# Patient Record
Sex: Female | Born: 1940
Health system: Southern US, Community
[De-identification: ages and names within clinical notes are randomized; demographics above are authoritative.]

## PROBLEM LIST (undated history)

## (undated) DIAGNOSIS — I10 Essential (primary) hypertension: Secondary | ICD-10-CM

## (undated) DIAGNOSIS — H409 Unspecified glaucoma: Secondary | ICD-10-CM

## (undated) DIAGNOSIS — N183 Chronic kidney disease, stage 3 unspecified: Secondary | ICD-10-CM

## (undated) DIAGNOSIS — C649 Malignant neoplasm of unspecified kidney, except renal pelvis: Secondary | ICD-10-CM

## (undated) DIAGNOSIS — D126 Benign neoplasm of colon, unspecified: Secondary | ICD-10-CM

## (undated) DIAGNOSIS — E039 Hypothyroidism, unspecified: Secondary | ICD-10-CM

## (undated) DIAGNOSIS — E662 Morbid (severe) obesity with alveolar hypoventilation: Secondary | ICD-10-CM

## (undated) DIAGNOSIS — E669 Obesity, unspecified: Secondary | ICD-10-CM

## (undated) DIAGNOSIS — F32A Depression, unspecified: Secondary | ICD-10-CM

## (undated) DIAGNOSIS — F329 Major depressive disorder, single episode, unspecified: Secondary | ICD-10-CM

## (undated) HISTORY — DX: Major depressive disorder, single episode, unspecified: F32.9

## (undated) HISTORY — DX: Hypothyroidism, unspecified: E03.9

## (undated) HISTORY — PX: TOTAL ABDOMINAL HYSTERECTOMY: SHX209

## (undated) HISTORY — DX: Morbid (severe) obesity with alveolar hypoventilation: E66.2

## (undated) HISTORY — DX: Benign neoplasm of colon, unspecified: D12.6

## (undated) HISTORY — PX: JOINT REPLACEMENT: SHX530

## (undated) HISTORY — DX: Depression, unspecified: F32.A

## (undated) HISTORY — DX: Chronic kidney disease, stage 3 unspecified: N18.30

## (undated) HISTORY — DX: Obesity, unspecified: E66.9

## (undated) HISTORY — PX: NEPHRECTOMY: SHX65

## (undated) HISTORY — DX: Unspecified glaucoma: H40.9

## (undated) HISTORY — DX: Chronic kidney disease, stage 3 (moderate): N18.3

## (undated) HISTORY — PX: KIDNEY SURGERY: SHX687

## (undated) HISTORY — DX: Malignant neoplasm of unspecified kidney, except renal pelvis: C64.9

## (undated) HISTORY — DX: Essential (primary) hypertension: I10

## (undated) HISTORY — PX: REPLACEMENT TOTAL KNEE: SUR1224

---

## 2004-01-15 ENCOUNTER — Ambulatory Visit (HOSPITAL_COMMUNITY): Admission: RE | Admit: 2004-01-15 | Discharge: 2004-01-15 | Payer: Self-pay | Admitting: Neurosurgery

## 2004-01-26 ENCOUNTER — Encounter: Admission: RE | Admit: 2004-01-26 | Discharge: 2004-01-26 | Payer: Self-pay | Admitting: Neurosurgery

## 2004-02-09 ENCOUNTER — Encounter: Admission: RE | Admit: 2004-02-09 | Discharge: 2004-02-09 | Payer: Self-pay | Admitting: Neurosurgery

## 2004-04-12 ENCOUNTER — Encounter: Admission: RE | Admit: 2004-04-12 | Discharge: 2004-04-12 | Payer: Self-pay | Admitting: Neurosurgery

## 2004-06-07 ENCOUNTER — Ambulatory Visit (HOSPITAL_COMMUNITY): Admission: RE | Admit: 2004-06-07 | Discharge: 2004-06-07 | Payer: Self-pay | Admitting: Family Medicine

## 2004-06-08 ENCOUNTER — Emergency Department (HOSPITAL_COMMUNITY): Admission: EM | Admit: 2004-06-08 | Discharge: 2004-06-09 | Payer: Self-pay | Admitting: Emergency Medicine

## 2004-06-10 ENCOUNTER — Emergency Department (HOSPITAL_COMMUNITY): Admission: EM | Admit: 2004-06-10 | Discharge: 2004-06-10 | Payer: Self-pay | Admitting: Emergency Medicine

## 2006-02-06 ENCOUNTER — Encounter: Admission: RE | Admit: 2006-02-06 | Discharge: 2006-02-06 | Payer: Self-pay | Admitting: Orthopedic Surgery

## 2006-07-06 ENCOUNTER — Emergency Department (HOSPITAL_COMMUNITY): Admission: EM | Admit: 2006-07-06 | Discharge: 2006-07-06 | Payer: Self-pay | Admitting: Emergency Medicine

## 2006-07-15 ENCOUNTER — Encounter: Admission: RE | Admit: 2006-07-15 | Discharge: 2006-07-15 | Payer: Self-pay | Admitting: Neurosurgery

## 2006-07-18 ENCOUNTER — Encounter: Admission: RE | Admit: 2006-07-18 | Discharge: 2006-07-18 | Payer: Self-pay | Admitting: Neurosurgery

## 2006-07-24 ENCOUNTER — Ambulatory Visit: Payer: Self-pay | Admitting: Hematology and Oncology

## 2006-07-28 LAB — CBC WITH DIFFERENTIAL/PLATELET
BASO%: 1.1 % (ref 0.0–2.0)
Basophils Absolute: 0.1 10*3/uL (ref 0.0–0.1)
EOS%: 2.5 % (ref 0.0–7.0)
Eosinophils Absolute: 0.2 10*3/uL (ref 0.0–0.5)
HCT: 43.1 % (ref 34.8–46.6)
HGB: 14.4 g/dL (ref 11.6–15.9)
LYMPH%: 38.8 % (ref 14.0–48.0)
MCH: 30.9 pg (ref 26.0–34.0)
MCHC: 33.4 g/dL (ref 32.0–36.0)
MCV: 92.3 fL (ref 81.0–101.0)
MONO#: 0.5 10*3/uL (ref 0.1–0.9)
MONO%: 7.2 % (ref 0.0–13.0)
NEUT#: 3.3 10*3/uL (ref 1.5–6.5)
NEUT%: 50.4 % (ref 39.6–76.8)
Platelets: 319 10*3/uL (ref 145–400)
RBC: 4.67 10*6/uL (ref 3.70–5.32)
RDW: 13.8 % (ref 11.3–14.5)
WBC: 6.5 10*3/uL (ref 3.9–10.0)
lymph#: 2.5 10*3/uL (ref 0.9–3.3)

## 2006-07-28 LAB — COMPREHENSIVE METABOLIC PANEL WITH GFR
ALT: 18 U/L (ref 0–40)
AST: 16 U/L (ref 0–37)
Albumin: 4.3 g/dL (ref 3.5–5.2)
Alkaline Phosphatase: 72 U/L (ref 39–117)
BUN: 19 mg/dL (ref 6–23)
CO2: 24 meq/L (ref 19–32)
Calcium: 9.6 mg/dL (ref 8.4–10.5)
Chloride: 107 meq/L (ref 96–112)
Creatinine, Ser: 0.86 mg/dL (ref 0.40–1.20)
Glucose, Bld: 142 mg/dL — ABNORMAL HIGH (ref 70–99)
Potassium: 4 meq/L (ref 3.5–5.3)
Sodium: 142 meq/L (ref 135–145)
Total Bilirubin: 0.5 mg/dL (ref 0.3–1.2)
Total Protein: 6.7 g/dL (ref 6.0–8.3)

## 2006-07-28 LAB — URINALYSIS, MICROSCOPIC - CHCC
Bilirubin (Urine): NEGATIVE
Blood: NEGATIVE
Glucose: NEGATIVE g/dL
RBC count: NEGATIVE (ref 0–2)

## 2007-04-18 ENCOUNTER — Ambulatory Visit: Payer: Self-pay | Admitting: Hematology & Oncology

## 2007-09-27 ENCOUNTER — Ambulatory Visit: Payer: Self-pay | Admitting: Hematology & Oncology

## 2007-10-03 ENCOUNTER — Encounter: Admission: RE | Admit: 2007-10-03 | Discharge: 2007-10-03 | Payer: Self-pay | Admitting: Hematology & Oncology

## 2007-11-01 ENCOUNTER — Emergency Department (HOSPITAL_COMMUNITY): Admission: EM | Admit: 2007-11-01 | Discharge: 2007-11-01 | Payer: Self-pay | Admitting: Emergency Medicine

## 2008-06-01 ENCOUNTER — Encounter: Admission: RE | Admit: 2008-06-01 | Discharge: 2008-06-01 | Payer: Self-pay | Admitting: Neurosurgery

## 2008-07-03 ENCOUNTER — Encounter: Admission: RE | Admit: 2008-07-03 | Discharge: 2008-07-03 | Payer: Self-pay | Admitting: Neurosurgery

## 2009-01-06 ENCOUNTER — Encounter: Admission: RE | Admit: 2009-01-06 | Discharge: 2009-01-06 | Payer: Self-pay | Admitting: Neurosurgery

## 2009-03-30 ENCOUNTER — Encounter: Admission: RE | Admit: 2009-03-30 | Discharge: 2009-03-30 | Payer: Self-pay | Admitting: Neurosurgery

## 2009-05-13 ENCOUNTER — Encounter: Admission: RE | Admit: 2009-05-13 | Discharge: 2009-05-13 | Payer: Self-pay | Admitting: Neurosurgery

## 2009-12-29 ENCOUNTER — Ambulatory Visit: Payer: Self-pay | Admitting: Hematology & Oncology

## 2009-12-30 LAB — COMPREHENSIVE METABOLIC PANEL
ALT: 16 U/L (ref 0–35)
AST: 15 U/L (ref 0–37)
Albumin: 4.1 g/dL (ref 3.5–5.2)
CO2: 23 mEq/L (ref 19–32)
Calcium: 9.4 mg/dL (ref 8.4–10.5)
Chloride: 106 mEq/L (ref 96–112)
Potassium: 4.6 mEq/L (ref 3.5–5.3)
Sodium: 142 mEq/L (ref 135–145)
Total Protein: 6.8 g/dL (ref 6.0–8.3)

## 2009-12-30 LAB — CBC WITH DIFFERENTIAL (CANCER CENTER ONLY)
BASO%: 0.5 % (ref 0.0–2.0)
EOS%: 4.2 % (ref 0.0–7.0)
HCT: 44.1 % (ref 34.8–46.6)
LYMPH#: 2.4 10*3/uL (ref 0.9–3.3)
MCHC: 32.9 g/dL (ref 32.0–36.0)
MONO#: 0.4 10*3/uL (ref 0.1–0.9)
NEUT#: 3.3 10*3/uL (ref 1.5–6.5)
NEUT%: 52.4 % (ref 39.6–80.0)
RDW: 12.4 % (ref 10.5–14.6)
WBC: 6.4 10*3/uL (ref 3.9–10.0)

## 2009-12-30 LAB — LACTATE DEHYDROGENASE: LDH: 131 U/L (ref 94–250)

## 2010-01-01 ENCOUNTER — Inpatient Hospital Stay (HOSPITAL_COMMUNITY): Admission: EM | Admit: 2010-01-01 | Discharge: 2010-01-07 | Payer: Self-pay | Admitting: Emergency Medicine

## 2010-12-05 ENCOUNTER — Encounter: Payer: Self-pay | Admitting: Neurosurgery

## 2010-12-05 ENCOUNTER — Encounter: Payer: Self-pay | Admitting: Orthopedic Surgery

## 2010-12-06 ENCOUNTER — Encounter: Payer: Self-pay | Admitting: Neurosurgery

## 2011-01-18 ENCOUNTER — Other Ambulatory Visit: Payer: Self-pay | Admitting: Neurosurgery

## 2011-01-18 DIAGNOSIS — M542 Cervicalgia: Secondary | ICD-10-CM

## 2011-01-18 DIAGNOSIS — M545 Low back pain: Secondary | ICD-10-CM

## 2011-01-28 ENCOUNTER — Ambulatory Visit
Admission: RE | Admit: 2011-01-28 | Discharge: 2011-01-28 | Disposition: A | Discharge status: Home / Self Care | Payer: Medicare Other | Source: Ambulatory Visit | Attending: Neurosurgery | Admitting: Neurosurgery

## 2011-01-28 DIAGNOSIS — M545 Low back pain: Secondary | ICD-10-CM

## 2011-01-28 DIAGNOSIS — M542 Cervicalgia: Secondary | ICD-10-CM

## 2011-02-03 LAB — CBC
HCT: 32.7 % — ABNORMAL LOW (ref 36.0–46.0)
HCT: 39.9 % (ref 36.0–46.0)
Hemoglobin: 10 g/dL — ABNORMAL LOW (ref 12.0–15.0)
Hemoglobin: 10.9 g/dL — ABNORMAL LOW (ref 12.0–15.0)
Hemoglobin: 11.5 g/dL — ABNORMAL LOW (ref 12.0–15.0)
MCHC: 33.2 g/dL (ref 30.0–36.0)
MCHC: 33.3 g/dL (ref 30.0–36.0)
MCV: 93.6 fL (ref 78.0–100.0)
Platelets: 222 10*3/uL (ref 150–400)
Platelets: 242 10*3/uL (ref 150–400)
RBC: 3.49 MIL/uL — ABNORMAL LOW (ref 3.87–5.11)
RBC: 3.66 MIL/uL — ABNORMAL LOW (ref 3.87–5.11)
RDW: 13.4 % (ref 11.5–15.5)
RDW: 14 % (ref 11.5–15.5)
RDW: 14.1 % (ref 11.5–15.5)
WBC: 12.1 10*3/uL — ABNORMAL HIGH (ref 4.0–10.5)
WBC: 13.8 10*3/uL — ABNORMAL HIGH (ref 4.0–10.5)

## 2011-02-03 LAB — BASIC METABOLIC PANEL
BUN: 12 mg/dL (ref 6–23)
BUN: 14 mg/dL (ref 6–23)
CO2: 26 mEq/L (ref 19–32)
CO2: 26 mEq/L (ref 19–32)
Calcium: 8.3 mg/dL — ABNORMAL LOW (ref 8.4–10.5)
Calcium: 8.4 mg/dL (ref 8.4–10.5)
Creatinine, Ser: 0.77 mg/dL (ref 0.4–1.2)
GFR calc non Af Amer: >60 mL/min (ref >60)
GFR calc non Af Amer: >60 mL/min (ref >60)
Glucose, Bld: 108 mg/dL — ABNORMAL HIGH (ref 70–99)
Glucose, Bld: 129 mg/dL — ABNORMAL HIGH (ref 70–99)
Glucose, Bld: 145 mg/dL — ABNORMAL HIGH (ref 70–99)
Potassium: 4.3 mEq/L (ref 3.5–5.1)
Potassium: 4.5 mEq/L (ref 3.5–5.1)
Potassium: 4.6 mEq/L (ref 3.5–5.1)
Sodium: 132 mEq/L — ABNORMAL LOW (ref 135–145)
Sodium: 137 mEq/L (ref 135–145)

## 2011-02-03 LAB — POCT I-STAT, CHEM 8
Calcium, Ion: 1.15 mmol/L (ref 1.12–1.32)
Chloride: 109 mEq/L (ref 96–112)
Glucose, Bld: 148 mg/dL — ABNORMAL HIGH (ref 70–99)
HCT: 48 % — ABNORMAL HIGH (ref 36.0–46.0)
TCO2: 26 mmol/L (ref 0–100)

## 2011-02-03 LAB — DIFFERENTIAL
Basophils Absolute: 0 10*3/uL (ref 0.0–0.1)
Eosinophils Absolute: 0 10*3/uL (ref 0.0–0.7)
Eosinophils Relative: 0 % (ref 0–5)
Lymphocytes Relative: 5 % — ABNORMAL LOW (ref 12–46)
Lymphs Abs: 0.7 10*3/uL (ref 0.7–4.0)
Neutrophils Relative %: 92 % — ABNORMAL HIGH (ref 43–77)

## 2011-04-04 ENCOUNTER — Ambulatory Visit: Payer: Medicare Other | Admitting: Physical Therapy

## 2011-04-05 ENCOUNTER — Ambulatory Visit: Payer: Medicare Other | Attending: Orthopedic Surgery | Admitting: Physical Therapy

## 2011-04-05 DIAGNOSIS — M546 Pain in thoracic spine: Secondary | ICD-10-CM | POA: Insufficient documentation

## 2011-04-05 DIAGNOSIS — IMO0001 Reserved for inherently not codable concepts without codable children: Secondary | ICD-10-CM | POA: Insufficient documentation

## 2011-04-05 DIAGNOSIS — M25669 Stiffness of unspecified knee, not elsewhere classified: Secondary | ICD-10-CM | POA: Insufficient documentation

## 2011-04-05 DIAGNOSIS — Z96659 Presence of unspecified artificial knee joint: Secondary | ICD-10-CM | POA: Insufficient documentation

## 2011-04-05 DIAGNOSIS — M25569 Pain in unspecified knee: Secondary | ICD-10-CM | POA: Insufficient documentation

## 2011-04-06 ENCOUNTER — Ambulatory Visit: Payer: Medicare Other | Admitting: Rehabilitation

## 2011-04-08 ENCOUNTER — Ambulatory Visit: Payer: Medicare Other | Admitting: Physical Therapy

## 2011-04-12 ENCOUNTER — Ambulatory Visit: Payer: Medicare Other | Admitting: Rehabilitation

## 2011-04-12 ENCOUNTER — Ambulatory Visit: Payer: Medicare Other | Admitting: Physical Therapy

## 2011-04-14 ENCOUNTER — Ambulatory Visit: Payer: Medicare Other | Admitting: Physical Therapy

## 2011-04-15 ENCOUNTER — Encounter: Payer: Medicare Other | Admitting: Physical Therapy

## 2011-04-15 ENCOUNTER — Ambulatory Visit: Payer: Medicare Other | Attending: Neurosurgery | Admitting: Physical Therapy

## 2011-04-15 DIAGNOSIS — M25569 Pain in unspecified knee: Secondary | ICD-10-CM | POA: Insufficient documentation

## 2011-04-15 DIAGNOSIS — M25669 Stiffness of unspecified knee, not elsewhere classified: Secondary | ICD-10-CM | POA: Insufficient documentation

## 2011-04-15 DIAGNOSIS — IMO0001 Reserved for inherently not codable concepts without codable children: Secondary | ICD-10-CM | POA: Insufficient documentation

## 2011-04-15 DIAGNOSIS — M546 Pain in thoracic spine: Secondary | ICD-10-CM | POA: Insufficient documentation

## 2011-04-15 DIAGNOSIS — Z96659 Presence of unspecified artificial knee joint: Secondary | ICD-10-CM | POA: Insufficient documentation

## 2011-04-18 ENCOUNTER — Ambulatory Visit: Payer: Medicare Other | Admitting: Physical Therapy

## 2011-04-18 DIAGNOSIS — M2569 Stiffness of other specified joint, not elsewhere classified: Secondary | ICD-10-CM | POA: Insufficient documentation

## 2011-04-18 DIAGNOSIS — IMO0001 Reserved for inherently not codable concepts without codable children: Secondary | ICD-10-CM | POA: Insufficient documentation

## 2011-04-18 DIAGNOSIS — M542 Cervicalgia: Secondary | ICD-10-CM | POA: Insufficient documentation

## 2011-04-18 DIAGNOSIS — M545 Low back pain, unspecified: Secondary | ICD-10-CM | POA: Insufficient documentation

## 2011-04-20 ENCOUNTER — Ambulatory Visit: Payer: Medicare Other | Admitting: Physical Therapy

## 2011-04-22 ENCOUNTER — Ambulatory Visit: Payer: Medicare Other | Admitting: Physical Therapy

## 2011-04-22 DIAGNOSIS — Z96659 Presence of unspecified artificial knee joint: Secondary | ICD-10-CM | POA: Insufficient documentation

## 2011-04-22 DIAGNOSIS — M545 Low back pain, unspecified: Secondary | ICD-10-CM | POA: Insufficient documentation

## 2011-04-22 DIAGNOSIS — M25669 Stiffness of unspecified knee, not elsewhere classified: Secondary | ICD-10-CM | POA: Insufficient documentation

## 2011-04-22 DIAGNOSIS — IMO0001 Reserved for inherently not codable concepts without codable children: Secondary | ICD-10-CM | POA: Insufficient documentation

## 2011-04-22 DIAGNOSIS — M542 Cervicalgia: Secondary | ICD-10-CM | POA: Insufficient documentation

## 2011-04-22 DIAGNOSIS — M25569 Pain in unspecified knee: Secondary | ICD-10-CM | POA: Insufficient documentation

## 2011-04-22 DIAGNOSIS — M2569 Stiffness of other specified joint, not elsewhere classified: Secondary | ICD-10-CM | POA: Insufficient documentation

## 2011-04-22 DIAGNOSIS — M546 Pain in thoracic spine: Secondary | ICD-10-CM | POA: Insufficient documentation

## 2011-04-25 ENCOUNTER — Ambulatory Visit: Payer: Medicare Other | Admitting: Physical Therapy

## 2011-04-25 DIAGNOSIS — M2569 Stiffness of other specified joint, not elsewhere classified: Secondary | ICD-10-CM | POA: Insufficient documentation

## 2011-04-25 DIAGNOSIS — M545 Low back pain, unspecified: Secondary | ICD-10-CM | POA: Insufficient documentation

## 2011-04-25 DIAGNOSIS — M542 Cervicalgia: Secondary | ICD-10-CM | POA: Insufficient documentation

## 2011-04-25 DIAGNOSIS — IMO0001 Reserved for inherently not codable concepts without codable children: Secondary | ICD-10-CM | POA: Insufficient documentation

## 2011-04-27 ENCOUNTER — Ambulatory Visit: Payer: Medicare Other | Admitting: Physical Therapy

## 2011-04-27 ENCOUNTER — Encounter: Payer: Self-pay | Admitting: Physical Therapy

## 2011-04-29 ENCOUNTER — Ambulatory Visit: Payer: Medicare Other | Admitting: Physical Therapy

## 2011-04-29 ENCOUNTER — Encounter: Payer: Self-pay | Admitting: Physical Therapy

## 2011-05-02 ENCOUNTER — Encounter: Payer: Medicare Other | Admitting: Physical Therapy

## 2011-05-02 ENCOUNTER — Ambulatory Visit: Payer: Medicare Other | Admitting: Physical Therapy

## 2011-05-03 ENCOUNTER — Ambulatory Visit: Payer: Medicare Other | Admitting: Physical Therapy

## 2011-05-04 ENCOUNTER — Encounter: Payer: Medicare Other | Admitting: Physical Therapy

## 2011-05-06 ENCOUNTER — Ambulatory Visit: Payer: Medicare Other | Admitting: Physical Therapy

## 2011-05-06 DIAGNOSIS — M545 Low back pain, unspecified: Secondary | ICD-10-CM | POA: Insufficient documentation

## 2011-05-06 DIAGNOSIS — M2569 Stiffness of other specified joint, not elsewhere classified: Secondary | ICD-10-CM | POA: Insufficient documentation

## 2011-05-06 DIAGNOSIS — IMO0001 Reserved for inherently not codable concepts without codable children: Secondary | ICD-10-CM | POA: Insufficient documentation

## 2011-05-06 DIAGNOSIS — M542 Cervicalgia: Secondary | ICD-10-CM | POA: Insufficient documentation

## 2011-11-28 DIAGNOSIS — T84498A Other mechanical complication of other internal orthopedic devices, implants and grafts, initial encounter: Secondary | ICD-10-CM | POA: Diagnosis not present

## 2011-12-12 DIAGNOSIS — I10 Essential (primary) hypertension: Secondary | ICD-10-CM | POA: Diagnosis not present

## 2012-01-06 DIAGNOSIS — H35359 Cystoid macular degeneration, unspecified eye: Secondary | ICD-10-CM | POA: Diagnosis not present

## 2012-01-09 DIAGNOSIS — K648 Other hemorrhoids: Secondary | ICD-10-CM | POA: Diagnosis not present

## 2012-01-09 DIAGNOSIS — D126 Benign neoplasm of colon, unspecified: Secondary | ICD-10-CM | POA: Diagnosis not present

## 2012-01-09 DIAGNOSIS — Z86010 Personal history of colonic polyps: Secondary | ICD-10-CM | POA: Diagnosis not present

## 2012-01-09 DIAGNOSIS — Z1211 Encounter for screening for malignant neoplasm of colon: Secondary | ICD-10-CM | POA: Diagnosis not present

## 2012-01-30 DIAGNOSIS — D500 Iron deficiency anemia secondary to blood loss (chronic): Secondary | ICD-10-CM | POA: Diagnosis not present

## 2012-01-30 DIAGNOSIS — E039 Hypothyroidism, unspecified: Secondary | ICD-10-CM | POA: Diagnosis not present

## 2012-01-30 DIAGNOSIS — B372 Candidiasis of skin and nail: Secondary | ICD-10-CM | POA: Diagnosis not present

## 2012-01-30 DIAGNOSIS — L538 Other specified erythematous conditions: Secondary | ICD-10-CM | POA: Diagnosis not present

## 2012-01-30 DIAGNOSIS — L659 Nonscarring hair loss, unspecified: Secondary | ICD-10-CM | POA: Diagnosis not present

## 2012-02-14 DIAGNOSIS — I10 Essential (primary) hypertension: Secondary | ICD-10-CM | POA: Diagnosis not present

## 2012-03-12 DIAGNOSIS — B372 Candidiasis of skin and nail: Secondary | ICD-10-CM | POA: Diagnosis not present

## 2012-03-12 DIAGNOSIS — E039 Hypothyroidism, unspecified: Secondary | ICD-10-CM | POA: Diagnosis not present

## 2012-03-12 DIAGNOSIS — L259 Unspecified contact dermatitis, unspecified cause: Secondary | ICD-10-CM | POA: Diagnosis not present

## 2012-09-20 DIAGNOSIS — I10 Essential (primary) hypertension: Secondary | ICD-10-CM | POA: Diagnosis not present

## 2012-09-20 DIAGNOSIS — Z23 Encounter for immunization: Secondary | ICD-10-CM | POA: Diagnosis not present

## 2012-09-20 DIAGNOSIS — R109 Unspecified abdominal pain: Secondary | ICD-10-CM | POA: Diagnosis not present

## 2012-09-20 DIAGNOSIS — N183 Chronic kidney disease, stage 3 unspecified: Secondary | ICD-10-CM | POA: Diagnosis not present

## 2012-10-02 DIAGNOSIS — H409 Unspecified glaucoma: Secondary | ICD-10-CM | POA: Diagnosis not present

## 2012-10-02 DIAGNOSIS — H2510 Age-related nuclear cataract, unspecified eye: Secondary | ICD-10-CM | POA: Diagnosis not present

## 2012-10-02 DIAGNOSIS — H2512 Age-related nuclear cataract, left eye: Secondary | ICD-10-CM | POA: Insufficient documentation

## 2012-10-02 DIAGNOSIS — H401490 Capsular glaucoma with pseudoexfoliation of lens, unspecified eye, stage unspecified: Secondary | ICD-10-CM | POA: Insufficient documentation

## 2012-12-04 DIAGNOSIS — S63509A Unspecified sprain of unspecified wrist, initial encounter: Secondary | ICD-10-CM | POA: Diagnosis not present

## 2012-12-04 DIAGNOSIS — M25469 Effusion, unspecified knee: Secondary | ICD-10-CM | POA: Diagnosis not present

## 2012-12-04 DIAGNOSIS — S8000XA Contusion of unspecified knee, initial encounter: Secondary | ICD-10-CM | POA: Diagnosis not present

## 2012-12-04 DIAGNOSIS — M19049 Primary osteoarthritis, unspecified hand: Secondary | ICD-10-CM | POA: Diagnosis not present

## 2013-02-20 DIAGNOSIS — I10 Essential (primary) hypertension: Secondary | ICD-10-CM | POA: Diagnosis not present

## 2013-02-20 DIAGNOSIS — R05 Cough: Secondary | ICD-10-CM | POA: Diagnosis not present

## 2013-02-20 DIAGNOSIS — R059 Cough, unspecified: Secondary | ICD-10-CM | POA: Diagnosis not present

## 2013-05-02 DIAGNOSIS — R5381 Other malaise: Secondary | ICD-10-CM | POA: Diagnosis not present

## 2013-05-02 DIAGNOSIS — R059 Cough, unspecified: Secondary | ICD-10-CM | POA: Diagnosis not present

## 2013-05-02 DIAGNOSIS — R05 Cough: Secondary | ICD-10-CM | POA: Diagnosis not present

## 2013-05-28 DIAGNOSIS — R0982 Postnasal drip: Secondary | ICD-10-CM | POA: Diagnosis not present

## 2013-05-28 DIAGNOSIS — R05 Cough: Secondary | ICD-10-CM | POA: Diagnosis not present

## 2013-05-30 DIAGNOSIS — H029 Unspecified disorder of eyelid: Secondary | ICD-10-CM | POA: Diagnosis not present

## 2013-05-30 DIAGNOSIS — H02839 Dermatochalasis of unspecified eye, unspecified eyelid: Secondary | ICD-10-CM | POA: Diagnosis not present

## 2013-05-30 DIAGNOSIS — H5340 Unspecified visual field defects: Secondary | ICD-10-CM | POA: Diagnosis not present

## 2013-05-30 DIAGNOSIS — L918 Other hypertrophic disorders of the skin: Secondary | ICD-10-CM | POA: Diagnosis not present

## 2013-08-28 DIAGNOSIS — M25569 Pain in unspecified knee: Secondary | ICD-10-CM | POA: Diagnosis not present

## 2013-08-28 DIAGNOSIS — M545 Low back pain: Secondary | ICD-10-CM | POA: Diagnosis not present

## 2013-08-29 ENCOUNTER — Other Ambulatory Visit: Payer: Self-pay | Admitting: Neurosurgery

## 2013-08-29 DIAGNOSIS — M4800 Spinal stenosis, site unspecified: Secondary | ICD-10-CM | POA: Diagnosis not present

## 2013-08-29 DIAGNOSIS — G44309 Post-traumatic headache, unspecified, not intractable: Secondary | ICD-10-CM

## 2013-08-29 DIAGNOSIS — M4310 Spondylolisthesis, site unspecified: Secondary | ICD-10-CM

## 2013-08-29 DIAGNOSIS — M4120 Other idiopathic scoliosis, site unspecified: Secondary | ICD-10-CM | POA: Diagnosis not present

## 2013-09-06 DIAGNOSIS — M545 Low back pain: Secondary | ICD-10-CM | POA: Diagnosis not present

## 2013-09-08 ENCOUNTER — Ambulatory Visit
Admission: RE | Admit: 2013-09-08 | Discharge: 2013-09-08 | Disposition: A | Discharge status: Home / Self Care | Payer: Medicare Other | Source: Ambulatory Visit | Attending: Neurosurgery | Admitting: Neurosurgery

## 2013-09-08 DIAGNOSIS — G44309 Post-traumatic headache, unspecified, not intractable: Secondary | ICD-10-CM

## 2013-09-08 DIAGNOSIS — IMO0002 Reserved for concepts with insufficient information to code with codable children: Secondary | ICD-10-CM | POA: Diagnosis not present

## 2013-09-08 DIAGNOSIS — M4310 Spondylolisthesis, site unspecified: Secondary | ICD-10-CM

## 2013-09-08 DIAGNOSIS — M545 Low back pain: Secondary | ICD-10-CM | POA: Diagnosis not present

## 2013-09-08 DIAGNOSIS — S0990XA Unspecified injury of head, initial encounter: Secondary | ICD-10-CM | POA: Diagnosis not present

## 2013-09-09 ENCOUNTER — Ambulatory Visit
Admission: RE | Admit: 2013-09-09 | Discharge: 2013-09-09 | Disposition: A | Discharge status: Home / Self Care | Payer: Medicare Other | Source: Ambulatory Visit | Attending: Neurosurgery | Admitting: Neurosurgery

## 2013-09-09 ENCOUNTER — Other Ambulatory Visit: Payer: Self-pay | Admitting: Neurosurgery

## 2013-09-09 DIAGNOSIS — Z23 Encounter for immunization: Secondary | ICD-10-CM | POA: Diagnosis not present

## 2013-09-09 DIAGNOSIS — M4120 Other idiopathic scoliosis, site unspecified: Secondary | ICD-10-CM | POA: Diagnosis not present

## 2013-09-09 DIAGNOSIS — M47817 Spondylosis without myelopathy or radiculopathy, lumbosacral region: Secondary | ICD-10-CM | POA: Diagnosis not present

## 2013-09-09 DIAGNOSIS — G44309 Post-traumatic headache, unspecified, not intractable: Secondary | ICD-10-CM

## 2013-09-09 DIAGNOSIS — I10 Essential (primary) hypertension: Secondary | ICD-10-CM | POA: Diagnosis not present

## 2013-09-09 DIAGNOSIS — N183 Chronic kidney disease, stage 3 unspecified: Secondary | ICD-10-CM | POA: Diagnosis not present

## 2013-09-09 DIAGNOSIS — M4310 Spondylolisthesis, site unspecified: Secondary | ICD-10-CM

## 2013-09-09 DIAGNOSIS — Z1331 Encounter for screening for depression: Secondary | ICD-10-CM | POA: Diagnosis not present

## 2013-09-10 DIAGNOSIS — M4120 Other idiopathic scoliosis, site unspecified: Secondary | ICD-10-CM | POA: Diagnosis not present

## 2013-09-10 DIAGNOSIS — M4310 Spondylolisthesis, site unspecified: Secondary | ICD-10-CM | POA: Diagnosis not present

## 2013-09-11 ENCOUNTER — Other Ambulatory Visit: Payer: Self-pay | Admitting: Neurosurgery

## 2013-09-11 DIAGNOSIS — M419 Scoliosis, unspecified: Secondary | ICD-10-CM

## 2013-09-12 ENCOUNTER — Other Ambulatory Visit: Payer: Medicare Other

## 2013-09-13 ENCOUNTER — Other Ambulatory Visit: Payer: Self-pay | Admitting: Neurosurgery

## 2013-09-13 ENCOUNTER — Ambulatory Visit
Admission: RE | Admit: 2013-09-13 | Discharge: 2013-09-13 | Disposition: A | Discharge status: Home / Self Care | Payer: Medicare Other | Source: Ambulatory Visit | Attending: Neurosurgery | Admitting: Neurosurgery

## 2013-09-13 VITALS — BP 162/69 | HR 57

## 2013-09-13 DIAGNOSIS — M545 Low back pain: Secondary | ICD-10-CM | POA: Diagnosis not present

## 2013-09-13 DIAGNOSIS — M419 Scoliosis, unspecified: Secondary | ICD-10-CM

## 2013-09-13 DIAGNOSIS — M47816 Spondylosis without myelopathy or radiculopathy, lumbar region: Secondary | ICD-10-CM

## 2013-09-13 MED ORDER — IOHEXOL 180 MG/ML  SOLN
1.0000 mL | Freq: Once | INTRAMUSCULAR | Status: AC | PRN
  Administered 2013-09-13: 1 mL via INTRA_ARTICULAR

## 2013-09-13 MED ORDER — METHYLPREDNISOLONE ACETATE 40 MG/ML INJ SUSP (RADIOLOG
120.0000 mg | Freq: Once | INTRAMUSCULAR | Status: AC
  Administered 2013-09-13: 120 mg via INTRA_ARTICULAR

## 2013-10-15 DIAGNOSIS — H401490 Capsular glaucoma with pseudoexfoliation of lens, unspecified eye, stage unspecified: Secondary | ICD-10-CM | POA: Diagnosis not present

## 2013-10-15 DIAGNOSIS — H4040X0 Glaucoma secondary to eye inflammation, unspecified eye, stage unspecified: Secondary | ICD-10-CM | POA: Diagnosis not present

## 2013-10-15 DIAGNOSIS — H2510 Age-related nuclear cataract, unspecified eye: Secondary | ICD-10-CM | POA: Diagnosis not present

## 2013-10-15 DIAGNOSIS — H409 Unspecified glaucoma: Secondary | ICD-10-CM | POA: Diagnosis not present

## 2013-10-15 DIAGNOSIS — Z961 Presence of intraocular lens: Secondary | ICD-10-CM | POA: Diagnosis not present

## 2013-10-31 ENCOUNTER — Other Ambulatory Visit: Payer: Self-pay | Admitting: Neurosurgery

## 2013-10-31 DIAGNOSIS — M4800 Spinal stenosis, site unspecified: Secondary | ICD-10-CM | POA: Diagnosis not present

## 2013-10-31 DIAGNOSIS — E669 Obesity, unspecified: Secondary | ICD-10-CM | POA: Diagnosis not present

## 2013-10-31 DIAGNOSIS — M4310 Spondylolisthesis, site unspecified: Secondary | ICD-10-CM | POA: Diagnosis not present

## 2013-10-31 DIAGNOSIS — I10 Essential (primary) hypertension: Secondary | ICD-10-CM | POA: Diagnosis not present

## 2013-11-13 ENCOUNTER — Other Ambulatory Visit: Payer: Medicare Other

## 2013-11-21 ENCOUNTER — Other Ambulatory Visit: Payer: Self-pay | Admitting: Internal Medicine

## 2013-11-21 DIAGNOSIS — J329 Chronic sinusitis, unspecified: Secondary | ICD-10-CM

## 2013-11-21 DIAGNOSIS — R5383 Other fatigue: Secondary | ICD-10-CM | POA: Diagnosis not present

## 2013-11-21 DIAGNOSIS — L918 Other hypertrophic disorders of the skin: Secondary | ICD-10-CM | POA: Diagnosis not present

## 2013-11-21 DIAGNOSIS — R109 Unspecified abdominal pain: Secondary | ICD-10-CM | POA: Diagnosis not present

## 2013-11-21 DIAGNOSIS — H02839 Dermatochalasis of unspecified eye, unspecified eyelid: Secondary | ICD-10-CM | POA: Diagnosis not present

## 2013-11-21 DIAGNOSIS — H029 Unspecified disorder of eyelid: Secondary | ICD-10-CM | POA: Diagnosis not present

## 2013-11-21 DIAGNOSIS — L908 Other atrophic disorders of skin: Secondary | ICD-10-CM | POA: Diagnosis not present

## 2013-11-21 DIAGNOSIS — H5340 Unspecified visual field defects: Secondary | ICD-10-CM | POA: Diagnosis not present

## 2013-11-21 DIAGNOSIS — R5381 Other malaise: Secondary | ICD-10-CM | POA: Diagnosis not present

## 2013-11-22 ENCOUNTER — Other Ambulatory Visit: Payer: Medicare Other

## 2013-11-25 ENCOUNTER — Ambulatory Visit
Admission: RE | Admit: 2013-11-25 | Discharge: 2013-11-25 | Disposition: A | Discharge status: Home / Self Care | Payer: Medicare Other | Source: Ambulatory Visit | Attending: Internal Medicine | Admitting: Internal Medicine

## 2013-11-25 DIAGNOSIS — J3489 Other specified disorders of nose and nasal sinuses: Secondary | ICD-10-CM | POA: Diagnosis not present

## 2013-11-25 DIAGNOSIS — J329 Chronic sinusitis, unspecified: Secondary | ICD-10-CM

## 2013-12-03 DIAGNOSIS — R5381 Other malaise: Secondary | ICD-10-CM | POA: Diagnosis not present

## 2013-12-03 DIAGNOSIS — J3489 Other specified disorders of nose and nasal sinuses: Secondary | ICD-10-CM | POA: Diagnosis not present

## 2013-12-03 DIAGNOSIS — R109 Unspecified abdominal pain: Secondary | ICD-10-CM | POA: Diagnosis not present

## 2013-12-03 DIAGNOSIS — R5383 Other fatigue: Secondary | ICD-10-CM | POA: Diagnosis not present

## 2013-12-03 DIAGNOSIS — R635 Abnormal weight gain: Secondary | ICD-10-CM | POA: Diagnosis not present

## 2013-12-03 DIAGNOSIS — E039 Hypothyroidism, unspecified: Secondary | ICD-10-CM | POA: Diagnosis not present

## 2013-12-09 DIAGNOSIS — H53459 Other localized visual field defect, unspecified eye: Secondary | ICD-10-CM | POA: Insufficient documentation

## 2013-12-10 DIAGNOSIS — H029 Unspecified disorder of eyelid: Secondary | ICD-10-CM | POA: Diagnosis not present

## 2013-12-10 DIAGNOSIS — Z79899 Other long term (current) drug therapy: Secondary | ICD-10-CM | POA: Diagnosis not present

## 2013-12-10 DIAGNOSIS — N189 Chronic kidney disease, unspecified: Secondary | ICD-10-CM | POA: Diagnosis not present

## 2013-12-10 DIAGNOSIS — Z90710 Acquired absence of both cervix and uterus: Secondary | ICD-10-CM | POA: Diagnosis not present

## 2013-12-10 DIAGNOSIS — Z85528 Personal history of other malignant neoplasm of kidney: Secondary | ICD-10-CM | POA: Diagnosis not present

## 2013-12-10 DIAGNOSIS — J309 Allergic rhinitis, unspecified: Secondary | ICD-10-CM | POA: Diagnosis not present

## 2013-12-10 DIAGNOSIS — E039 Hypothyroidism, unspecified: Secondary | ICD-10-CM | POA: Diagnosis not present

## 2013-12-10 DIAGNOSIS — L908 Other atrophic disorders of skin: Secondary | ICD-10-CM | POA: Diagnosis not present

## 2013-12-10 DIAGNOSIS — IMO0002 Reserved for concepts with insufficient information to code with codable children: Secondary | ICD-10-CM | POA: Diagnosis not present

## 2013-12-10 DIAGNOSIS — L918 Other hypertrophic disorders of the skin: Secondary | ICD-10-CM | POA: Diagnosis not present

## 2013-12-10 DIAGNOSIS — H401490 Capsular glaucoma with pseudoexfoliation of lens, unspecified eye, stage unspecified: Secondary | ICD-10-CM | POA: Diagnosis not present

## 2013-12-10 DIAGNOSIS — Z961 Presence of intraocular lens: Secondary | ICD-10-CM | POA: Diagnosis not present

## 2013-12-10 DIAGNOSIS — H539 Unspecified visual disturbance: Secondary | ICD-10-CM | POA: Diagnosis not present

## 2013-12-10 DIAGNOSIS — E669 Obesity, unspecified: Secondary | ICD-10-CM | POA: Diagnosis not present

## 2013-12-10 DIAGNOSIS — H02839 Dermatochalasis of unspecified eye, unspecified eyelid: Secondary | ICD-10-CM | POA: Diagnosis not present

## 2013-12-10 DIAGNOSIS — Z888 Allergy status to other drugs, medicaments and biological substances status: Secondary | ICD-10-CM | POA: Diagnosis not present

## 2013-12-10 DIAGNOSIS — Z905 Acquired absence of kidney: Secondary | ICD-10-CM | POA: Diagnosis not present

## 2013-12-10 DIAGNOSIS — I129 Hypertensive chronic kidney disease with stage 1 through stage 4 chronic kidney disease, or unspecified chronic kidney disease: Secondary | ICD-10-CM | POA: Diagnosis not present

## 2013-12-10 DIAGNOSIS — J329 Chronic sinusitis, unspecified: Secondary | ICD-10-CM | POA: Diagnosis not present

## 2013-12-10 DIAGNOSIS — Z7901 Long term (current) use of anticoagulants: Secondary | ICD-10-CM | POA: Diagnosis not present

## 2013-12-10 DIAGNOSIS — Z9849 Cataract extraction status, unspecified eye: Secondary | ICD-10-CM | POA: Diagnosis not present

## 2013-12-10 DIAGNOSIS — H5340 Unspecified visual field defects: Secondary | ICD-10-CM | POA: Diagnosis not present

## 2013-12-10 DIAGNOSIS — D485 Neoplasm of uncertain behavior of skin: Secondary | ICD-10-CM | POA: Diagnosis not present

## 2013-12-10 DIAGNOSIS — Z96659 Presence of unspecified artificial knee joint: Secondary | ICD-10-CM | POA: Diagnosis not present

## 2013-12-17 DIAGNOSIS — E039 Hypothyroidism, unspecified: Secondary | ICD-10-CM | POA: Diagnosis not present

## 2013-12-21 DIAGNOSIS — I10 Essential (primary) hypertension: Secondary | ICD-10-CM | POA: Diagnosis not present

## 2013-12-21 DIAGNOSIS — G4733 Obstructive sleep apnea (adult) (pediatric): Secondary | ICD-10-CM | POA: Diagnosis not present

## 2013-12-30 DIAGNOSIS — L821 Other seborrheic keratosis: Secondary | ICD-10-CM | POA: Diagnosis not present

## 2013-12-30 DIAGNOSIS — D1739 Benign lipomatous neoplasm of skin and subcutaneous tissue of other sites: Secondary | ICD-10-CM | POA: Diagnosis not present

## 2014-02-06 DIAGNOSIS — N182 Chronic kidney disease, stage 2 (mild): Secondary | ICD-10-CM | POA: Diagnosis not present

## 2014-02-06 DIAGNOSIS — C641 Malignant neoplasm of right kidney, except renal pelvis: Secondary | ICD-10-CM | POA: Insufficient documentation

## 2014-02-06 DIAGNOSIS — N200 Calculus of kidney: Secondary | ICD-10-CM | POA: Insufficient documentation

## 2014-02-06 DIAGNOSIS — I10 Essential (primary) hypertension: Secondary | ICD-10-CM | POA: Diagnosis not present

## 2014-02-06 DIAGNOSIS — K219 Gastro-esophageal reflux disease without esophagitis: Secondary | ICD-10-CM | POA: Insufficient documentation

## 2014-02-26 DIAGNOSIS — B372 Candidiasis of skin and nail: Secondary | ICD-10-CM | POA: Diagnosis not present

## 2014-02-26 DIAGNOSIS — L659 Nonscarring hair loss, unspecified: Secondary | ICD-10-CM | POA: Diagnosis not present

## 2014-03-03 DIAGNOSIS — G479 Sleep disorder, unspecified: Secondary | ICD-10-CM | POA: Diagnosis not present

## 2014-03-05 DIAGNOSIS — E039 Hypothyroidism, unspecified: Secondary | ICD-10-CM | POA: Diagnosis not present

## 2014-03-28 ENCOUNTER — Ambulatory Visit (INDEPENDENT_AMBULATORY_CARE_PROVIDER_SITE_OTHER): Payer: Medicare Other | Admitting: Pulmonary Disease

## 2014-03-28 ENCOUNTER — Encounter (INDEPENDENT_AMBULATORY_CARE_PROVIDER_SITE_OTHER): Payer: Self-pay

## 2014-03-28 ENCOUNTER — Encounter: Payer: Self-pay | Admitting: Pulmonary Disease

## 2014-03-28 VITALS — BP 118/72 | HR 75 | Temp 98.0°F | Ht 59.0 in | Wt 183.4 lb

## 2014-03-28 DIAGNOSIS — R0902 Hypoxemia: Secondary | ICD-10-CM | POA: Diagnosis not present

## 2014-03-28 DIAGNOSIS — G4734 Idiopathic sleep related nonobstructive alveolar hypoventilation: Secondary | ICD-10-CM

## 2014-03-28 NOTE — Assessment & Plan Note (Signed)
The patient has nocturnal hypoxemia that is not related to sleep disordered breathing. More than likely this is associated with hyperaeration secondary to her centripetal obesity and also her spine deformity. Her chest x-ray does not show an acute process, and we will do pulmonary function studies for completeness. If these are unremarkable, would consider doing an echocardiogram to rule out pulmonary hypertension or other dysfunction. More than likely, she is going to need nocturnal oxygen until she is able to lose considerable weight. I will be in contact with the patient and her primary physician once the pulmonary function studies are complete.

## 2014-03-28 NOTE — Patient Instructions (Signed)
Will get the results of your prior chest xray to make sure ok. Will schedule you for breathing studies, and call you with results. If we do not find a lung problem to explain your low blood oxygen at night, I suspect this is related to your weight.   Will need oxygen at night if we are unable to find a problem that is easily treated.

## 2014-03-28 NOTE — Progress Notes (Signed)
   Subjective:    Patient ID: Tammie [name], female    DOB: 07/14/1941, 73 y.o.   MRN: [id number]  HPI The patient is a 73 year old female who I've been asked to see for nocturnal hypoxemia. The patient has had multiple home sleep test which do not show significant obstructive sleep apnea, but did show significant nocturnal hypoxemia. One of her study showed 275 minutes less than or equal to 88% during the night. The patient has no history of lung disease, and denies any significant breathing issues. She has never smoked, and recent chest x-rays do not show any acute process. The patient does have centripetal obesity, and has a history of hypothyroidism. She has no significant cough or mucus production. She has never had any type of cardiac evaluation.   Review of Systems  Constitutional: Negative for fever and unexpected weight change.  HENT: Positive for congestion, ear pain and sore throat. Negative for dental problem, nosebleeds, postnasal drip, rhinorrhea, sinus pressure, sneezing and trouble swallowing.   Eyes: Negative for redness and itching.  Respiratory: Positive for cough and shortness of breath. Negative for chest tightness and wheezing.   Cardiovascular: Negative for palpitations and leg swelling.  Gastrointestinal: Negative for nausea and vomiting.  Genitourinary: Negative for dysuria.  Musculoskeletal: Negative for joint swelling.  Skin: Negative for rash.  Neurological: Negative for headaches.  Hematological: Does not bruise/bleed easily.  Psychiatric/Behavioral: Positive for dysphoric mood. The patient is nervous/anxious.        Objective:   Physical Exam Constitutional:  Obese female, no acute distress  HENT:  Nares patent without discharge  Oropharynx without exudate, palate and uvula are elongated.  Eyes:  Right pupil > left, eomi, no scleral icterus  Neck:  No JVD, no TMG  Cardiovascular:  Normal rate, regular rhythm, no rubs or gallops.  No murmurs  Intact distal pulses  Pulmonary :  Normal breath sounds, no stridor or respiratory distress   No rales, rhonchi, or wheezing  Abdominal:  Soft, nondistended, bowel sounds present.  No tenderness noted.   Musculoskeletal:  No significant lower extremity edema noted.  Lymph Nodes:  No cervical lymphadenopathy noted  Skin:  No cyanosis noted  Neurologic:  Alert, appropriate, moves all 4 extremities without obvious deficit.         Assessment & Plan:

## 2014-04-02 ENCOUNTER — Inpatient Hospital Stay: Admission: RE | Admit: 2014-04-02 | Payer: Medicare Other | Source: Ambulatory Visit

## 2014-04-22 DIAGNOSIS — M25569 Pain in unspecified knee: Secondary | ICD-10-CM | POA: Diagnosis not present

## 2014-04-30 ENCOUNTER — Ambulatory Visit (INDEPENDENT_AMBULATORY_CARE_PROVIDER_SITE_OTHER): Payer: Medicare Other | Admitting: Pulmonary Disease

## 2014-04-30 DIAGNOSIS — R0902 Hypoxemia: Secondary | ICD-10-CM | POA: Diagnosis not present

## 2014-04-30 DIAGNOSIS — G4734 Idiopathic sleep related nonobstructive alveolar hypoventilation: Secondary | ICD-10-CM

## 2014-04-30 NOTE — Progress Notes (Signed)
PFT done today. 

## 2014-05-07 LAB — PULMONARY FUNCTION TEST
DL/VA % PRED: 150 %
DL/VA: 5.56 ml/min/mmHg/L
DLCO UNC % PRED: 108 %
DLCO UNC: 16.05 ml/min/mmHg
FEF 25-75 POST: 2.5 L/s
FEF 25-75 Pre: 2.16 L/sec
FEF2575-%Change-Post: 15 %
FEF2575-%PRED-PRE: 168 %
FEF2575-%Pred-Post: 195 %
FEV1-%Change-Post: 4 %
FEV1-%Pred-Post: 98 %
FEV1-%Pred-Pre: 94 %
FEV1-Post: 1.45 L
FEV1-Pre: 1.4 L
FEV1FVC-%CHANGE-POST: 2 %
FEV1FVC-%Pred-Pre: 116 %
FEV6-%CHANGE-POST: 1 %
FEV6-%PRED-POST: 86 %
FEV6-%Pred-Pre: 84 %
FEV6-POST: 1.63 L
FEV6-Pre: 1.6 L
FEV6FVC-%CHANGE-POST: 0 %
FEV6FVC-%PRED-POST: 105 %
FEV6FVC-%Pred-Pre: 104 %
FVC-%CHANGE-POST: 1 %
FVC-%PRED-PRE: 80 %
FVC-%Pred-Post: 81 %
FVC-POST: 1.63 L
FVC-Pre: 1.61 L
PRE FEV1/FVC RATIO: 87 %
Post FEV1/FVC ratio: 89 %
Post FEV6/FVC ratio: 100 %
Pre FEV6/FVC Ratio: 100 %
RV % pred: 58 %
RV: 1.14 L
TLC % pred: 74 %
TLC: 2.98 L

## 2014-05-13 ENCOUNTER — Telehealth: Payer: Self-pay | Admitting: *Deleted

## 2014-05-13 DIAGNOSIS — G4734 Idiopathic sleep related nonobstructive alveolar hypoventilation: Secondary | ICD-10-CM

## 2014-05-13 NOTE — Telephone Encounter (Signed)
Message copied by Lilli Few on Tue May 13, 2014  9:41 AM ------      Message from: [place]      Created: Thu May 08, 2014  9:07 AM       Please let pt know that her breathing studies are normal except for her lung capacity being mildly reduced related to her obesity as we discussed.  Should work on weight loss, and maintain oxygen at night until this is achieved.      Will send a note to her primary md. ------

## 2014-05-13 NOTE — Telephone Encounter (Signed)
Pt states that she is NOT wearing O2 at night.  This was never ordered and her PCP has never followed her oxygen  Please advise Dr Gwenette Greet, thanks.

## 2014-05-13 NOTE — Telephone Encounter (Signed)
I spoke with the pt and advised of recs. She states she has never received any oxygen to use at night. Were we supposed to order this or should this come through the doctor that ordered the test? [place], [place]

## 2014-05-13 NOTE — Telephone Encounter (Signed)
I was under the assumption that she was wearing oxygen at night as ordered by her primary.  Is this not true?

## 2014-05-14 NOTE — Telephone Encounter (Signed)
Spoke with pt and advised of Dr Gwenette Greet recommendations.  Oxygen order placed.  Pt asked if we could mail her a copy of a diet to follow for weight reduction.  Dr Drue Second , do you recommend a specific diet?

## 2014-05-14 NOTE — Telephone Encounter (Signed)
Ok to order oxygen at hs at 2 lpm. Needs to work on weight reduction followup with me in one year.

## 2014-05-14 NOTE — Telephone Encounter (Signed)
Copy of limited calorie diet mailed to pt.

## 2014-05-14 NOTE — Telephone Encounter (Signed)
If we have any weight loss diets here in a handout, ok to send to her.  Otherwise, would speak with her primary md or have them refer to nutrition at the hospital for a consultation.

## 2014-05-19 ENCOUNTER — Telehealth: Payer: Self-pay | Admitting: *Deleted

## 2014-05-19 DIAGNOSIS — G4734 Idiopathic sleep related nonobstructive alveolar hypoventilation: Secondary | ICD-10-CM

## 2014-05-19 NOTE — Telephone Encounter (Signed)
Message copied by Lilli Few on Mon May 19, 2014  2:11 PM ------      Message from: Phillips Grout J      Created: Wed May 14, 2014 11:50 AM      Regarding: referral placed for 02 at 2 lpm QHS       See phone note.      We didn't order the ONO and do not know who or when the ONO was preformed. According to phone message, it was ordered by primary doctor and order never was placed for o2.      If ONO was done more than 30 days ago, we will need to arrange a new ono.       I didn't see a report scanned into epic at all, however, maybe I just don't see.            Referral was placed for [place] to order o2 at 2 lpm QHS by Dr. Gwenette Greet.      Please advise.      Thanks,      Suanne Marker ------

## 2014-05-23 ENCOUNTER — Telehealth: Payer: Self-pay | Admitting: Pulmonary Disease

## 2014-05-23 NOTE — Telephone Encounter (Signed)
Per order General 05/20/2014 9:12 AM [name], Tammie [name]        Note    Called and spoke with patient and she does not have a DME company. Advised patient that since sleep study was > 30 days we have to arrange for an ONO on RA to re-evaluate her o2 needs. Pt is aware that order has been sent to [facility] to arrange. Once results are received, and if pt qualifies order for o2 will be sent to [facility]. Will defer order until end of this week to allow for ono results. Tammie [name]  --  Called [facility] and spoke with kim. They are coming out today to deliver ONO to her. i called pt and made her aware. She is going to call [facility] bc she needs to know a specific time they are coming out. Nothing further needed

## 2014-05-25 DIAGNOSIS — R0902 Hypoxemia: Secondary | ICD-10-CM | POA: Diagnosis not present

## 2014-05-26 ENCOUNTER — Other Ambulatory Visit: Payer: Self-pay | Admitting: Endocrinology

## 2014-05-26 ENCOUNTER — Other Ambulatory Visit (HOSPITAL_COMMUNITY): Payer: Self-pay | Admitting: Endocrinology

## 2014-05-26 ENCOUNTER — Ambulatory Visit (HOSPITAL_COMMUNITY): Payer: Medicare Other

## 2014-05-26 DIAGNOSIS — E049 Nontoxic goiter, unspecified: Secondary | ICD-10-CM | POA: Diagnosis not present

## 2014-05-26 DIAGNOSIS — E039 Hypothyroidism, unspecified: Secondary | ICD-10-CM | POA: Diagnosis not present

## 2014-05-27 ENCOUNTER — Ambulatory Visit
Admission: RE | Admit: 2014-05-27 | Discharge: 2014-05-27 | Disposition: A | Discharge status: Home / Self Care | Payer: Medicare Other | Source: Ambulatory Visit | Attending: Endocrinology | Admitting: Endocrinology

## 2014-05-27 ENCOUNTER — Telehealth: Payer: Self-pay | Admitting: Pulmonary Disease

## 2014-05-27 DIAGNOSIS — E049 Nontoxic goiter, unspecified: Secondary | ICD-10-CM

## 2014-05-27 NOTE — Telephone Encounter (Signed)
Order faxed to [facility] to start o2 at 2 lpm QHS. Rhonda J Cobb

## 2014-05-27 NOTE — Telephone Encounter (Signed)
Pt had ONO 05/25/2014 that redocuments noctural hypoxemia.  Please let dme know to start oxygen as ordered.

## 2014-06-04 ENCOUNTER — Telehealth: Payer: Self-pay | Admitting: Pulmonary Disease

## 2014-06-04 NOTE — Telephone Encounter (Signed)
Called spoke with patient and advised of PW's recommendations to hold the O2 and reevaluate with [facility] when he returns to the office.  First available with [facility] is 8.19.15 > appt scheduled for 1015 on this date.  Pt is okay with this date and is aware to call the office again should she need anything further in the meantime.    Will sign and forward to [facility] for when he returns to office - unsure if he would like pt seen sooner.

## 2014-06-04 NOTE — Telephone Encounter (Signed)
Hold oxygen and get Rov with Dr Gwenette Greet to [facility]

## 2014-06-04 NOTE — Telephone Encounter (Signed)
Pt seen for consult by [facility] on 5.15.15 for nocturnal hypoxemia.  Had sleep study done which was negative but she did qualify for O2.  Called spoke with patient who reported that she has been wearing her O2 about 1 week (was ordered on 7.14 for 2lpm at bedtime) and has not been sleeping well since.  She goes to bed at approx 1-2am and wakes up at 6am.  Pt stated it is not the cannula or tubing that is keeping her from sleeping (she does not get tangled) and is unsure if it is the the noise of the concentrator or the O2 itself.  She does not want to wear the O2 any longer.  Mason is not in the office this week, will forward to doc of the day for recommendations.  Dr Joya Gaskins please advise, thank you.

## 2014-06-19 ENCOUNTER — Encounter: Payer: Self-pay | Admitting: Pulmonary Disease

## 2014-06-27 ENCOUNTER — Encounter: Payer: Self-pay | Admitting: Cardiology

## 2014-06-27 ENCOUNTER — Ambulatory Visit (INDEPENDENT_AMBULATORY_CARE_PROVIDER_SITE_OTHER): Payer: Medicare Other | Admitting: Cardiology

## 2014-06-27 ENCOUNTER — Other Ambulatory Visit: Payer: Self-pay | Admitting: *Deleted

## 2014-06-27 VITALS — BP 90/70 | HR 66 | Ht <= 58 in | Wt 180.0 lb

## 2014-06-27 DIAGNOSIS — R0609 Other forms of dyspnea: Secondary | ICD-10-CM

## 2014-06-27 DIAGNOSIS — R0989 Other specified symptoms and signs involving the circulatory and respiratory systems: Principal | ICD-10-CM

## 2014-06-27 DIAGNOSIS — G4734 Idiopathic sleep related nonobstructive alveolar hypoventilation: Secondary | ICD-10-CM | POA: Diagnosis not present

## 2014-06-27 DIAGNOSIS — E65 Localized adiposity: Secondary | ICD-10-CM

## 2014-06-27 DIAGNOSIS — R0789 Other chest pain: Secondary | ICD-10-CM | POA: Diagnosis not present

## 2014-06-27 NOTE — Progress Notes (Signed)
[facility]. [address], [address], [place]  [address] Phone: [phone] Fax:  [phone]  Date:  06/27/2014   ID:  DARIN REDMANN, DOB [birth year], MRN [id number]  PCP:  [name], MD  CARDIOLOGY: Dr. Radford Pax.   History of Present Illness: Tammie [name] is a 73 y.o. female here for evaluation of shortness of breath. She has had a pulmonary evaluation with pulmonary function studies that were unremarkable. She saw Dr. Gwenette Greet. Chest x-ray was normal. Never smoked. No cardiac workup previously. On sleep test showed 275 minutes with less than or equal to 88% oxygen saturation at night. She gets dyspnea with exertion. Mild to moderate. Previously had a workup with Dr. Radford Pax with stress test, echocardiogram. Reassuring. This was years ago.  She takes care of her sick husband. Challenging for her to leave the house.  Fleeting left upper chest wall pain, every 4 months.    Wt Readings from Last 3 Encounters:  06/27/14 180 lb (81.647 kg)  03/28/14 183 lb 6.4 oz (83.19 kg)     Past Medical History  Diagnosis Date  . HTN (hypertension)   . Renal cell cancer   . Glaucoma   . Hypothyroidism   . Colon adenoma   . Chronic kidney disease   . Depression     Past Surgical History  Procedure Laterality Date  . Total abdominal hysterectomy    . Kidney surgery    . Replacement total knee      Current Outpatient Prescriptions  Medication Sig Dispense Refill  . alprazolam (XANAX) 2 MG tablet Take 2 mg by mouth at bedtime as needed for sleep.      . Ascorbic Acid (VITAMIN C) 500 MG CAPS Take 1 tablet by mouth.      . Calcium Carb-Cholecalciferol (CALCIUM + D3) 600-200 MG-UNIT TABS Take 2 tablets by mouth.       Marland Kitchen FLUoxetine (PROZAC) 20 MG capsule Take 1 capsule by mouth daily.      Marland Kitchen GLUCOSAMINE SULFATE PO Take 1 tablet by mouth 2 (two) times daily.       . Glucosamine-Chondroitin (GLUCOSAMINE CHONDR COMPLEX PO) Take 1 tablet by mouth.      Marland Kitchen  glucosamine-chondroitin 500-400 MG tablet Take 1 tablet by mouth 3 (three) times daily.      Marland Kitchen ketoconazole (NIZORAL) 2 % shampoo       . levothyroxine (SYNTHROID, LEVOTHROID) 50 MCG tablet Take 75 mcg by mouth.       . losartan (COZAAR) 100 MG tablet Take 1 tablet by mouth daily.      . Multiple Vitamin (MULTI-VITAMINS) TABS Take by mouth.      Marland Kitchen omeprazole (PRILOSEC) 20 MG capsule Take by mouth.      . traZODone (DESYREL) 50 MG tablet Take 1 tablet by mouth daily.       No current facility-administered medications for this visit.    Allergies:    Allergies  Allergen Reactions  . Levaquin [Levofloxacin] Hives  . Hydrocodone-Acetaminophen Other (See Comments)    DRY MUCOUS MENBRANES  . Oxycodone Other (See Comments)    DRY MUCOUS MEMBRANES  . Other Rash  . Quinolones Rash    Social History:  The patient  reports that she has never smoked. She does not have any smokeless tobacco history on file. She reports that she does not drink alcohol or use illicit drugs.   Family History  Problem Relation Age of Onset  . Heart disease Father  due to small pox as a [place]  . Lung cancer Paternal Grandfather     lived to age 40  No early CAD.   ROS:  Please see the history of present illness.   Denies any fevers, chills, orthopnea, PND, strokelike symptoms, bleeding. Positive for shortness of breath.   All other systems reviewed and negative.   PHYSICAL EXAM: VS:  BP 90/70  Pulse 66  Ht 4\' 9"  (1.448 m)  Wt 180 lb (81.647 kg)  BMI 38.94 kg/m2 Well nourished, well developed, in no acute distress HEENT: normal, [place]/AT, EOMI Neck: no JVD, normal carotid upstroke, no bruit Cardiac:  normal S1, S2; RRR; no murmur Lungs:  clear to auscultation bilaterally, no wheezing, rhonchi or rales Abd: soft, nontender, no hepatomegaly, no bruits Ext: no edema, 2+ distal pulses Skin: warm and dry GU: deferred Neuro: no focal abnormalities noted, AAO x 3  EKG:  06/27/14-sinus rhythm, 66, no other  abnormalities  Labs: 12/03/13-hemoglobin 14.5, TSH normal, creatinine 0.89 PFTs-mild restriction, DLCO normal. No obstruction. Reassuring. Prior office notes reviewed, lab work, pulmonary function studies.  ASSESSMENT AND PLAN:  1. Dyspnea/nocturnal hypoxia-pulmonary evaluation unremarkable. Reassuring. We will go ahead and proceed with echocardiogram to evaluate both left and right heart structures. The patient careful attention to the possibility of pulmonary hypertension. Will check an exercise treadmill test. She insisted she would be able to walk. She has a left knee replacement. We will try. If needed, pharmacologic stress test. If cardiac workup is reassuring, it is likely that her nocturnal hypoxia is secondary to her central obesity. Obesity hypoventilation syndrome. 2. Obesity-encourage weight loss. 3. Nocturnal hypoxia-as described by Dr. Maxwell Caul on home sleep study. Cardiac workup as above. Pulmonary workup reassuring. 4. Hypertension-excellent control. She is not dizzy with her current blood pressure. Continue to monitor. 5. We will followup with testing.  Signed, Candee Furbish, MD [facility]  06/27/2014 12:40 PM

## 2014-06-27 NOTE — Patient Instructions (Signed)
The current medical regimen is effective;  continue present plan and medications.  Your physician has requested that you have an echocardiogram. Echocardiography is a painless test that uses sound waves to create images of your heart. It provides your doctor with information about the size and shape of your heart and how well your heart's chambers and valves are working. This procedure takes approximately one hour. There are no restrictions for this procedure.  Your physician has requested that you have an exercise tolerance test. For further information please visit [email or web address]. Please also follow instruction sheet, as given.  Follow up as needed.

## 2014-07-02 ENCOUNTER — Ambulatory Visit (INDEPENDENT_AMBULATORY_CARE_PROVIDER_SITE_OTHER): Payer: Medicare Other | Admitting: Pulmonary Disease

## 2014-07-02 ENCOUNTER — Encounter: Payer: Self-pay | Admitting: Pulmonary Disease

## 2014-07-02 VITALS — BP 120/72 | HR 63 | Temp 97.9°F | Ht <= 58 in | Wt 182.2 lb

## 2014-07-02 DIAGNOSIS — G4734 Idiopathic sleep related nonobstructive alveolar hypoventilation: Secondary | ICD-10-CM | POA: Diagnosis not present

## 2014-07-02 NOTE — Progress Notes (Signed)
   Subjective:    Patient ID: Tammie [name], female    DOB: Jun 13, 1941, 73 y.o.   MRN: [id number]  HPI The patient comes in today for followup of her known nocturnal hypoxemia secondary to centripetal obesity. She has had a pulmonary workup that was unremarkable, and no evidence for sleep apnea on 2 different sleep studies. She has maintained on her oxygen at night, and has become accustomed to wearing it. Unfortunately, her weight has not significantly changed since the last visit.   Review of Systems  Constitutional: Negative for fever and unexpected weight change.  HENT: Negative for congestion, dental problem, ear pain, nosebleeds, postnasal drip, rhinorrhea, sinus pressure, sneezing, sore throat and trouble swallowing.   Eyes: Negative for redness and itching.  Respiratory: Negative for cough, chest tightness, shortness of breath and wheezing.   Cardiovascular: Negative for palpitations and leg swelling.  Gastrointestinal: Negative for nausea and vomiting.  Genitourinary: Negative for dysuria.  Musculoskeletal: Negative for joint swelling.  Skin: Negative for rash.  Neurological: Negative for headaches.  Hematological: Does not bruise/bleed easily.  Psychiatric/Behavioral: Negative for dysphoric mood. The patient is not nervous/anxious.        Objective:   Physical Exam Obese female in no acute distress Nose without purulence or discharge noted Neck without lymphadenopathy or thyromegaly Lower extremities with minimal edema, no cyanosis Alert and oriented, moves all 4 extremities.       Assessment & Plan:

## 2014-07-02 NOTE — Patient Instructions (Signed)
Work on [employer] loss Continue wearing your oxygen at night while sleeping until you are able to lose significant weight. followup with me as needed.

## 2014-07-02 NOTE — Assessment & Plan Note (Signed)
The patient has nocturnal hypoxemia secondary to centripetal obesity with restrictive physiology during sleep. I have asked her to continue on nocturnal oxygen in order to prevent the complication of pulmonary hypertension, and to work aggressively on weight loss.

## 2014-07-08 ENCOUNTER — Encounter: Payer: Self-pay | Admitting: Pulmonary Disease

## 2014-07-28 ENCOUNTER — Ambulatory Visit (INDEPENDENT_AMBULATORY_CARE_PROVIDER_SITE_OTHER): Payer: Medicare Other | Admitting: Physician Assistant

## 2014-07-28 ENCOUNTER — Ambulatory Visit (HOSPITAL_COMMUNITY): Payer: Medicare Other | Attending: Internal Medicine | Admitting: Cardiology

## 2014-07-28 DIAGNOSIS — R0989 Other specified symptoms and signs involving the circulatory and respiratory systems: Secondary | ICD-10-CM | POA: Diagnosis not present

## 2014-07-28 DIAGNOSIS — I10 Essential (primary) hypertension: Secondary | ICD-10-CM | POA: Insufficient documentation

## 2014-07-28 DIAGNOSIS — E669 Obesity, unspecified: Secondary | ICD-10-CM | POA: Insufficient documentation

## 2014-07-28 DIAGNOSIS — R0602 Shortness of breath: Secondary | ICD-10-CM | POA: Diagnosis not present

## 2014-07-28 DIAGNOSIS — R0609 Other forms of dyspnea: Secondary | ICD-10-CM

## 2014-07-28 DIAGNOSIS — I059 Rheumatic mitral valve disease, unspecified: Secondary | ICD-10-CM | POA: Diagnosis not present

## 2014-07-28 NOTE — Progress Notes (Signed)
Reassuring exercise treadmill test.

## 2014-07-28 NOTE — Progress Notes (Signed)
Echo performed. 

## 2014-07-28 NOTE — Progress Notes (Signed)
Exercise Treadmill Test  Pre-Exercise Testing Evaluation Rhythm: sinus bradycardia  Rate: 51     Test  Exercise Tolerance Test Ordering MD: Candee Furbish, MD  Interpreting MD: Richardson Dopp, PA-C  Unique Test No: 1  Treadmill:  1  Indication for ETT: Dyspnea  Contraindication to ETT: No   Stress Modality: exercise - treadmill  Cardiac Imaging Performed: non   Protocol: standard Bruce - maximal  Max BP:  182/83  Max MPHR (bpm):  145 85% MPR (bpm):  123  MPHR obtained (bpm):  127 % MPHR obtained:  88  Reached 85% MPHR (min:sec):  4:30 Total Exercise Time (min-sec):  5:00  Workload in METS:  7.0 Borg Scale: 13  Reason ETT Terminated:  patient's desire to stop    ST Segment Analysis At Rest: normal ST segments - no evidence of significant ST depression With Exercise: non-specific ST changes  Other Information Arrhythmia:  No Angina during ETT:  absent (0) Quality of ETT:  diagnostic  ETT Interpretation:  normal - no evidence of ischemia by ST analysis  Comments: Fair exercise capacity. No chest pain.  Patient did complain of dizziness at peak exercise. Normal BP response to exercise. Non-specific ST changes and increased artifact.  No significant ST depression to suggest ischemia.   Recommendations: FU with Dr. Candee Furbish as directed. Signed,  Richardson Dopp, PA-C   07/28/2014 11:34 AM

## 2014-08-08 DIAGNOSIS — Z23 Encounter for immunization: Secondary | ICD-10-CM | POA: Diagnosis not present

## 2014-08-08 DIAGNOSIS — J358 Other chronic diseases of tonsils and adenoids: Secondary | ICD-10-CM | POA: Diagnosis not present

## 2014-08-08 DIAGNOSIS — J329 Chronic sinusitis, unspecified: Secondary | ICD-10-CM | POA: Diagnosis not present

## 2014-08-31 ENCOUNTER — Inpatient Hospital Stay (HOSPITAL_COMMUNITY)
Admission: EM | Admit: 2014-08-31 | Discharge: 2014-09-04 | DRG: 493 | Disposition: A | Discharge status: Home Health | Payer: Medicare Other | Attending: Internal Medicine | Admitting: Internal Medicine

## 2014-08-31 ENCOUNTER — Encounter (HOSPITAL_COMMUNITY): Payer: Self-pay | Admitting: Emergency Medicine

## 2014-08-31 ENCOUNTER — Emergency Department (HOSPITAL_COMMUNITY): Payer: Medicare Other

## 2014-08-31 DIAGNOSIS — Z85528 Personal history of other malignant neoplasm of kidney: Secondary | ICD-10-CM | POA: Diagnosis not present

## 2014-08-31 DIAGNOSIS — W010XXA Fall on same level from slipping, tripping and stumbling without subsequent striking against object, initial encounter: Secondary | ICD-10-CM | POA: Diagnosis present

## 2014-08-31 DIAGNOSIS — N182 Chronic kidney disease, stage 2 (mild): Secondary | ICD-10-CM | POA: Diagnosis not present

## 2014-08-31 DIAGNOSIS — M25571 Pain in right ankle and joints of right foot: Secondary | ICD-10-CM | POA: Diagnosis not present

## 2014-08-31 DIAGNOSIS — E039 Hypothyroidism, unspecified: Secondary | ICD-10-CM | POA: Diagnosis present

## 2014-08-31 DIAGNOSIS — G4734 Idiopathic sleep related nonobstructive alveolar hypoventilation: Secondary | ICD-10-CM | POA: Diagnosis not present

## 2014-08-31 DIAGNOSIS — I129 Hypertensive chronic kidney disease with stage 1 through stage 4 chronic kidney disease, or unspecified chronic kidney disease: Secondary | ICD-10-CM | POA: Diagnosis present

## 2014-08-31 DIAGNOSIS — D62 Acute posthemorrhagic anemia: Secondary | ICD-10-CM | POA: Diagnosis not present

## 2014-08-31 DIAGNOSIS — S82841A Displaced bimalleolar fracture of right lower leg, initial encounter for closed fracture: Secondary | ICD-10-CM | POA: Diagnosis not present

## 2014-08-31 DIAGNOSIS — J9610 Chronic respiratory failure, unspecified whether with hypoxia or hypercapnia: Secondary | ICD-10-CM | POA: Diagnosis not present

## 2014-08-31 DIAGNOSIS — S82891A Other fracture of right lower leg, initial encounter for closed fracture: Secondary | ICD-10-CM

## 2014-08-31 DIAGNOSIS — T148 Other injury of unspecified body region: Secondary | ICD-10-CM | POA: Diagnosis not present

## 2014-08-31 DIAGNOSIS — S8251XA Displaced fracture of medial malleolus of right tibia, initial encounter for closed fracture: Secondary | ICD-10-CM | POA: Diagnosis not present

## 2014-08-31 DIAGNOSIS — I10 Essential (primary) hypertension: Secondary | ICD-10-CM

## 2014-08-31 DIAGNOSIS — Z905 Acquired absence of kidney: Secondary | ICD-10-CM | POA: Diagnosis present

## 2014-08-31 DIAGNOSIS — Z96659 Presence of unspecified artificial knee joint: Secondary | ICD-10-CM | POA: Diagnosis present

## 2014-08-31 DIAGNOSIS — R52 Pain, unspecified: Secondary | ICD-10-CM | POA: Diagnosis present

## 2014-08-31 DIAGNOSIS — F418 Other specified anxiety disorders: Secondary | ICD-10-CM | POA: Diagnosis present

## 2014-08-31 DIAGNOSIS — S82843A Displaced bimalleolar fracture of unspecified lower leg, initial encounter for closed fracture: Secondary | ICD-10-CM | POA: Diagnosis present

## 2014-08-31 DIAGNOSIS — E038 Other specified hypothyroidism: Secondary | ICD-10-CM

## 2014-08-31 DIAGNOSIS — F329 Major depressive disorder, single episode, unspecified: Secondary | ICD-10-CM | POA: Diagnosis present

## 2014-08-31 DIAGNOSIS — Z9981 Dependence on supplemental oxygen: Secondary | ICD-10-CM

## 2014-08-31 DIAGNOSIS — S82841B Displaced bimalleolar fracture of right lower leg, initial encounter for open fracture type I or II: Secondary | ICD-10-CM | POA: Diagnosis not present

## 2014-08-31 DIAGNOSIS — S82841D Displaced bimalleolar fracture of right lower leg, subsequent encounter for closed fracture with routine healing: Secondary | ICD-10-CM | POA: Diagnosis not present

## 2014-08-31 DIAGNOSIS — R0902 Hypoxemia: Secondary | ICD-10-CM | POA: Diagnosis not present

## 2014-08-31 LAB — CBC WITH DIFFERENTIAL/PLATELET
Basophils Absolute: 0 10*3/uL (ref 0.0–0.1)
Basophils Relative: 0 % (ref 0–1)
EOS PCT: 1 % (ref 0–5)
Eosinophils Absolute: 0.1 10*3/uL (ref 0.0–0.7)
HCT: 44.9 % (ref 36.0–46.0)
Hemoglobin: 14.4 g/dL (ref 12.0–15.0)
LYMPHS ABS: 2.2 10*3/uL (ref 0.7–4.0)
Lymphocytes Relative: 26 % (ref 12–46)
MCH: 30.5 pg (ref 26.0–34.0)
MCHC: 32.1 g/dL (ref 30.0–36.0)
MCV: 95.1 fL (ref 78.0–100.0)
MONO ABS: 0.6 10*3/uL (ref 0.1–1.0)
Monocytes Relative: 7 % (ref 3–12)
Neutro Abs: 5.7 10*3/uL (ref 1.7–7.7)
Neutrophils Relative %: 66 % (ref 43–77)
Platelets: 266 10*3/uL (ref 150–400)
RBC: 4.72 MIL/uL (ref 3.87–5.11)
RDW: 14.3 % (ref 11.5–15.5)
WBC: 8.6 10*3/uL (ref 4.0–10.5)

## 2014-08-31 LAB — COMPREHENSIVE METABOLIC PANEL
ALK PHOS: 92 U/L (ref 39–117)
ALT: 38 U/L — ABNORMAL HIGH (ref 0–35)
ANION GAP: 12 (ref 5–15)
AST: 34 U/L (ref 0–37)
Albumin: 3.7 g/dL (ref 3.5–5.2)
BUN: 18 mg/dL (ref 6–23)
CALCIUM: 9.8 mg/dL (ref 8.4–10.5)
CO2: 27 mEq/L (ref 19–32)
Chloride: 100 mEq/L (ref 96–112)
Creatinine, Ser: 0.99 mg/dL (ref 0.50–1.10)
GFR calc non Af Amer: 54 mL/min — ABNORMAL LOW (ref >90)
GFR, EST AFRICAN AMERICAN: 63 mL/min — AB (ref >90)
GLUCOSE: 107 mg/dL — AB (ref 70–99)
Potassium: 4.4 mEq/L (ref 3.7–5.3)
Sodium: 139 mEq/L (ref 137–147)
Total Bilirubin: 0.3 mg/dL (ref 0.3–1.2)
Total Protein: 7.7 g/dL (ref 6.0–8.3)

## 2014-08-31 MED ORDER — LATANOPROST 0.005 % OP SOLN
1.0000 [drp] | Freq: Every day | OPHTHALMIC | Status: DC
  Administered 2014-08-31 – 2014-09-03 (×4): 1 [drp] via OPHTHALMIC
  Filled 2014-08-31 (×2): qty 2.5

## 2014-08-31 MED ORDER — FENTANYL CITRATE 0.05 MG/ML IJ SOLN
25.0000 ug | Freq: Once | INTRAMUSCULAR | Status: AC
Start: 2014-08-31 — End: 2014-08-31
  Administered 2014-08-31: 25 ug via INTRAVENOUS

## 2014-08-31 MED ORDER — ACETAMINOPHEN 325 MG PO TABS
650.0000 mg | ORAL_TABLET | Freq: Four times a day (QID) | ORAL | Status: DC | PRN
  Administered 2014-08-31 – 2014-09-03 (×3): 650 mg via ORAL
  Filled 2014-08-31 (×3): qty 2

## 2014-08-31 MED ORDER — DOCUSATE SODIUM 100 MG PO CAPS
100.0000 mg | ORAL_CAPSULE | Freq: Two times a day (BID) | ORAL | Status: DC
  Administered 2014-08-31 – 2014-09-04 (×6): 100 mg via ORAL
  Filled 2014-08-31 (×8): qty 1

## 2014-08-31 MED ORDER — HEPARIN SODIUM (PORCINE) 5000 UNIT/ML IJ SOLN
5000.0000 [IU] | Freq: Once | INTRAMUSCULAR | Status: DC
  Filled 2014-08-31: qty 1

## 2014-08-31 MED ORDER — HYDROMORPHONE HCL 1 MG/ML IJ SOLN
0.5000 mg | INTRAMUSCULAR | Status: DC | PRN
  Administered 2014-08-31 – 2014-09-03 (×7): 0.5 mg via INTRAVENOUS
  Filled 2014-08-31 (×7): qty 1

## 2014-08-31 MED ORDER — ACETAMINOPHEN 650 MG RE SUPP: 650.0000 mg | Freq: Four times a day (QID) | RECTAL | Status: DC | PRN

## 2014-08-31 MED ORDER — LOSARTAN POTASSIUM 50 MG PO TABS
100.0000 mg | ORAL_TABLET | Freq: Every day | ORAL | Status: DC
  Filled 2014-08-31: qty 2

## 2014-08-31 MED ORDER — VITAMIN C 500 MG PO TABS
500.0000 mg | ORAL_TABLET | Freq: Every day | ORAL | Status: DC
  Administered 2014-09-02 – 2014-09-04 (×3): 500 mg via ORAL
  Filled 2014-08-31 (×4): qty 1

## 2014-08-31 MED ORDER — CALCIUM CARBONATE-VITAMIN D 500-200 MG-UNIT PO TABS
1.0000 | ORAL_TABLET | Freq: Two times a day (BID) | ORAL | Status: DC
  Administered 2014-08-31 – 2014-09-04 (×7): 1 via ORAL
  Filled 2014-08-31 (×9): qty 1

## 2014-08-31 MED ORDER — HYDROMORPHONE HCL 1 MG/ML IJ SOLN
1.0000 mg | Freq: Once | INTRAMUSCULAR | Status: AC
Start: 2014-08-31 — End: 2014-08-31
  Administered 2014-08-31: 1 mg via INTRAVENOUS
  Filled 2014-08-31: qty 1

## 2014-08-31 MED ORDER — ALPRAZOLAM 0.5 MG PO TABS
2.0000 mg | ORAL_TABLET | Freq: Every day | ORAL | Status: DC
  Administered 2014-08-31 – 2014-09-03 (×4): 2 mg via ORAL
  Filled 2014-08-31: qty 4
  Filled 2014-08-31: qty 2
  Filled 2014-08-31 (×2): qty 4

## 2014-08-31 MED ORDER — ADULT MULTIVITAMIN W/MINERALS CH
1.0000 | ORAL_TABLET | Freq: Every day | ORAL | Status: DC
  Administered 2014-09-02 – 2014-09-04 (×3): 1 via ORAL
  Filled 2014-08-31 (×4): qty 1

## 2014-08-31 MED ORDER — ONDANSETRON HCL 4 MG PO TABS
4.0000 mg | ORAL_TABLET | Freq: Four times a day (QID) | ORAL | Status: DC | PRN
Start: 2014-08-31 — End: 2014-09-01
  Filled 2014-08-31: qty 1

## 2014-08-31 MED ORDER — TRAZODONE HCL 50 MG PO TABS
50.0000 mg | ORAL_TABLET | Freq: Every day | ORAL | Status: DC
  Administered 2014-08-31 – 2014-09-03 (×4): 50 mg via ORAL
  Filled 2014-08-31 (×5): qty 1

## 2014-08-31 MED ORDER — PANTOPRAZOLE SODIUM 40 MG PO TBEC
40.0000 mg | DELAYED_RELEASE_TABLET | Freq: Every day | ORAL | Status: DC
  Filled 2014-08-31 (×2): qty 1

## 2014-08-31 MED ORDER — FENTANYL CITRATE 0.05 MG/ML IJ SOLN
25.0000 ug | Freq: Once | INTRAMUSCULAR | Status: DC
  Filled 2014-08-31: qty 2

## 2014-08-31 MED ORDER — FLUOXETINE HCL 20 MG PO CAPS
20.0000 mg | ORAL_CAPSULE | Freq: Every day | ORAL | Status: DC
  Administered 2014-09-01 – 2014-09-04 (×4): 20 mg via ORAL
  Filled 2014-08-31 (×4): qty 1

## 2014-08-31 MED ORDER — ONDANSETRON HCL 4 MG/2ML IJ SOLN
4.0000 mg | Freq: Four times a day (QID) | INTRAMUSCULAR | Status: DC | PRN
  Filled 2014-08-31: qty 2

## 2014-08-31 MED ORDER — ENOXAPARIN SODIUM 40 MG/0.4ML ~~LOC~~ SOLN
40.0000 mg | SUBCUTANEOUS | Status: DC
  Filled 2014-08-31: qty 0.4

## 2014-08-31 MED ORDER — LEVOTHYROXINE SODIUM 75 MCG PO TABS
75.0000 ug | ORAL_TABLET | Freq: Every day | ORAL | Status: DC
  Administered 2014-09-01 – 2014-09-04 (×4): 75 ug via ORAL
  Filled 2014-08-31 (×5): qty 1

## 2014-08-31 MED ORDER — CALCIUM + D3 600-200 MG-UNIT PO TABS: 1.0000 | ORAL_TABLET | Freq: Two times a day (BID) | ORAL | Status: DC

## 2014-08-31 NOTE — H&P (Signed)
History and Physical  Tammie [name] TIW:[id number] DOB: 05/19/1941 DOA: 08/31/2014   PCP: [name], MD   Chief Complaint: Mechanical fall with ankle fracture  HPI:  73 year old female with a history of hypertension, renal cell carcinoma, hypothyroidism, hypertension, and chronic respiratory failure (2L at night), presents after a mechanical fall at her home. The patient tripped over her own feet when her clogs were malpositioned.  She had immediate right ankle pain and have difficulty getting up. EMS was activated. The patient was brought to the emergency department where x-ray of the right ankle revealed a displaced right distal fibular fracture and displaced fracture of the medial malleolus with subtle medial subluxation of the distal tibia. The patient still has significant pain and is unable to walk. As result admission was requested. CBC, hepatic enzymes, and BMP were unremarkable. The patient was hemodynamically stable and afebrile. Assessment/Plan: Uncontrolled pain -Secondary to right ankle fracture -Continue hydromorphone 0.5 mg q 3 hrs prn pain Right ankle fracture -I discussed the case with Ortho--Dr. Xu--he will see patient -toe touch weight bearing only per Dr. Erlinda Hong -maintain cam boot for now -PT/OT eval -consult case management and social work as pt is the sole care given for essentially invalid husband CKD stage II -Baseline creatinine 0.8-1.0 -Patient has solitary kidney -Status post nephrectomy for RCC Chronic respiratory failure  -The patient is maintained on 2 L nasal cannula at nighttime  Hypothyroidism  -Continue Synthroid   hypertension -Continue losartan/ARB Depression/anxiety -Continue Prozac and Xanax and trazodone       Past Medical History  Diagnosis Date  . HTN (hypertension)   . Renal cell cancer   . Glaucoma   . Hypothyroidism   . Colon adenoma   . Chronic kidney disease   . Depression    Past Surgical History    Procedure Laterality Date  . Total abdominal hysterectomy    . Kidney surgery    . Replacement total knee     Social History:  reports that she has never smoked. She does not have any smokeless tobacco history on file. She reports that she does not drink alcohol or use illicit drugs.   Family History  Problem Relation Age of Onset  . Heart disease Father     due to small pox as a [place]  . Lung cancer Paternal Grandfather     lived to age 79     Allergies  Allergen Reactions  . Levaquin [Levofloxacin] Hives  . Hydrocodone-Acetaminophen Other (See Comments)    DRY MUCOUS MENBRANES  . Oxycodone Other (See Comments)    DRY MUCOUS MEMBRANES  . Other Rash  . Quinolones Rash      Prior to Admission medications   Medication Sig Start Date End Date Taking? Authorizing Provider  alprazolam Duanne Moron) 2 MG tablet Take 2 mg by mouth at bedtime.    Yes Historical Provider, MD  Ascorbic Acid (VITAMIN C) 500 MG CAPS Take 500 mg by mouth daily.    Yes Historical Provider, MD  Calcium Carb-Cholecalciferol (CALCIUM + D3) 600-200 MG-UNIT TABS Take 1 tablet by mouth 2 (two) times daily.    Yes Historical Provider, MD  FLUoxetine (PROZAC) 20 MG capsule Take 20 mg by mouth every morning.  03/11/14  Yes Historical Provider, MD  Glucosamine-Chondroitin (GLUCOSAMINE CHONDR COMPLEX PO) Take 1 tablet by mouth 2 (two) times daily.    Yes Historical Provider, MD  latanoprost (XALATAN) 0.005 % ophthalmic solution Place 1 drop into both eyes at  bedtime.   Yes Historical Provider, MD  levothyroxine (SYNTHROID, LEVOTHROID) 75 MCG tablet Take 75 mcg by mouth daily before breakfast.   Yes Historical Provider, MD  losartan (COZAAR) 100 MG tablet Take 100 mg by mouth every morning.  03/14/14  Yes Historical Provider, MD  Multiple Vitamin (MULTI-VITAMINS) TABS Take 1 tablet by mouth daily.    Yes Historical Provider, MD  omeprazole (PRILOSEC) 20 MG capsule Take by mouth daily.    Yes Historical Provider, MD   traZODone (DESYREL) 50 MG tablet Take 1 tablet by mouth at bedtime.  03/11/14  Yes Historical Provider, MD    Review of Systems:  Constitutional:  No weight loss, night sweats, Fevers, chills, fatigue.  Head&Eyes: No headache.  No vision loss.  No eye pain or scotoma ENT:  No Difficulty swallowing,Tooth/dental problems,Sore throat,  No ear ache, post nasal drip,  Cardio-vascular:  No chest pain, Orthopnea, PND, swelling in lower extremities,  dizziness, palpitations  GI:  No  abdominal pain, nausea, vomiting, diarrhea, loss of appetite, hematochezia, melena, heartburn, indigestion, Resp:  No shortness of breath with exertion or at rest. No cough. No coughing up of blood .No wheezing.No chest wall deformity  Skin:  no rash or lesions.  GU:  no dysuria, change in color of urine, no urgency or frequency. No flank pain.  Musculoskeletal:  Complains of right ankle pain Psych:  No change in mood or affect. Neurologic: No headache, no dysesthesia, no focal weakness, no vision loss. No syncope  Physical Exam: Filed Vitals:   08/31/14 0859 08/31/14 1107  BP: 154/71 124/80  Pulse: 72 65  Temp: 98.2 F (36.8 C)   TempSrc: Oral   Resp: 18 20  SpO2: 95% 97%   General:  A&O x 3, NAD, nontoxic, pleasant/cooperative Head/Eye: No conjunctival hemorrhage, no icterus, [place]/AT, No nystagmus ENT:  No icterus,  No thrush, good dentition, no pharyngeal exudate Neck:  No masses, no lymphadenpathy, no bruits CV:  RRR, no rub, no gallop, no S3 Lung:  CTAB, good air movement, no wheeze, no rhonchi Abdomen: soft/NT, +BS, nondistended, no peritoneal signs Ext: No cyanosis, No rashes, No petechiae, No lymphangitis, No edema;  R-foot in cam boot   Labs on Admission:  Basic Metabolic Panel:  Recent Labs Lab 08/31/14 1038  NA 139  K 4.4  CL 100  CO2 27  GLUCOSE 107*  BUN 18  CREATININE 0.99  CALCIUM 9.8   Liver Function Tests:  Recent Labs Lab 08/31/14 1038  AST 34  ALT 38*   ALKPHOS 92  BILITOT 0.3  PROT 7.7  ALBUMIN 3.7   No results found for this basename: LIPASE, AMYLASE,  in the last 168 hours No results found for this basename: AMMONIA,  in the last 168 hours CBC:  Recent Labs Lab 08/31/14 1038  WBC 8.6  NEUTROABS 5.7  HGB 14.4  HCT 44.9  MCV 95.1  PLT 266   Cardiac Enzymes: No results found for this basename: CKTOTAL, CKMB, CKMBINDEX, TROPONINI,  in the last 168 hours BNP: No components found with this basename: POCBNP,  CBG: No results found for this basename: GLUCAP,  in the last 168 hours  Radiological Exams on Admission: Dg Ankle Complete Right  08/31/2014   CLINICAL DATA:  Tripped and fell with right ankle pain.  EXAM: RIGHT ANKLE - COMPLETE 3+ VIEW  COMPARISON:  None.  FINDINGS: There is diffuse soft tissue swelling over the ankle. There is a displaced oblique fracture of the distal fibular diametaphyseal region  above the ankle mortise. There is a displaced transverse fracture of the medial malleolus. Subtle medial subluxation of the tibia on the talus. Nonspecific soft tissue calcifications posterior to the distal tibia. Bone island over the posterior calcaneus. Mild degenerate changes over the midfoot.  IMPRESSION: Displaced oblique distal fibular diametaphyseal fracture as well as displaced transverse fracture of the medial malleolus. Subtle medial subluxation of the distal tibia on the talus.   Electronically Signed   By: Marin Olp M.D.   On: 08/31/2014 10:05    EKG: Independently reviewed. Sinus rhythm, no ST-T wave changes    Time spent:60 minutes Code Status:   FULL Family Communication:   Daughters updated at bedside   Evrett Hakim, DO  [facility] Pager [phone]  If 7PM-7AM, please contact night-coverage [email or web address] Password [email or web address] 08/31/2014, 3:26 PM

## 2014-08-31 NOTE — ED Notes (Signed)
Pt has swelling to R ankle. Pt can wiggle toes, has good pulses and cap refill WNL.

## 2014-08-31 NOTE — Progress Notes (Signed)
Orthopedic Tech Progress Note Patient Details:  MAKYNLI STILLS 1940/11/15 [id number] Applied CAM waker to RLE.  Pulses, sensation, motion intact before and after application.  Capillary refill less than 2 seconds before and after application. Ortho Devices Type of Ortho Device: CAM walker Ortho Device/Splint Location: RLE Ortho Device/Splint Interventions: Application   Darrol Poke 08/31/2014, 12:57 PM

## 2014-08-31 NOTE — ED Notes (Signed)
Bed: WA17 Expected date:  Expected time:  Means of arrival:  Comments: ems- ankle injury

## 2014-08-31 NOTE — ED Notes (Signed)
Pt. Was given a [place] sandwich with a coke. Nurse was notified.

## 2014-08-31 NOTE — ED Notes (Signed)
Pt family requests to speak to Dr about concerns with pt and family situation. Pt takes care of her spouse that has stage 4 CA and there are no other care givers available to help. Dr. Vanita Panda at bedside talking to family about plan for pt and giving them resources for their family.

## 2014-08-31 NOTE — ED Notes (Signed)
Secretary notified to page ortho tech to apply splint to injured ankle

## 2014-08-31 NOTE — Progress Notes (Signed)
I spoke with Ortho, Dr. Erlinda Hong.  He stated that he will operate on the patient.  Patient indicated she wanted to have this done ASAP due to care issues regarding her husband at home. May not get operated until 09/02/14 at [facility] due to OR schedule.  Therefore, Dr. Erlinda Hong requested transfer to [facility] so that pt can have operation on 09/01/14.  DTat

## 2014-08-31 NOTE — ED Notes (Signed)
Dr Tat at bedside 

## 2014-08-31 NOTE — Progress Notes (Signed)
Report called to [facility] # [phone] for upcoming transfer of the pt to [facility].Pts dtr Holly at [phone] aware pt is to be transferred to [facility] [place].Pt is in stable condition Dilaudid .5 mg IV given at 2115 for c/o r ankle pain with relief.VSS.Consent for surgery signed.Refusal of any blood products signed.Surgical PCR sent to lab.Aggie Moats D

## 2014-08-31 NOTE — ED Notes (Signed)
Called Ortho tech to come fit pt for cam walker

## 2014-08-31 NOTE — Consult Note (Signed)
ORTHOPAEDIC CONSULTATION  REQUESTING PHYSICIAN: Orson Eva, MD  Chief Complaint: right ankle fx  HPI: Tammie [name] is a 73 y.o. female who complains of right ankle fx s/p mechanical fall at home.  She is active and is the primary caregiver of her invalid husband.  Had immediate onset of pain in ankle s/p fall.  xrays show displaced bimalleolar ankle fx.  Ortho consulted.  Past Medical History  Diagnosis Date  . HTN (hypertension)   . Renal cell cancer   . Glaucoma   . Hypothyroidism   . Colon adenoma   . Chronic kidney disease   . Depression    Past Surgical History  Procedure Laterality Date  . Total abdominal hysterectomy    . Kidney surgery    . Replacement total knee    . Joint replacement     History   Social History  . Marital Status: Married    Spouse Name: N/A    Number of Children: N/A  . Years of Education: N/A   Social History Main Topics  . Smoking status: Never Smoker   . Smokeless tobacco: None  . Alcohol Use: No  . Drug Use: No  . Sexual Activity: None   Other Topics Concern  . None   Social History Narrative  . None   Family History  Problem Relation Age of Onset  . Heart disease Father     due to small pox as a [place]  . Lung cancer Paternal Grandfather     lived to age 79   Allergies  Allergen Reactions  . Levaquin [Levofloxacin] Hives  . Hydrocodone-Acetaminophen Other (See Comments)    DRY MUCOUS MENBRANES  . Oxycodone Other (See Comments)    DRY MUCOUS MEMBRANES  . Other Rash  . Quinolones Rash   Prior to Admission medications   Medication Sig Start Date End Date Taking? Authorizing Provider  alprazolam Duanne Moron) 2 MG tablet Take 2 mg by mouth at bedtime.    Yes Historical Provider, MD  Ascorbic Acid (VITAMIN C) 500 MG CAPS Take 500 mg by mouth daily.    Yes Historical Provider, MD  Calcium Carb-Cholecalciferol (CALCIUM + D3) 600-200 MG-UNIT TABS Take 1 tablet by mouth 2 (two) times daily.    Yes Historical Provider, MD    FLUoxetine (PROZAC) 20 MG capsule Take 20 mg by mouth every morning.  03/11/14  Yes Historical Provider, MD  Glucosamine-Chondroitin (GLUCOSAMINE CHONDR COMPLEX PO) Take 1 tablet by mouth 2 (two) times daily.    Yes Historical Provider, MD  latanoprost (XALATAN) 0.005 % ophthalmic solution Place 1 drop into both eyes at bedtime.   Yes Historical Provider, MD  levothyroxine (SYNTHROID, LEVOTHROID) 75 MCG tablet Take 75 mcg by mouth daily before breakfast.   Yes Historical Provider, MD  losartan (COZAAR) 100 MG tablet Take 100 mg by mouth every morning.  03/14/14  Yes Historical Provider, MD  Multiple Vitamin (MULTI-VITAMINS) TABS Take 1 tablet by mouth daily.    Yes Historical Provider, MD  omeprazole (PRILOSEC) 20 MG capsule Take by mouth daily.    Yes Historical Provider, MD  traZODone (DESYREL) 50 MG tablet Take 1 tablet by mouth at bedtime.  03/11/14  Yes Historical Provider, MD   Dg Ankle Complete Right  08/31/2014   CLINICAL DATA:  Tripped and fell with right ankle pain.  EXAM: RIGHT ANKLE - COMPLETE 3+ VIEW  COMPARISON:  None.  FINDINGS: There is diffuse soft tissue swelling over the ankle. There is a displaced oblique  fracture of the distal fibular diametaphyseal region above the ankle mortise. There is a displaced transverse fracture of the medial malleolus. Subtle medial subluxation of the tibia on the talus. Nonspecific soft tissue calcifications posterior to the distal tibia. Bone island over the posterior calcaneus. Mild degenerate changes over the midfoot.  IMPRESSION: Displaced oblique distal fibular diametaphyseal fracture as well as displaced transverse fracture of the medial malleolus. Subtle medial subluxation of the distal tibia on the talus.   Electronically Signed   By: Marin Olp M.D.   On: 08/31/2014 10:05    Positive ROS: All other systems have been reviewed and were otherwise negative with the exception of those mentioned in the HPI and as above.  Physical Exam: General:  Alert, no acute distress Cardiovascular: No pedal edema Respiratory: No cyanosis, no use of accessory musculature GI: No organomegaly, abdomen is soft and non-tender Skin: No lesions in the area of chief complaint Neurologic: Sensation intact distally Psychiatric: Patient is competent for consent with normal mood and affect Lymphatic: No axillary or cervical lymphadenopathy  MUSCULOSKELETAL:  - swelling of ankle - skin intact - NVI - foot wwp  Assessment: Right bimall ankle fx  Plan: - recommend surgical treatment of fx - patient's social situation does complicate post discharge plans - she wishes to proceed with surgery but would also like a second opinion from [facility] - transfer to [facility], appreciate hospitalist assistance - NPO after midnight  Thank you for the consult and the opportunity to see Tammie [name]  N. Eduard Roux, MD [place] 6:25 PM

## 2014-08-31 NOTE — ED Provider Notes (Signed)
CSN: [id number]     Arrival date & time 08/31/14  2297 History   First MD Initiated Contact with Patient 08/31/14 [phone]     Chief Complaint  Patient presents with  . Ankle Injury      HPI  Patient presents several hours after sustaining an injury to her right ankle. Patient twisted the ankle, and since that time has had pain persistently. No relief with rest, elevation. No loss of sensation or strength. No other injuries, no other complaints.   Past Medical History  Diagnosis Date  . HTN (hypertension)   . Renal cell cancer   . Glaucoma   . Hypothyroidism   . Colon adenoma   . Chronic kidney disease   . Depression    Past Surgical History  Procedure Laterality Date  . Total abdominal hysterectomy    . Kidney surgery    . Replacement total knee     Family History  Problem Relation Age of Onset  . Heart disease Father     due to small pox as a [place]  . Lung cancer Paternal Grandfather     lived to age 28   History  Substance Use Topics  . Smoking status: Never Smoker   . Smokeless tobacco: Not on file  . Alcohol Use: No   OB History   Grav Para Term Preterm Abortions TAB SAB Ect Mult Living                 Review of Systems  All other systems reviewed and are negative.     Allergies  Levaquin; Hydrocodone-acetaminophen; Oxycodone; Other; and Quinolones  Home Medications   Prior to Admission medications   Medication Sig Start Date End Date Taking? Authorizing Provider  alprazolam Duanne Moron) 2 MG tablet Take 2 mg by mouth at bedtime.    Yes Historical Provider, MD  Ascorbic Acid (VITAMIN C) 500 MG CAPS Take 500 mg by mouth daily.    Yes Historical Provider, MD  Calcium Carb-Cholecalciferol (CALCIUM + D3) 600-200 MG-UNIT TABS Take 1 tablet by mouth 2 (two) times daily.    Yes Historical Provider, MD  FLUoxetine (PROZAC) 20 MG capsule Take 20 mg by mouth every morning.  03/11/14  Yes Historical Provider, MD  Glucosamine-Chondroitin (GLUCOSAMINE CHONDR  COMPLEX PO) Take 1 tablet by mouth 2 (two) times daily.    Yes Historical Provider, MD  latanoprost (XALATAN) 0.005 % ophthalmic solution Place 1 drop into both eyes at bedtime.   Yes Historical Provider, MD  levothyroxine (SYNTHROID, LEVOTHROID) 75 MCG tablet Take 75 mcg by mouth daily before breakfast.   Yes Historical Provider, MD  losartan (COZAAR) 100 MG tablet Take 100 mg by mouth every morning.  03/14/14  Yes Historical Provider, MD  Multiple Vitamin (MULTI-VITAMINS) TABS Take 1 tablet by mouth daily.    Yes Historical Provider, MD  omeprazole (PRILOSEC) 20 MG capsule Take by mouth daily.    Yes Historical Provider, MD  traZODone (DESYREL) 50 MG tablet Take 1 tablet by mouth at bedtime.  03/11/14  Yes Historical Provider, MD   BP 124/80  Pulse 65  Temp(Src) 98.2 F (36.8 C) (Oral)  Resp 20  SpO2 97% Physical Exam  Nursing note and vitals reviewed. Constitutional: She is oriented to person, place, and time. She appears well-developed and well-nourished. No distress.  HENT:  Head: Normocephalic and atraumatic.  Eyes: Conjunctivae and EOM are normal.  Cardiovascular: Normal rate, regular rhythm and intact distal pulses.   Pulmonary/Chest: Effort normal. No stridor. No  respiratory distress.  Abdominal: She exhibits no distension.  Musculoskeletal: She exhibits no edema.       Right knee: Normal.       Right ankle: She exhibits decreased range of motion and swelling. She exhibits no ecchymosis, no deformity, no laceration and normal pulse. Tenderness. Lateral malleolus and medial malleolus tenderness found. No AITFL, no CF ligament, no posterior TFL, no head of 5th metatarsal and no proximal fibula tenderness found.  Patient has preserved capacity to flex / extend / invert / evert the ankle.  Neurological: She is alert and oriented to person, place, and time. She displays no atrophy and no tremor. No cranial nerve deficit or sensory deficit. She exhibits normal muscle tone. She displays no  seizure activity.  Skin: Skin is warm and dry.  Psychiatric: She has a normal mood and affect.    ED Course  Procedures (including critical care time) Labs Review Labs Reviewed  COMPREHENSIVE METABOLIC PANEL - Abnormal; Notable for the following:    Glucose, Bld 107 (*)    ALT 38 (*)    GFR calc non Af Amer 54 (*)    GFR calc Af Amer 63 (*)    All other components within normal limits  CBC WITH DIFFERENTIAL    Imaging Review Dg Ankle Complete Right  08/31/2014   CLINICAL DATA:  Tripped and fell with right ankle pain.  EXAM: RIGHT ANKLE - COMPLETE 3+ VIEW  COMPARISON:  None.  FINDINGS: There is diffuse soft tissue swelling over the ankle. There is a displaced oblique fracture of the distal fibular diametaphyseal region above the ankle mortise. There is a displaced transverse fracture of the medial malleolus. Subtle medial subluxation of the tibia on the talus. Nonspecific soft tissue calcifications posterior to the distal tibia. Bone island over the posterior calcaneus. Mild degenerate changes over the midfoot.  IMPRESSION: Displaced oblique distal fibular diametaphyseal fracture as well as displaced transverse fracture of the medial malleolus. Subtle medial subluxation of the distal tibia on the talus.   Electronically Signed   By: Marin Olp M.D.   On: 08/31/2014 10:05     EKG Interpretation   Date/Time:  Sunday August 31 2014 11:06:25 EDT Ventricular Rate:  72 PR Interval:  172 QRS Duration: 79 QT Interval:  450 QTC Calculation: 492 R Axis:   32 Text Interpretation:  Sinus rhythm Low voltage, precordial leads  Borderline prolonged QT interval Abnormal ekg Confirmed by Carmin Muskrat  MD ([id number]) on 08/31/2014 2:55:55 PM     2:54 PM Patient awake and alert, she is moving her toes without pain.  I again discussed the patient's care with our orthopedics team.   Patient remains unwilling/unable to ambulate. She states that she is not comfortable going home.  MDM    Patient presents after a mechanical fall with subsequent ankle fracture. Patient is not comfortable going home, not able to ambulate.  The patient is distally neurovascularly intact and has no other complaints.  After immobilization here with the assistance of our orthopedic techs, the patient was admitted due to her non-ambulatory status and new fracture.    Carmin Muskrat, MD 08/31/14 [phone]

## 2014-08-31 NOTE — ED Notes (Signed)
Pt from home via EMS- Per EMS, pt fell over her shoes after waking this am. Pt twisted her R ankle. Pt has significant swelling to R ankle, but no deformity noted. Pt denies LOC, hitting head or other injury. Pt is A&O and in NAD

## 2014-08-31 NOTE — H&P (Signed)

## 2014-08-31 NOTE — ED Notes (Signed)
Dr Vanita Panda at bedside updating family.

## 2014-08-31 NOTE — ED Notes (Signed)
Patient transported to X-ray 

## 2014-09-01 ENCOUNTER — Encounter (HOSPITAL_COMMUNITY): Payer: Self-pay | Admitting: Anesthesiology

## 2014-09-01 ENCOUNTER — Encounter (HOSPITAL_COMMUNITY): Payer: Medicare Other | Admitting: Anesthesiology

## 2014-09-01 ENCOUNTER — Encounter (HOSPITAL_COMMUNITY)
Admission: EM | Disposition: A | Discharge status: Home Health | Payer: Self-pay | Source: Home / Self Care | Attending: Internal Medicine

## 2014-09-01 ENCOUNTER — Inpatient Hospital Stay (HOSPITAL_COMMUNITY): Payer: Medicare Other | Admitting: Anesthesiology

## 2014-09-01 ENCOUNTER — Inpatient Hospital Stay (HOSPITAL_COMMUNITY): Payer: Medicare Other

## 2014-09-01 DIAGNOSIS — I10 Essential (primary) hypertension: Secondary | ICD-10-CM

## 2014-09-01 HISTORY — PX: ORIF ANKLE FRACTURE: SHX5408

## 2014-09-01 LAB — SURGICAL PCR SCREEN
MRSA, PCR: NEGATIVE
STAPHYLOCOCCUS AUREUS: NEGATIVE

## 2014-09-01 SURGERY — OPEN REDUCTION INTERNAL FIXATION (ORIF) ANKLE FRACTURE
Anesthesia: Monitor Anesthesia Care | Site: Ankle | Laterality: Right

## 2014-09-01 SURGERY — Surgical Case
Anesthesia: *Unknown

## 2014-09-01 MED ORDER — METHOCARBAMOL 1000 MG/10ML IJ SOLN
500.0000 mg | Freq: Four times a day (QID) | INTRAVENOUS | Status: DC | PRN
  Filled 2014-09-01: qty 5

## 2014-09-01 MED ORDER — MIDAZOLAM HCL 2 MG/2ML IJ SOLN
1.0000 mg | INTRAMUSCULAR | Status: DC | PRN
  Administered 2014-09-01: 1 mg via INTRAVENOUS

## 2014-09-01 MED ORDER — EPHEDRINE SULFATE 50 MG/ML IJ SOLN
INTRAMUSCULAR | Status: DC | PRN
  Administered 2014-09-01 (×3): 5 mg via INTRAVENOUS

## 2014-09-01 MED ORDER — SODIUM CHLORIDE 0.9 % IV SOLN
INTRAVENOUS | Status: DC
  Administered 2014-09-01 – 2014-09-02 (×2): via INTRAVENOUS

## 2014-09-01 MED ORDER — MIDAZOLAM HCL 2 MG/2ML IJ SOLN
INTRAMUSCULAR | Status: AC
  Administered 2014-09-01: 1 mg via INTRAVENOUS
  Filled 2014-09-01: qty 2

## 2014-09-01 MED ORDER — ROPIVACAINE HCL 5 MG/ML IJ SOLN
INTRAMUSCULAR | Status: DC | PRN
  Administered 2014-09-01: 15 mL via PERINEURAL
  Administered 2014-09-01: 30 mL via PERINEURAL

## 2014-09-01 MED ORDER — SODIUM CHLORIDE 0.9 % IV SOLN
INTRAVENOUS | Status: DC
Start: 2014-09-01 — End: 2014-09-02
  Administered 2014-09-01: 19:00:00 via INTRAVENOUS

## 2014-09-01 MED ORDER — ONDANSETRON HCL 4 MG/2ML IJ SOLN: 4.0000 mg | Freq: Four times a day (QID) | INTRAMUSCULAR | Status: DC | PRN

## 2014-09-01 MED ORDER — LACTATED RINGERS IV SOLN
INTRAVENOUS | Status: DC | PRN
  Administered 2014-09-01: 15:00:00 via INTRAVENOUS

## 2014-09-01 MED ORDER — FENTANYL CITRATE 0.05 MG/ML IJ SOLN
50.0000 ug | INTRAMUSCULAR | Status: DC | PRN
  Administered 2014-09-01: 50 ug via INTRAVENOUS

## 2014-09-01 MED ORDER — PROPOFOL 10 MG/ML IV BOLUS
INTRAVENOUS | Status: AC
  Filled 2014-09-01: qty 20

## 2014-09-01 MED ORDER — CEFAZOLIN SODIUM-DEXTROSE 2-3 GM-% IV SOLR
2.0000 g | Freq: Four times a day (QID) | INTRAVENOUS | Status: AC
Start: 2014-09-01 — End: 2014-09-02
  Administered 2014-09-01 – 2014-09-02 (×3): 2 g via INTRAVENOUS
  Filled 2014-09-01 (×3): qty 50

## 2014-09-01 MED ORDER — SORBITOL 70 % SOLN: 30.0000 mL | Freq: Every day | Status: DC | PRN

## 2014-09-01 MED ORDER — MAGNESIUM CITRATE PO SOLN: 1.0000 | Freq: Once | ORAL | Status: AC | PRN

## 2014-09-01 MED ORDER — POLYETHYLENE GLYCOL 3350 17 G PO PACK: 17.0000 g | PACK | Freq: Every day | ORAL | Status: DC | PRN

## 2014-09-01 MED ORDER — METOCLOPRAMIDE HCL 10 MG PO TABS: 5.0000 mg | ORAL_TABLET | Freq: Three times a day (TID) | ORAL | Status: DC | PRN

## 2014-09-01 MED ORDER — CEFAZOLIN SODIUM 1-5 GM-% IV SOLN
INTRAVENOUS | Status: DC | PRN
  Administered 2014-09-01: 2 g via INTRAVENOUS

## 2014-09-01 MED ORDER — KETOROLAC TROMETHAMINE 30 MG/ML IJ SOLN
30.0000 mg | Freq: Four times a day (QID) | INTRAMUSCULAR | Status: DC | PRN
  Administered 2014-09-02: 30 mg via INTRAVENOUS
  Filled 2014-09-01: qty 1

## 2014-09-01 MED ORDER — DIPHENHYDRAMINE HCL 12.5 MG/5ML PO ELIX: 25.0000 mg | ORAL_SOLUTION | ORAL | Status: DC | PRN

## 2014-09-01 MED ORDER — FENTANYL CITRATE 0.05 MG/ML IJ SOLN
INTRAMUSCULAR | Status: AC
  Administered 2014-09-01: 50 ug via INTRAVENOUS
  Filled 2014-09-01: qty 2

## 2014-09-01 MED ORDER — SENNA 8.6 MG PO TABS
1.0000 | ORAL_TABLET | Freq: Two times a day (BID) | ORAL | Status: DC
Start: 2014-09-01 — End: 2014-09-04
  Administered 2014-09-01 – 2014-09-03 (×4): 8.6 mg via ORAL
  Filled 2014-09-01 (×7): qty 1

## 2014-09-01 MED ORDER — PROPOFOL INFUSION 10 MG/ML OPTIME
INTRAVENOUS | Status: DC | PRN
  Administered 2014-09-01: 50 ug/kg/min via INTRAVENOUS

## 2014-09-01 MED ORDER — ONDANSETRON HCL 4 MG PO TABS: 4.0000 mg | ORAL_TABLET | Freq: Four times a day (QID) | ORAL | Status: DC | PRN

## 2014-09-01 MED ORDER — ASPIRIN EC 325 MG PO TBEC
325.0000 mg | DELAYED_RELEASE_TABLET | Freq: Two times a day (BID) | ORAL | Status: DC
Start: 2014-09-01 — End: 2014-09-04
  Filled 2014-09-01 (×8): qty 1

## 2014-09-01 MED ORDER — BUPIVACAINE HCL (PF) 0.25 % IJ SOLN
INTRAMUSCULAR | Status: AC
  Filled 2014-09-01: qty 30

## 2014-09-01 MED ORDER — LACTATED RINGERS IV SOLN
INTRAVENOUS | Status: DC
  Administered 2014-09-01: 14:00:00 via INTRAVENOUS

## 2014-09-01 MED ORDER — 0.9 % SODIUM CHLORIDE (POUR BTL) OPTIME
TOPICAL | Status: DC | PRN
  Administered 2014-09-01: 1000 mL

## 2014-09-01 MED ORDER — METHOCARBAMOL 500 MG PO TABS
500.0000 mg | ORAL_TABLET | Freq: Four times a day (QID) | ORAL | Status: DC | PRN
  Administered 2014-09-02 – 2014-09-04 (×6): 500 mg via ORAL
  Filled 2014-09-01 (×6): qty 1

## 2014-09-01 MED ORDER — METOCLOPRAMIDE HCL 5 MG/ML IJ SOLN: 5.0000 mg | Freq: Three times a day (TID) | INTRAMUSCULAR | Status: DC | PRN

## 2014-09-01 SURGICAL SUPPLY — 73 items
BANDAGE ELASTIC 4 VELCRO ST LF (GAUZE/BANDAGES/DRESSINGS) IMPLANT
BANDAGE ELASTIC 6 VELCRO ST LF (GAUZE/BANDAGES/DRESSINGS) ×2 IMPLANT
BIT DRILL 2.7 QC CANN 155 (BIT) ×1 IMPLANT
BIT DRILL 2.7 QC CANN 155MM (BIT) ×1
BIT DRILL 3.5 QC 155 (BIT) ×1 IMPLANT
BIT DRILL 3.5 QC 155MM (BIT) ×1
BIT DRILL QC 2.7 6.3IN  SHORT (BIT) ×2
BIT DRILL QC 2.7 6.3IN SHORT (BIT) IMPLANT
BNDG COHESIVE 4X5 TAN STRL (GAUZE/BANDAGES/DRESSINGS) ×1 IMPLANT
BNDG COHESIVE 6X5 TAN STRL LF (GAUZE/BANDAGES/DRESSINGS) ×3 IMPLANT
COVER SURGICAL LIGHT HANDLE (MISCELLANEOUS) ×3 IMPLANT
CUFF TOURNIQUET SINGLE 34IN LL (TOURNIQUET CUFF) ×2 IMPLANT
CUFF TOURNIQUET SINGLE 44IN (TOURNIQUET CUFF) IMPLANT
DRAPE C-ARM 42X72 X-RAY (DRAPES) ×3 IMPLANT
DRAPE C-ARMOR (DRAPES) ×3 IMPLANT
DRAPE IMP U-DRAPE 54X76 (DRAPES) ×3 IMPLANT
DRAPE INCISE IOBAN 66X45 STRL (DRAPES) ×3 IMPLANT
DRAPE U-SHAPE 47X51 STRL (DRAPES) ×4 IMPLANT
DURAPREP 26ML APPLICATOR (WOUND CARE) ×3 IMPLANT
ELECT CAUTERY BLADE 6.4 (BLADE) ×3 IMPLANT
ELECT REM PT RETURN 9FT ADLT (ELECTROSURGICAL) ×3
ELECTRODE REM PT RTRN 9FT ADLT (ELECTROSURGICAL) ×1 IMPLANT
FACESHIELD WRAPAROUND (MASK) ×3 IMPLANT
FACESHIELD WRAPAROUND OR TEAM (MASK) ×1 IMPLANT
GAUZE SPONGE 4X4 12PLY STRL (GAUZE/BANDAGES/DRESSINGS) ×3 IMPLANT
GAUZE XEROFORM 5X9 LF (GAUZE/BANDAGES/DRESSINGS) ×3 IMPLANT
GLOVE SURG SYN 7.5  E (GLOVE) ×4
GLOVE SURG SYN 7.5 E (GLOVE) ×2 IMPLANT
GLOVE SURG SYN 7.5 PF PI (GLOVE) ×2 IMPLANT
GOWN STRL REIN XL XLG (GOWN DISPOSABLE) ×3 IMPLANT
GOWN STRL REUS W/ TWL LRG LVL3 (GOWN DISPOSABLE) ×2 IMPLANT
GOWN STRL REUS W/TWL LRG LVL3 (GOWN DISPOSABLE) ×3
GUIDE PIN 1.3 (Pin) ×6 IMPLANT
KIT BASIN OR (CUSTOM PROCEDURE TRAY) ×3 IMPLANT
KIT ROOM TURNOVER OR (KITS) ×3 IMPLANT
NDL HYPO 25GX1X1/2 BEV (NEEDLE) IMPLANT
NEEDLE HYPO 25GX1X1/2 BEV (NEEDLE) IMPLANT
NS IRRIG 1000ML POUR BTL (IV SOLUTION) ×3 IMPLANT
PACK ORTHO EXTREMITY (CUSTOM PROCEDURE TRAY) ×3 IMPLANT
PAD ARMBOARD 7.5X6 YLW CONV (MISCELLANEOUS) ×6 IMPLANT
PAD CAST 3X4 CTTN HI CHSV (CAST SUPPLIES) ×2 IMPLANT
PADDING CAST ABS 6INX4YD NS (CAST SUPPLIES) ×2
PADDING CAST ABS COTTON 6X4 NS (CAST SUPPLIES) IMPLANT
PADDING CAST COTTON 3X4 STRL (CAST SUPPLIES) ×6
PADDING CAST COTTON 6X4 STRL (CAST SUPPLIES) ×3 IMPLANT
PIN GUIDE 1.3 (Pin) IMPLANT
PLATE BONE 7-HOLE LOCK 86MM (Plate) ×2 IMPLANT
SCREW CANC 5.0X14 (Screw) ×2 IMPLANT
SCREW CANCELLOUS 5.0X16MM (Screw) ×2 IMPLANT
SCREW CANNULATED 4.0X35 (Screw) ×4 IMPLANT
SCREW CANNULATED 4.0X36 (Screw) ×2 IMPLANT
SCREW LOCK 10X3.5XST NS (Screw) IMPLANT
SCREW LOCK 3.5X10 (Screw) ×3 IMPLANT
SCREW LOCK 3.5X14 (Screw) ×2 IMPLANT
SCREW NL 3.5X14 (Screw) ×2 IMPLANT
SCREW NLCK 16X3.5XST CORT PRLC (Screw) IMPLANT
SCREW NONLOCK 3.5X10 (Screw) ×6 IMPLANT
SCREW NONLOCK 3.5X16 (Screw) ×3 IMPLANT
SPLINT FIBERGLASS 4X30 (CAST SUPPLIES) ×2 IMPLANT
SPONGE LAP 18X18 X RAY DECT (DISPOSABLE) ×6 IMPLANT
SUCTION FRAZIER TIP 10 FR DISP (SUCTIONS) ×3 IMPLANT
SUT ETHILON 2 0 FS 18 (SUTURE) IMPLANT
SUT ETHILON 3 0 PS 1 (SUTURE) ×4 IMPLANT
SUT VIC AB 0 CT1 27 (SUTURE) ×3
SUT VIC AB 0 CT1 27XBRD ANBCTR (SUTURE) IMPLANT
SUT VIC AB 2-0 CT1 27 (SUTURE) ×6
SUT VIC AB 2-0 CT1 TAPERPNT 27 (SUTURE) IMPLANT
SYR CONTROL 10ML LL (SYRINGE) IMPLANT
TOWEL OR 17X24 6PK STRL BLUE (TOWEL DISPOSABLE) ×3 IMPLANT
TOWEL OR 17X26 10 PK STRL BLUE (TOWEL DISPOSABLE) ×6 IMPLANT
TUBE CONNECTING 12'X1/4 (SUCTIONS) ×1
TUBE CONNECTING 12X1/4 (SUCTIONS) ×2 IMPLANT
WATER STERILE IRR 1000ML POUR (IV SOLUTION) ×3 IMPLANT

## 2014-09-01 NOTE — Anesthesia Postprocedure Evaluation (Signed)
  Anesthesia Post-op Note  Patient: Tammie [name]  Procedure(s) Performed: Procedure(s): OPEN REDUCTION INTERNAL FIXATION (ORIF) ANKLE FRACTURE (Right)  Patient Location: OR 3. Pt has been on PACU hold for an hour. Anesthesia Type:MAC and Regional  Level of Consciousness: awake, alert  and oriented  Airway and Oxygen Therapy: Patient Spontanous Breathing and Patient connected to nasal cannula oxygen  Post-op Pain: none  Post-op Assessment: Post-op Vital signs reviewed, Patient's Cardiovascular Status Stable and Respiratory Function Stable  Post-op Vital Signs: Reviewed and stable  Last Vitals:  Filed Vitals:   09/01/14 1515  BP: 116/54  Pulse: 61  Temp:   Resp: 11    Complications: No apparent anesthesia complications

## 2014-09-01 NOTE — Anesthesia Procedure Notes (Signed)
Anesthesia Regional Block:  Popliteal block  Pre-Anesthetic Checklist: ,, timeout performed, Correct Patient, Correct Site, Correct Laterality, Correct Procedure, Correct Position, site marked, Risks and benefits discussed,  Surgical consent,  Pre-op evaluation,  At surgeon's request and post-op pain management  Laterality: Lower and Right  Prep: chloraprep       Needles:  Injection technique: Single-shot  Needle Type: Echogenic Stimulator Needle          Additional Needles:  Procedures: ultrasound guided (picture in chart) Popliteal block  Nerve Stimulator or Paresthesia:  Response: plantarflexion, 0.5 mA,   Additional Responses:   Narrative:  Start time: 09/01/2014 3:01 PM End time: 09/01/2014 3:08 PM Injection made incrementally with aspirations every 5 mL.  Performed by: Personally  Anesthesiologist: Argentina Kosch  Additional Notes: H+P and labs reviewed, risks and benefits discussed with patient, procedure tolerated well without complications   Saphenous nerve block performed with 18ml injected, negative aspiration every 36ml.

## 2014-09-01 NOTE — Op Note (Signed)
Date of Surgery: 09/01/2014  INDICATIONS: Tammie [name] is a 73 y.o.-year-old female who sustained a right ankle fracture; she was indicated for open reduction and internal fixation due to the displaced nature of the articular fracture and came to the operating room today for this procedure. The patient and family did consent to the procedure after discussion of the risks and benefits.  PREOPERATIVE DIAGNOSIS: right bimalleolar ankle fracture  POSTOPERATIVE DIAGNOSIS: Same.  PROCEDURE: Open treatment of right ankle fracture with internal fixation. Lateral malleolar Bimalleolar CPT [id number].  SURGEON: N. Eduard Roux, M.D.  ASSIST: none.  ANESTHESIA:  regional  IV FLUIDS AND URINE: See anesthesia.  ESTIMATED BLOOD LOSS: minimal mL.  IMPLANTS: [employer] 7 hole VLP tubular plate  COMPLICATIONS: None.  DESCRIPTION OF PROCEDURE: The patient was brought to the operating room and placed supine on the operating table.  The patient had been signed prior to the procedure and this was documented. The patient had the anesthesia placed by the anesthesiologist.  A nonsterile tourniquet was placed on the upper thigh.  The prep verification and incision time-outs were performed to confirm that this was the correct patient, site, side and location. The patient had an SCD on the opposite lower extremity. The patient did receive antibiotics prior to the incision and was re-dosed during the procedure as needed at indicated intervals.  The patient had the lower extremity prepped and draped in the standard surgical fashion.  The extremity was exsanguinated using an esmarch bandage and the tourniquet was inflated to 250 mm Hg.  Bony landmarks were palpated on the lateral side of the ankle. We began with a lateral incision over the lateral malleolus. Blunt dissection was taken down to the fascia. The superficial peroneal nerve was encountered and protected and retracted during the entire procedure. The fascia  was sharply incised in line with the incision. The lateral aspect of the fibula was exposed. The fracture site was then exposed. Any entrapped periosteum an organized hematoma was debrided from the fracture site. The fracture was then reduced using a tenaculum clamp and manual maneuvers. With the fracture reduced we placed a 7-hole VLP semitubular plate on the lateral aspect of the fibula. An attempt was made to place a lag screw but given the poor quality of the bone the lag screw did not have any purchase. The decision was made to bridge the fracture. A series of locking and nonlocking screws were placed proximal and distal to the fracture. Once this was done x-rays are taken to confirm adequate placement. We then turned our attention to the medial malleolus fracture. A curvilinear longitudinal incision over the medial malleolus was used. Blunt dissection was taken down to the level of the fascia. The fascia was sharply incised. These saphenous structures were not encountered. The periosteum was taken out of the fracture site. The fracture was reduced under direct visualization. I then placed 2 parallel K wires through the medial malleolus and into the distal tibia. Their placement was confirmed on fluoroscopy. We then placed 2 fully threaded 4.0 mm cannulated screws over the K wires. Of note the patient had poor bone quality. The wounds were then irrigated thoroughly with normal saline. Final x-rays were taken. The wound was closed in layer fashion using 0 Vicryl for the fascia, 2-0 Vicryl for the deep skin layer, 3-0 nylon for the skin. Sterile dressings were applied and the extremity was immobilized in a short-leg splint. Patient tolerated the procedure well was transferred to the PACU in  stable condition.  POSTOPERATIVE PLAN:  patient will be touchdown weightbearing to the right lower extremity. She will be up with physical therapy in the morning. She'll be on aspirin for DVT prophylaxis. We'll see her back  in the office in 2 weeks for suture removal.  N. Eduard Roux, MD [place] 5:19 PM

## 2014-09-01 NOTE — Transfer of Care (Signed)
Immediate Anesthesia Transfer of Care Note  Patient: Tammie [name]  Procedure(s) Performed: Procedure(s): OPEN REDUCTION INTERNAL FIXATION (ORIF) ANKLE FRACTURE (Right)  Patient Location: Nursing Unit, 5N. Report given to [employer].    Anesthesia Type:MAC  Level of Consciousness: awake, alert , oriented and patient cooperative  Airway & Oxygen Therapy: Patient Spontanous Breathing and Patient connected to nasal cannula oxygen  Post-op Assessment: Report given to PACU RN and Post -op Vital signs reviewed and stable  Post vital signs: Reviewed and stable  Complications: No apparent anesthesia complications

## 2014-09-01 NOTE — Progress Notes (Signed)
OT Cancellation Note  Patient Details Name: Tammie [name] MRN: [id number] DOB: 1941-08-16   Cancelled Treatment:    Reason Eval/Treat Not Completed: Other (comment). Pt for ankle surgery today.   Almon Register [phone] 09/01/2014, 9:08 AM

## 2014-09-01 NOTE — Anesthesia Preprocedure Evaluation (Signed)
Anesthesia Evaluation  Patient identified by MRN, date of birth, ID band Patient awake    Reviewed: Allergy & Precautions, H&P , NPO status , Patient's Chart, lab work & pertinent test results  History of Anesthesia Complications Negative for: history of anesthetic complications  Airway Mallampati: III TM Distance: >3 FB Neck ROM: Full    Dental  (+) Teeth Intact   Pulmonary neg pulmonary ROS,  breath sounds clear to auscultation        Cardiovascular hypertension, Pt. on medications Rhythm:Regular     Neuro/Psych PSYCHIATRIC DISORDERS Anxiety Depression negative neurological ROS     GI/Hepatic negative GI ROS, Neg liver ROS,   Endo/Other  Hypothyroidism Morbid obesity  Renal/GU Renal diseaseSolitary kidney     Musculoskeletal Right ankle fracture   Abdominal   Peds  Hematology negative hematology ROS (+)   Anesthesia Other Findings   Reproductive/Obstetrics                           Anesthesia Physical Anesthesia Plan  ASA: III  Anesthesia Plan: MAC and Regional   Post-op Pain Management:    Induction: Intravenous  Airway Management Planned: Natural Airway and Simple Face Mask  Additional Equipment: None  Intra-op Plan:   Post-operative Plan:   Informed Consent: I have reviewed the patients History and Physical, chart, labs and discussed the procedure including the risks, benefits and alternatives for the proposed anesthesia with the patient or authorized representative who has indicated his/her understanding and acceptance.   Dental advisory given  Plan Discussed with: CRNA and Surgeon  Anesthesia Plan Comments:         Anesthesia Quick Evaluation

## 2014-09-01 NOTE — Progress Notes (Signed)
Utilization review completed.  

## 2014-09-01 NOTE — Progress Notes (Signed)
PT Cancellation Note  Patient Details Name: Tammie [name] MRN: [id number] DOB: 05-01-1941   Cancelled Treatment:    Reason Eval/Treat Not Completed: Patient not medically ready  For surgery today; Roney Marion, PT  Acute Rehabilitation Services Pager [phone] Office [phone]    Roney Marion [facility] 09/01/2014, 9:52 AM

## 2014-09-01 NOTE — Progress Notes (Signed)
[facility] PROGRESS NOTE  Tammie [name] LGX:[id number] DOB: 06-03-1941 DOA: 08/31/2014 PCP: [name], MD  Assessment/Plan: 73 y/o female with PMH fo HTN, RCC (s/p R nephrectomy), Hypothyroidism, Chronic respiratory failure (2L at night), presents after a mechanical fall at her home and found to have displaced right distal fibular fracture and displaced fracture of the medial malleolus with subtle medial subluxation of the distal tibia.   1. Right ankle fracture  -per orthopedics surgery is scheduled for today; NPO; gentle IVF periop -PT/OT eval; consult SW, CM pt is the sole care given for essentially disabled husband  2. CKD stage II h/o R nephrectomy due to [place]; cont monitoring  3. Chronic respiratory failure, no respiratory distress on exam   -The patient is maintained on 2 L nasal cannula at nighttime  4. Hypothyroidism Continue Synthroid  5. HTN hold losartan/ARB periop; cont monitor  6. Depression/anxiety  -Continue Prozac and Xanax and trazodone      Code Status: full Family Communication: d/w patient (indicate person spoken with, relationship, and if by phone, the number) Disposition Plan: home pend surgery    Consultants:  ortho  Procedures:  Pend surgery   Antibiotics:  periop per surgeyr  (indicate start date, and stop date if known)  HPI/Subjective: alert  Objective: Filed Vitals:   09/01/14 0500  BP: 117/54  Pulse: 67  Temp: 98.2 F (36.8 C)  Resp: 18    Intake/Output Summary (Last 24 hours) at 09/01/14 1048 Last data filed at 08/31/14 2100  Gross per 24 hour  Intake    240 ml  Output      0 ml  Net    240 ml   Filed Weights   08/31/14 2100 08/31/14 2325  Weight: 81.647 kg (180 lb) 84.823 kg (187 lb)    Exam:   General:  alert  Cardiovascular: s1,s2 rrr  Respiratory: CTA BL  Abdomen: soft, nt,nd  Musculoskeletal: R ankle edema    Data Reviewed: Basic Metabolic Panel:  Recent Labs Lab 08/31/14 1038   NA 139  K 4.4  CL 100  CO2 27  GLUCOSE 107*  BUN 18  CREATININE 0.99  CALCIUM 9.8   Liver Function Tests:  Recent Labs Lab 08/31/14 1038  AST 34  ALT 38*  ALKPHOS 92  BILITOT 0.3  PROT 7.7  ALBUMIN 3.7   No results found for this basename: LIPASE, AMYLASE,  in the last 168 hours No results found for this basename: AMMONIA,  in the last 168 hours CBC:  Recent Labs Lab 08/31/14 1038  WBC 8.6  NEUTROABS 5.7  HGB 14.4  HCT 44.9  MCV 95.1  PLT 266   Cardiac Enzymes: No results found for this basename: CKTOTAL, CKMB, CKMBINDEX, TROPONINI,  in the last 168 hours BNP (last 3 results) No results found for this basename: PROBNP,  in the last 8760 hours CBG: No results found for this basename: GLUCAP,  in the last 168 hours  Recent Results (from the past 240 hour(s))  SURGICAL PCR SCREEN     Status: None   Collection Time    08/31/14  9:50 PM      Result Value Ref Range Status   MRSA, PCR NEGATIVE  NEGATIVE Final   Staphylococcus aureus NEGATIVE  NEGATIVE Final   Comment:            The Xpert SA Assay (FDA     approved for NASAL specimens     in patients over 12 years of age),  is one component of     a comprehensive surveillance     program.  Test performance has     been validated by [facility] for patients greater     than or equal to 15 year old.     It is not intended     to diagnose infection nor to     guide or monitor treatment.     Studies: Dg Ankle Complete Right  08/31/2014   CLINICAL DATA:  Tripped and fell with right ankle pain.  EXAM: RIGHT ANKLE - COMPLETE 3+ VIEW  COMPARISON:  None.  FINDINGS: There is diffuse soft tissue swelling over the ankle. There is a displaced oblique fracture of the distal fibular diametaphyseal region above the ankle mortise. There is a displaced transverse fracture of the medial malleolus. Subtle medial subluxation of the tibia on the talus. Nonspecific soft tissue calcifications posterior to the distal  tibia. Bone island over the posterior calcaneus. Mild degenerate changes over the midfoot.  IMPRESSION: Displaced oblique distal fibular diametaphyseal fracture as well as displaced transverse fracture of the medial malleolus. Subtle medial subluxation of the distal tibia on the talus.   Electronically Signed   By: Marin Olp M.D.   On: 08/31/2014 10:05    Scheduled Meds: . alprazolam  2 mg Oral QHS  . calcium-vitamin D  1 tablet Oral BID  . docusate sodium  100 mg Oral BID  . FLUoxetine  20 mg Oral Daily  . latanoprost  1 drop Both Eyes QHS  . levothyroxine  75 mcg Oral QAC breakfast  . losartan  100 mg Oral Daily  . multivitamin with minerals  1 tablet Oral Daily  . pantoprazole  40 mg Oral Daily  . traZODone  50 mg Oral QHS  . vitamin C  500 mg Oral Daily   Continuous Infusions:   Active Problems:   Ankle fracture, bimalleolar, closed   CKD (chronic kidney disease) stage 2, GFR 60-89 ml/min   Uncontrolled pain   Hypothyroidism    Time spent: >35 minutes     Kinnie Feil  [facility] Pager [phone]. If 7PM-7AM, please contact night-coverage at [email or web address], password [facility] 09/01/2014, 10:48 AM  LOS: 1 day

## 2014-09-01 NOTE — OR Nursing (Signed)
Pt brought to 5N due to PACU hold

## 2014-09-02 ENCOUNTER — Encounter (HOSPITAL_COMMUNITY): Payer: Self-pay | Admitting: Orthopaedic Surgery

## 2014-09-02 MED ORDER — LOSARTAN POTASSIUM 50 MG PO TABS
100.0000 mg | ORAL_TABLET | Freq: Every day | ORAL | Status: DC
  Administered 2014-09-02 – 2014-09-04 (×2): 100 mg via ORAL
  Filled 2014-09-02 (×3): qty 2

## 2014-09-02 NOTE — Evaluation (Addendum)
Occupational Therapy Evaluation Patient Details Name: Tammie [name] MRN: [id number] DOB: [birth year] Today's Date: 09/02/2014    History of Present Illness Pt with PMH fo HTN, RCC (s/p R nephrectomy), Hypothyroidism, Chronic respiratory failure (2L at night), presents after a mechanical fall at her home and found to have displaced right distal fibular fracture and displaced fracture of the medial malleolus with subtle medial subluxation of the distal tibia. s/p R ankle ORIF and TDWB   Clinical Impression   Pt s/p above. Pt independent with ADLs, PTA. Feel pt will benefit from acute OT to increase independence prior to d/c. Recommending CIR for rehab.    Follow Up Recommendations  CIR    Equipment Recommendations  None recommended by OT    Recommendations for Other Services       Precautions / Restrictions Precautions Precautions: Fall Required Braces or Orthoses: Other Brace/Splint Other Brace/Splint: CAM boot in room for use once cast removed. (Pt was using prior to ORIF) Restrictions Weight Bearing Restrictions: Yes RLE Weight Bearing: Touchdown weight bearing      Mobility Bed Mobility               General bed mobility comments: sitting EOB  Transfers Overall transfer level: Needs assistance Equipment used: Rolling walker (2 wheeled) Transfers: Sit to/from Stand Sit to Stand: Min assist         General transfer comment: cues for hand placement and demonstrated safety concern of this for pt.     Balance                                            ADL Overall ADL's : Needs assistance/impaired     Grooming: Wash/dry hands;Standing;Moderate assistance (balance and to try to maintain WB status)           Upper Body Dressing : Set up;Sitting   Lower Body Dressing: Moderate assistance;Sit to/from stand  Lower Body Bathing: Moderate assistance; Sit to/from stand   Toilet Transfer: Moderate assistance;Ambulation;RW;Comfort  height toilet;Grab bars   Toileting- Clothing Manipulation and Hygiene: Sit to/from stand;Moderate assistance       Functional mobility during ADLs: Moderate assistance;Rolling walker General ADL Comments: Pt able to state dressing technique. Ambulated to bathroom (cues and assist to try to help maintain WB status on RLE-difficult to maintain). Performed grooming at sink and OT assisted with washing buttocks. Assist with balance at sink (Mod A) and to try to help pt maintain WB precautions.  Educated on safety. OT assisted pt with hygiene.  Discussed alternative technique for LB ADLs (leaning). Discussed CIR briefly. Discussed with pt slowing down.     Vision                     Perception     Praxis      Pertinent Vitals/Pain Pain Assessment: 0-10 Pain Score: 3  Pain Location: RLE Pain Descriptors / Indicators: Burning Pain Intervention(s): Repositioned     Hand Dominance     Extremity/Trunk Assessment Upper Extremity Assessment Upper Extremity Assessment: Generalized weakness   Lower Extremity Assessment Lower Extremity Assessment: Defer to PT evaluation       Communication Communication Communication: No difficulties   Cognition Arousal/Alertness: Awake/alert Behavior During Therapy: WFL for tasks assessed/performed Overall Cognitive Status: Within Functional Limits for tasks assessed  General Comments       Exercises Exercises: Other exercises Other Exercises Other Exercises: performed theraband exercises for bilateral UE's   Shoulder Instructions      Home Living Family/patient expects to be discharged to:: Private residence Living Arrangements: Spouse/significant other (Pt is caregiver for husband who has stage 4 CA) Available Help at Discharge: Other (Comment) (daughter can only assist this week and then no A available) Type of Home: House Home Access: Stairs to enter [employer] of Steps: 4-5 Entrance  Stairs-Rails: Can reach both Home Layout: Two level;1/2 bath on main level Alternate Level Stairs-Number of Steps: flight Alternate Level Stairs-Rails: Right;Left           Home Equipment: Walker - 2 wheels;Cane - single point;Crutches;Bedside commode;Other (comment) (shower hose she uses to wash off bottom)          Prior Functioning/Environment Level of Independence: Independent             OT Diagnosis: Generalized weakness;Acute pain   OT Problem List: Decreased strength;Impaired balance (sitting and/or standing);Decreased knowledge of use of DME or AE;Decreased knowledge of precautions;Pain;Decreased range of motion;Decreased activity tolerance   OT Treatment/Interventions: Self-care/ADL training;Therapeutic exercise;DME and/or AE instruction;Therapeutic activities;Patient/family education;Balance training    OT Goals(Current goals can be found in the care plan section) Acute Rehab OT Goals Patient Stated Goal: not stated OT Goal Formulation: With patient Time For Goal Achievement: 09/16/14 Potential to Achieve Goals: Good ADL Goals Pt Will Perform Lower Body Dressing: sit to/from stand;with set-up;with supervision Pt Will Transfer to Toilet: with min guard assist;ambulating Pt Will Perform Toileting - Clothing Manipulation and hygiene: sit to/from stand;with supervision Additional ADL Goal #1: Pt will be independent with HEP for bilateral UE's to increase strength.  OT Frequency: Min 2X/week   Barriers to D/C:            Co-evaluation              End of Session Equipment Utilized During Treatment: Gait belt;Rolling walker  Activity Tolerance: Patient tolerated treatment well Patient left: in chair;with call bell/phone within reach   Time: 1516-1540 (5 minutes on toilet) OT Time Calculation (min): 24 min  Charges:  OT General Charges $OT Visit: 1 Procedure OT Evaluation $Initial OT Evaluation Tier I: 1 Procedure OT Treatments $Self Care/Home  Management : 8-22 mins G-CodesBenito Mccreedy OTR/L [phone] 09/02/2014, 6:00 PM

## 2014-09-02 NOTE — Progress Notes (Signed)
Orthopedic Tech Progress Note Patient Details:  Tammie [name] [birth year] [id number]  Ortho Devices Type of Ortho Device: CAM walker Ortho Device/Splint Location: applied ohf to bed Ortho Device/Splint Interventions: Ordered;Application   Braulio Bosch 09/02/2014, 7:43 PM

## 2014-09-02 NOTE — Progress Notes (Signed)
[facility] PROGRESS NOTE  Tammie [name] SNK:[id number] DOB: 07-Jun-1941 DOA: 08/31/2014 PCP: [name], MD  Assessment/Plan: 73 y/o female with PMH fo HTN, RCC (s/p R nephrectomy), Hypothyroidism, Chronic respiratory failure (2L at night), presents after a mechanical fall at her home and found to have displaced right distal fibular fracture and displaced fracture of the medial malleolus with subtle medial subluxation of the distal tibia.   1. Right ankle fracture  -10/19 s/p ORIF;per ortho  -PT/OT eval-pend; consult SW, CM pt is the sole care given for essentially disabled husband; but she is still considering rehab if indicated   2. CKD stage II h/o R nephrectomy due to [place]; d/c ketorolac; cont monitoring  3. Chronic respiratory failure, no respiratory distress on exam   -The patient is maintained on 2 L nasal cannula at nighttime  4. Hypothyroidism Continue Synthroid  5. HTN resume losartan/ARB post op; cont monitor  6. Depression/anxiety  -Continue Prozac and Xanax and trazodone      Code Status: full Family Communication: d/w patient (indicate person spoken with, relationship, and if by phone, the number) Disposition Plan: pend PT    Consultants:  ortho  Procedures:  Pend surgery   Antibiotics:  periop per surgeyr  (indicate start date, and stop date if known)  HPI/Subjective: alert  Objective: Filed Vitals:   09/02/14 0519  BP: 135/56  Pulse: 76  Temp: 98.6 F (37 C)  Resp: 15    Intake/Output Summary (Last 24 hours) at 09/02/14 0919 Last data filed at 09/02/14 0849  Gross per 24 hour  Intake    740 ml  Output      0 ml  Net    740 ml   Filed Weights   08/31/14 2100 08/31/14 2325  Weight: 81.647 kg (180 lb) 84.823 kg (187 lb)    Exam:   General:  alert  Cardiovascular: s1,s2 rrr  Respiratory: CTA BL  Abdomen: soft, nt,nd  Musculoskeletal: R ankle edema    Data Reviewed: Basic Metabolic Panel:  Recent Labs Lab  08/31/14 1038  NA 139  K 4.4  CL 100  CO2 27  GLUCOSE 107*  BUN 18  CREATININE 0.99  CALCIUM 9.8   Liver Function Tests:  Recent Labs Lab 08/31/14 1038  AST 34  ALT 38*  ALKPHOS 92  BILITOT 0.3  PROT 7.7  ALBUMIN 3.7   No results found for this basename: LIPASE, AMYLASE,  in the last 168 hours No results found for this basename: AMMONIA,  in the last 168 hours CBC:  Recent Labs Lab 08/31/14 1038  WBC 8.6  NEUTROABS 5.7  HGB 14.4  HCT 44.9  MCV 95.1  PLT 266   Cardiac Enzymes: No results found for this basename: CKTOTAL, CKMB, CKMBINDEX, TROPONINI,  in the last 168 hours BNP (last 3 results) No results found for this basename: PROBNP,  in the last 8760 hours CBG: No results found for this basename: GLUCAP,  in the last 168 hours  Recent Results (from the past 240 hour(s))  SURGICAL PCR SCREEN     Status: None   Collection Time    08/31/14  9:50 PM      Result Value Ref Range Status   MRSA, PCR NEGATIVE  NEGATIVE Final   Staphylococcus aureus NEGATIVE  NEGATIVE Final   Comment:            The Xpert SA Assay (FDA     approved for NASAL specimens     in patients over  52 years of age),     is one component of     a comprehensive surveillance     program.  Test performance has     been validated by [employer] for patients greater     than or equal to 37 year old.     It is not intended     to diagnose infection nor to     guide or monitor treatment.     Studies: Dg Ankle Complete Right  09/01/2014   CLINICAL DATA:  Screw and plate fixation of right ankle fracture.  EXAM: DG C-ARM 61-120 MIN; RIGHT ANKLE - COMPLETE 3+ VIEW  TECHNIQUE: Three views from portable C-arm radiography were submitted.  CONTRAST:  No contrast.  FLUOROSCOPY TIME:  11 seconds  COMPARISON:  08/31/2014  FINDINGS: Sideplate and screw device fixates the distal fibular fracture. There are 2 screws reducing the medial malleolar fracture. The hardware components and fracture fragments  are in anatomic alignment.  IMPRESSION: 1. Status post ORIF of bimalleolar fractures.   Electronically Signed   By: Kerby Moors M.D.   On: 09/01/2014 17:08   Dg Ankle Complete Right  08/31/2014   CLINICAL DATA:  Tripped and fell with right ankle pain.  EXAM: RIGHT ANKLE - COMPLETE 3+ VIEW  COMPARISON:  None.  FINDINGS: There is diffuse soft tissue swelling over the ankle. There is a displaced oblique fracture of the distal fibular diametaphyseal region above the ankle mortise. There is a displaced transverse fracture of the medial malleolus. Subtle medial subluxation of the tibia on the talus. Nonspecific soft tissue calcifications posterior to the distal tibia. Bone island over the posterior calcaneus. Mild degenerate changes over the midfoot.  IMPRESSION: Displaced oblique distal fibular diametaphyseal fracture as well as displaced transverse fracture of the medial malleolus. Subtle medial subluxation of the distal tibia on the talus.   Electronically Signed   By: Marin Olp M.D.   On: 08/31/2014 10:05   Dg C-arm 1-60 Min  09/01/2014   CLINICAL DATA:  Screw and plate fixation of right ankle fracture.  EXAM: DG C-ARM 61-120 MIN; RIGHT ANKLE - COMPLETE 3+ VIEW  TECHNIQUE: Three views from portable C-arm radiography were submitted.  CONTRAST:  No contrast.  FLUOROSCOPY TIME:  11 seconds  COMPARISON:  08/31/2014  FINDINGS: Sideplate and screw device fixates the distal fibular fracture. There are 2 screws reducing the medial malleolar fracture. The hardware components and fracture fragments are in anatomic alignment.  IMPRESSION: 1. Status post ORIF of bimalleolar fractures.   Electronically Signed   By: Kerby Moors M.D.   On: 09/01/2014 17:08    Scheduled Meds: . alprazolam  2 mg Oral QHS  . aspirin EC  325 mg Oral BID  . calcium-vitamin D  1 tablet Oral BID  .  ceFAZolin (ANCEF) IV  2 g Intravenous Q6H  . docusate sodium  100 mg Oral BID  . FLUoxetine  20 mg Oral Daily  . latanoprost  1  drop Both Eyes QHS  . levothyroxine  75 mcg Oral QAC breakfast  . multivitamin with minerals  1 tablet Oral Daily  . pantoprazole  40 mg Oral Daily  . senna  1 tablet Oral BID  . traZODone  50 mg Oral QHS  . vitamin C  500 mg Oral Daily   Continuous Infusions: . sodium chloride 50 mL/hr at 09/02/14 0311  . sodium chloride 125 mL/hr at 09/01/14 1900  . lactated ringers 50 mL/hr  at 09/01/14 1421    Active Problems:   Ankle fracture, bimalleolar, closed   CKD (chronic kidney disease) stage 2, GFR 60-89 ml/min   Uncontrolled pain   Hypothyroidism    Time spent: >35 minutes     Kinnie Feil  [facility] Pager [phone]. If 7PM-7AM, please contact night-coverage at [email or web address], password [facility] 09/02/2014, 9:19 AM  LOS: 2 days

## 2014-09-02 NOTE — Progress Notes (Signed)
   Subjective:  Patient reports pain as moderate.  No events.  Objective:   VITALS:   Filed Vitals:   09/01/14 1515 09/01/14 1926 09/02/14 0148 09/02/14 0519  BP: 116/54 117/38 153/61 135/56  Pulse: 61 75 80 76  Temp:  98.4 F (36.9 C) 100 F (37.8 C) 98.6 F (37 C)  TempSrc:      Resp: 11 15 15 15   Height:      Weight:      SpO2: 98% 99% 93% 93%    Neurologically intact Neurovascular intact Sensation intact distally Intact pulses distally Dorsiflexion/Plantar flexion intact Incision: dressing C/D/I and no drainage No cellulitis present Compartment soft   Lab Results  Component Value Date   WBC 8.6 08/31/2014   HGB 14.4 08/31/2014   HCT 44.9 08/31/2014   MCV 95.1 08/31/2014   PLT 266 08/31/2014     Assessment/Plan:  1 Day Post-Op   - Expected postop acute blood loss anemia - will monitor for symptoms - Up with PT/OT - DVT ppx - SCDs, ambulation, aspirin - TDWB right lower extremity - Pain control - Discharge planning  [name] 09/02/2014, 9:06 AM [phone]

## 2014-09-02 NOTE — Progress Notes (Signed)
Patient would be an excellent candidate for CIR especially with current social situation.  Would highly recommend CIR for patient.  Azucena Cecil, MD [place] 1:03 PM

## 2014-09-02 NOTE — Evaluation (Signed)
Physical Therapy Evaluation Patient Details Name: TYRONZA HAPPE MRN: [id number] DOB: [birth year] Today's Date: 09/02/2014   History of Present Illness  Pt with PMH fo HTN, RCC (s/p R nephrectomy), Hypothyroidism, Chronic respiratory failure (2L at night), presents after a mechanical fall at her home and found to have displaced right distal fibular fracture and displaced fracture of the medial malleolus with subtle medial subluxation of the distal tibia. s/p R ankle ORIF and TDWB  Clinical Impression  Pt is motivated to return to independent level. Pt presents with impairments listed below and will benefit from skilled PT services to address these impairments and increase functional independence. Pt demonstrates difficulty with maintaining TDWB requiring cueing for techniques to adhere to precautions. Discussed concerns with d/c home due to home entry barriers (multiple steps) and no support at home as pt is primary caregiver for her husband. Pt in agreement with these concerns but hesitant to go to rehab facility due to there is no one to care for her husband. Pt's daughters live in [place] and are unable to stay longer than this week to assist with care of pt's husband. Recommending CIR or SNF for further rehab to increase pt's functional independence.   Follow Up Recommendations CIR;Supervision/Assistance - 24 hour    Equipment Recommendations  Wheelchair cushion (measurements PT);Wheelchair (measurements PT)    Recommendations for Other Services OT consult     Precautions / Restrictions Precautions Precautions: Fall Required Braces or Orthoses: Other Brace/Splint Other Brace/Splint: CAM boot in room for use once cast removed. (Pt was using prior to ORIF) Restrictions Weight Bearing Restrictions: Yes RLE Weight Bearing: Touchdown weight bearing      Mobility  Bed Mobility Overal bed mobility: Needs Assistance Bed Mobility: Supine to Sit     Supine to sit:  Supervision     General bed mobility comments: cues for technique  Transfers Overall transfer level: Needs assistance Equipment used: Rolling walker (2 wheeled) Transfers: Sit to/from Stand Sit to Stand: Min assist         General transfer comment: cues for hand placement (not to pull up on RW) as well as cues for postiioning of RLE to decrease chance of WB through RLE. Pt difficulty fully maintaining TDWB during gait and transfers and shuffles L foot.  Ambulation/Gait Ambulation/Gait assistance: Min assist Ambulation Distance (Feet): 6 Feet Assistive device: Rolling walker (2 wheeled) Gait Pattern/deviations: Step-to pattern (TDWB on RLE) Gait velocity: decreased   General Gait Details: Encouraged to push through BUE during gait to aid with foot clearance and keep RLE out in front of pt to maintain TDWB status. Minimal foot clearance on LLE during gait trial and fatigues quickly.  Stairs            Wheelchair Mobility    Modified Rankin (Stroke Patients Only)       Balance Overall balance assessment: Needs assistance   Sitting balance-Leahy Scale: Good       Standing balance-Leahy Scale: Poor Standing balance comment: requires use of RW for balance and to maintain WB status on RLE                             Pertinent Vitals/Pain Pain Assessment: No/denies pain    Home Living Family/patient expects to be discharged to:: Private residence Living Arrangements: Spouse/significant other (Pt is caregiver for husband who has Stage 4 CA) Available Help at Discharge: Other (Comment) (Daughter can only assist this week and  then no A available) Type of Home: House Home Access: Stairs to enter Entrance Stairs-Rails: Can reach both Entrance Stairs-Number of Steps: 4-5 Home Layout: Two level;1/2 bath on main level Home Equipment: Walker - 2 wheels;Cane - single point;Crutches Additional Comments: Discussed in detail possiblity of A at home for d/c as pt  was primary caregiver for her sick husband. Pt reports only neighbors could check in intermittently but her daughters live in [place] and [place] my concerns with pt that it is too much for her to take care of herself, the home, and her husband at this time when she is only able to use RLE for balance and limited gait distance. Pt in agreement but hesitant to go to SNF/CIR.     Prior Function Level of Independence: Independent               Hand Dominance        Extremity/Trunk Assessment   Upper Extremity Assessment: Defer to OT evaluation           Lower Extremity Assessment: RLE deficits/detail RLE Deficits / Details: hip and knee strength grossly WFL; ankle in cast and bandage. Decreased muscular endurance    Cervical / Trunk Assessment: Normal  Communication   Communication: No difficulties  Cognition Arousal/Alertness: Awake/alert Behavior During Therapy: WFL for tasks assessed/performed Overall Cognitive Status: Within Functional Limits for tasks assessed                      General Comments      Exercises        Assessment/Plan    PT Assessment Patient needs continued PT services  PT Diagnosis Difficulty walking;Generalized weakness   PT Problem List Decreased strength;Decreased range of motion;Decreased activity tolerance;Decreased balance;Decreased mobility;Decreased knowledge of use of DME;Decreased knowledge of precautions;Pain;Decreased skin integrity  PT Treatment Interventions DME instruction;Gait training;Stair training;Functional mobility training;Therapeutic activities;Therapeutic exercise;Balance training;Neuromuscular re-education;Patient/family education;Wheelchair mobility training   PT Goals (Current goals can be found in the Care Plan section) Acute Rehab PT Goals Patient Stated Goal: get back to her husband PT Goal Formulation: With patient Time For Goal Achievement: 09/09/14 Potential to Achieve Goals: Good     Frequency Min 5X/week   Barriers to discharge Inaccessible home environment;Decreased caregiver support Pt is primary caregiver for her husband who has Stage 4 cancer. Pt also has 4-5 steps to enter home with TDWB status at this time which is difficult for her to maintain. Anticipate she will require A at d/c at least for household management and someone to provide care for her husband as pt will have to adapt for her own needs.    Co-evaluation               End of Session Equipment Utilized During Treatment: Gait belt Activity Tolerance: Patient tolerated treatment well Patient left: in chair;with call bell/phone within reach Nurse Communication: Mobility status;Weight bearing status         Time: 1100-1129 PT Time Calculation (min): 29 min   Charges:   PT Evaluation $Initial PT Evaluation Tier I: 1 Procedure PT Treatments $Gait Training: 8-22 mins $Therapeutic Activity: 8-22 mins   PT G Codes:          Allayne Gitelman 09/02/2014, 12:15 PM

## 2014-09-03 MED ORDER — OXYCODONE HCL 5 MG PO TABS
5.0000 mg | ORAL_TABLET | ORAL | Status: DC | PRN
  Administered 2014-09-03 – 2014-09-04 (×5): 10 mg via ORAL
  Filled 2014-09-03 (×5): qty 2

## 2014-09-03 MED ORDER — TRAMADOL HCL 50 MG PO TABS
50.0000 mg | ORAL_TABLET | Freq: Four times a day (QID) | ORAL | Status: DC | PRN
  Administered 2014-09-03: 50 mg via ORAL
  Filled 2014-09-03: qty 1

## 2014-09-03 NOTE — Consult Note (Signed)
Physical Medicine and Rehabilitation Consult Reason for Consult: Right bimalleolar ankle fracture Referring Physician: [facility]   HPI: Tammie [name] is a 73 y.o. right-handed female with history of hypertension, renal cell carcinoma status post right nephrectomy, COPD with 2 L of oxygen at night and left total knee replacement 2012 in [facility]. Patient was independent prior to admission providing assistance to her disabled husband. Presented 08/31/2014 after mechanical fall without loss of consciousness. X-rays and imaging revealed right displaced oblique distal fibular diametaphyseal fracture as well as displaced transverse fracture of the medial malleolus. Underwent open treatment with internal fixation 09/01/2014 per Dr. Marlana Salvage pain management. Touchdown weightbearing right lower extremity. Physical and occupational therapy evaluations completed 09/02/2014 with recommendations for physical medicine rehabilitation consult.    Review of Systems  Cardiovascular: Positive for palpitations.  Gastrointestinal: Positive for constipation.  Musculoskeletal: Positive for joint pain and myalgias.  Psychiatric/Behavioral: Positive for depression.  All other systems reviewed and are negative.  Past Medical History  Diagnosis Date  . HTN (hypertension)   . Renal cell cancer   . Glaucoma   . Hypothyroidism   . Colon adenoma   . Chronic kidney disease   . Depression    Past Surgical History  Procedure Laterality Date  . Total abdominal hysterectomy    . Kidney surgery    . Replacement total knee    . Joint replacement    . Orif ankle fracture Right 09/01/2014    Procedure: OPEN REDUCTION INTERNAL FIXATION (ORIF) ANKLE FRACTURE;  Surgeon: [name], MD;  Location: [place];  Service: Orthopedics;  Laterality: Right;   Family History  Problem Relation Age of Onset  . Heart disease Father     due to small pox as a [place]  . Lung cancer Paternal  Grandfather     lived to age 77   Social History:  reports that she has never smoked. She does not have any smokeless tobacco history on file. She reports that she does not drink alcohol or use illicit drugs. Allergies:  Allergies  Allergen Reactions  . Levaquin [Levofloxacin] Hives  . Hydrocodone-Acetaminophen Other (See Comments)    DRY MUCOUS MENBRANES  . Oxycodone Other (See Comments)    DRY MUCOUS MEMBRANES  . Other Rash  . Quinolones Rash   Medications Prior to Admission  Medication Sig Dispense Refill  . alprazolam (XANAX) 2 MG tablet Take 2 mg by mouth at bedtime.       . Ascorbic Acid (VITAMIN C) 500 MG CAPS Take 500 mg by mouth daily.       . Calcium Carb-Cholecalciferol (CALCIUM + D3) 600-200 MG-UNIT TABS Take 1 tablet by mouth 2 (two) times daily.       Marland Kitchen FLUoxetine (PROZAC) 20 MG capsule Take 20 mg by mouth every morning.       . Glucosamine-Chondroitin (GLUCOSAMINE CHONDR COMPLEX PO) Take 1 tablet by mouth 2 (two) times daily.       Marland Kitchen latanoprost (XALATAN) 0.005 % ophthalmic solution Place 1 drop into both eyes at bedtime.      Marland Kitchen levothyroxine (SYNTHROID, LEVOTHROID) 75 MCG tablet Take 75 mcg by mouth daily before breakfast.      . losartan (COZAAR) 100 MG tablet Take 100 mg by mouth every morning.       . Multiple Vitamin (MULTI-VITAMINS) TABS Take 1 tablet by mouth daily.       Marland Kitchen omeprazole (PRILOSEC) 20 MG capsule Take by mouth daily.       Marland Kitchen  traZODone (DESYREL) 50 MG tablet Take 1 tablet by mouth at bedtime.         Home: Home Living Family/patient expects to be discharged to:: Private residence Living Arrangements: Spouse/significant other (Pt is caregiver for husband who has stage 4 CA) Available Help at Discharge: Other (Comment) (daughter can only assist this week and then no A available) Type of Home: House Home Access: Stairs to enter [employer] of Steps: 4-5 Entrance Stairs-Rails: Can reach both Home Layout: Two level;1/2 bath on main  level Alternate Level Stairs-Number of Steps: flight Alternate Level Stairs-Rails: Right;Left Home Equipment: Walker - 2 wheels;Cane - single point;Crutches;Bedside commode;Other (comment) (shower hose she uses to wash off bottom) Additional Comments: Discussed in detail possiblity of A at home for d/c as pt was primary caregiver for her sick husband. Pt reports only neighbors could check in intermittently but her daughters live in [place] and [place] my concerns with pt that it is too much for her to take care of herself, the home, and her husband at this time when she is only able to use RLE for balance and limited gait distance. Pt in agreement but hesitant to go to SNF/CIR.   Functional History: Prior Function Level of Independence: Independent Functional Status:  Mobility: Bed Mobility Overal bed mobility: Needs Assistance Bed Mobility: Supine to Sit Supine to sit: Supervision General bed mobility comments: sitting EOB Transfers Overall transfer level: Needs assistance Equipment used: Rolling walker (2 wheeled) Transfers: Sit to/from Stand Sit to Stand: Min assist General transfer comment: cues for hand placement and demonstrated safety concern of this for pt.  Ambulation/Gait Ambulation/Gait assistance: Min assist Ambulation Distance (Feet): 6 Feet Assistive device: Rolling walker (2 wheeled) Gait Pattern/deviations: Step-to pattern (TDWB on RLE) Gait velocity: decreased General Gait Details: Encouraged to push through BUE during gait to aid with foot clearance and keep RLE out in front of pt to maintain TDWB status. Minimal foot clearance on LLE during gait trial and fatigues quickly.    ADL: ADL Overall ADL's : Needs assistance/impaired Grooming: Wash/dry hands;Standing;Moderate assistance (balance and to try to maintain WB status) Upper Body Dressing : Set up;Sitting Lower Body Dressing: Moderate assistance;Sit to/from stand Toilet Transfer: Moderate  assistance;Ambulation;RW;Comfort height toilet;Grab bars Toileting- Clothing Manipulation and Hygiene: Sit to/from stand;Moderate assistance Functional mobility during ADLs: Moderate assistance;Rolling walker General ADL Comments: Pt able to state dressing technique. Ambulated to bathroom (cues and assist to try to help maintain WB status on RLE-difficult to maintain). Performed grooming at sink with assist with balance and to try to help pt maintain WB precautions.  Educated on safety. OT assisted pt with hygiene.  Discussed alternative technique for LB ADLs (leaning).  Cognition: Cognition Overall Cognitive Status: Within Functional Limits for tasks assessed Orientation Level: Oriented X4 Cognition Arousal/Alertness: Awake/alert Behavior During Therapy: WFL for tasks assessed/performed Overall Cognitive Status: Within Functional Limits for tasks assessed  Blood pressure 116/52, pulse 58, temperature 97.8 F (36.6 C), temperature source Oral, resp. rate 16, height 4\' 9"  (1.448 m), weight 84.823 kg (187 lb), SpO2 96.00%. Physical Exam  Constitutional: She is oriented to person, place, and time. She appears well-developed.  HENT:  Head: Normocephalic.  Eyes: EOM are normal.  Neck: Normal range of motion. Neck supple. No thyromegaly present.  Cardiovascular: Normal rate and regular rhythm.   Respiratory: Effort normal and breath sounds normal. No respiratory distress.  GI: Soft. Bowel sounds are normal. She exhibits no distension.  Neurological: She is alert and oriented to person, place,  and time.  Skin:  Cast in place right lower extremity. Neurovascular sensation intact  Psychiatric: She has a normal mood and affect.    No results found for this or any previous visit (from the past 24 hour(s)). Dg Ankle Complete Right  09/01/2014   CLINICAL DATA:  Screw and plate fixation of right ankle fracture.  EXAM: DG C-ARM 61-120 MIN; RIGHT ANKLE - COMPLETE 3+ VIEW  TECHNIQUE: Three views  from portable C-arm radiography were submitted.  CONTRAST:  No contrast.  FLUOROSCOPY TIME:  11 seconds  COMPARISON:  08/31/2014  FINDINGS: Sideplate and screw device fixates the distal fibular fracture. There are 2 screws reducing the medial malleolar fracture. The hardware components and fracture fragments are in anatomic alignment.  IMPRESSION: 1. Status post ORIF of bimalleolar fractures.   Electronically Signed   By: Kerby Moors M.D.   On: 09/01/2014 17:08   Dg C-arm 1-60 Min  09/01/2014   CLINICAL DATA:  Screw and plate fixation of right ankle fracture.  EXAM: DG C-ARM 61-120 MIN; RIGHT ANKLE - COMPLETE 3+ VIEW  TECHNIQUE: Three views from portable C-arm radiography were submitted.  CONTRAST:  No contrast.  FLUOROSCOPY TIME:  11 seconds  COMPARISON:  08/31/2014  FINDINGS: Sideplate and screw device fixates the distal fibular fracture. There are 2 screws reducing the medial malleolar fracture. The hardware components and fracture fragments are in anatomic alignment.  IMPRESSION: 1. Status post ORIF of bimalleolar fractures.   Electronically Signed   By: Kerby Moors M.D.   On: 09/01/2014 17:08    Assessment/Plan: Diagnosis: bimalleolar ankle fx 1. Does the need for close, 24 hr/day medical supervision in concert with the patient's rehab needs make it unreasonable for this patient to be served in a less intensive setting? Yes 2. Co-Morbidities requiring supervision/potential complications: ckd, pain, abla 3. Due to bladder management, bowel management, safety, skin/wound care, disease management, medication administration, pain management and patient education, does the patient require 24 hr/day rehab nursing? Yes 4. Does the patient require coordinated care of a physician, rehab nurse, PT (1-2 hrs/day, 5 days/week) and OT (1-2 hrs/day, 5 days/week) to address physical and functional deficits in the context of the above medical diagnosis(es)? Yes Addressing deficits in the following areas:  balance, endurance, locomotion, strength, transferring, bowel/bladder control, bathing, dressing, feeding, grooming and psychosocial support 5. Can the patient actively participate in an intensive therapy program of at least 3 hrs of therapy per day at least 5 days per week? Yes 6. The potential for patient to make measurable gains while on inpatient rehab is excellent 7. Anticipated functional outcomes upon discharge from inpatient rehab are modified independent  with PT, modified independent with OT, n/a with SLP. 8. Estimated rehab length of stay to reach the above functional goals is: 7-10 days 9. Does the patient have adequate social supports to accommodate these discharge functional goals? Potentially 10. Anticipated D/C setting: Home 11. Anticipated post D/C treatments: [place] therapy 12. Overall Rehab/Functional Prognosis: excellent  RECOMMENDATIONS: This patient's condition is appropriate for continued rehabilitative care in the following setting: CIR Patient has agreed to participate in recommended program. Yes Note that insurance prior authorization may be required for reimbursement for recommended care.  Comment: Rehab Admissions Coordinator to follow up.  Thanks,  Meredith Staggers, MD, Mellody Drown     09/03/2014

## 2014-09-03 NOTE — Progress Notes (Signed)
Physical Therapy Treatment Patient Details Name: Tammie [name] MRN: [id number] DOB: 1940-12-22 Today's Date: 09/03/2014    History of Present Illness      PT Comments    Mobility limited by pain today.  Pt fatigues quickly and demo difficulty with maintaining TDWB RLE.  PT to continue per POC.  Follow Up Recommendations  CIR;Supervision/Assistance - 24 hour     Equipment Recommendations  Wheelchair (measurements PT);Wheelchair cushion (measurements PT)    Recommendations for Other Services       Precautions / Restrictions Precautions Precautions: Fall Required Braces or Orthoses: Other Brace/Splint Other Brace/Splint: CAM boot in room for use once cast removed. (Pt was using prior to ORIF) Restrictions Weight Bearing Restrictions: Yes RLE Weight Bearing: Touchdown weight bearing    Mobility  Bed Mobility         Supine to sit: Supervision;HOB elevated     General bed mobility comments: use of bed rail  Transfers   Equipment used: Rolling walker (2 wheeled)   Sit to Stand: Min assist         General transfer comment: verbal cues for hand placement  Ambulation/Gait Ambulation/Gait assistance: Min assist Ambulation Distance (Feet): 3 Feet Assistive device: Rolling walker (2 wheeled) Gait Pattern/deviations: Step-to pattern Gait velocity: decreased       Stairs            Wheelchair Mobility    Modified Rankin (Stroke Patients Only)       Balance                                    Cognition Arousal/Alertness: Awake/alert Behavior During Therapy: WFL for tasks assessed/performed Overall Cognitive Status: Within Functional Limits for tasks assessed                      Exercises      General Comments        Pertinent Vitals/Pain Pain Assessment: 0-10 Pain Score: 5  Pain Location: RLE Pain Intervention(s): Repositioned;Limited activity within patient's tolerance    Home Living                       Prior Function            PT Goals (current goals can now be found in the care plan section) Progress towards PT goals: Progressing toward goals    Frequency  Min 5X/week    PT Plan Current plan remains appropriate    Co-evaluation             End of Session Equipment Utilized During Treatment: Gait belt Activity Tolerance: Patient limited by pain Patient left: in chair;with call bell/phone within reach     Time: 0859-0912 PT Time Calculation (min): 13 min  Charges:  $Gait Training: 8-22 mins                    G Codes:      Lorriane Shire 09/03/2014, 10:04 AM

## 2014-09-03 NOTE — Progress Notes (Signed)
I will follow up with pt and family tomorrow. Inpt rehab admission pending bed availability which is limited. [phone]

## 2014-09-03 NOTE — Progress Notes (Signed)
PROGRESS NOTE  ALIZZON DIOGUARDI QIW:[id number] DOB: Apr 23, 1941 DOA: 08/31/2014 PCP: [name], MD  HPI: 73 y/o female with PMH fo HTN, RCC (s/p R nephrectomy), Hypothyroidism, Chronic respiratory failure (2L at night), presents after a mechanical fall at her home and found to have displaced right distal fibular fracture and displaced fracture of the medial malleolus with subtle medial subluxation of the distal tibia.   Subjective/ 24 H Interval events - doing well this morning, complains of pain R ankle  Assessment/Plan: Right ankle fracture  - 10/19 s/p ORIF;per ortho  - CIR evaluating CKD stage II h/o R nephrectomy due to [place]; d/c ketorolac; cont monitoring  Chronic respiratory failure, no respiratory distress on exam  - The patient is maintained on 2 L nasal cannula at nighttime  Hypothyroidism Continue Synthroid  HTN resume losartan/ARB post op; cont monitor  Depression/anxiety - Continue Prozac and Xanax and trazodone   Diet: low sodium Fluids: none  DVT Prophylaxis: Aspirin 325 BID  Code Status: Full Family Communication: d/w daughter bedside  Disposition Plan: CIR when bed available / approval  Consultants:  Orthopedic surgery   Procedures:  R ankle ORIF 10/19   Antibiotics  Anti-infectives   Start     Dose/Rate Route Frequency Ordered Stop   09/01/14 2200  ceFAZolin (ANCEF) IVPB 2 g/50 mL premix     2 g 100 mL/hr over 30 Minutes Intravenous Every 6 hours 09/01/14 1846 09/02/14 1024     Studies  Filed Vitals:   09/02/14 1314 09/02/14 1936 09/03/14 0556 09/03/14 1445  BP: 104/45 133/61 116/52 130/52  Pulse: 75 78 58 66  Temp: 98.8 F (37.1 C) 99.7 F (37.6 C) 97.8 F (36.6 C) 98.3 F (36.8 C)  TempSrc:  Oral  Oral  Resp: 16 17 16 17   Height:      Weight:      SpO2: 95% 92% 96% 92%    Intake/Output Summary (Last 24 hours) at 09/03/14 1706 Last data filed at 09/03/14 1300  Gross per 24 hour  Intake    600 ml  Output      0 ml    Net    600 ml   Filed Weights   08/31/14 2100 08/31/14 2325  Weight: 81.647 kg (180 lb) 84.823 kg (187 lb)    Exam:  General:  NAD  Cardiovascular: RRR  Respiratory: CTA biL  MSK: no edema, Right ankle wrapped  Neuro: non focal  Data Reviewed: Basic Metabolic Panel:  Recent Labs Lab 08/31/14 1038  NA 139  K 4.4  CL 100  CO2 27  GLUCOSE 107*  BUN 18  CREATININE 0.99  CALCIUM 9.8   Liver Function Tests:  Recent Labs Lab 08/31/14 1038  AST 34  ALT 38*  ALKPHOS 92  BILITOT 0.3  PROT 7.7  ALBUMIN 3.7   CBC:  Recent Labs Lab 08/31/14 1038  WBC 8.6  NEUTROABS 5.7  HGB 14.4  HCT 44.9  MCV 95.1  PLT 266   Recent Results (from the past 240 hour(s))  SURGICAL PCR SCREEN     Status: None   Collection Time    08/31/14  9:50 PM      Result Value Ref Range Status   MRSA, PCR NEGATIVE  NEGATIVE Final   Staphylococcus aureus NEGATIVE  NEGATIVE Final   Comment:            The Xpert SA Assay (FDA     approved for NASAL specimens     in patients  over 35 years of age),     is one component of     a comprehensive surveillance     program.  Test performance has     been validated by [employer] for patients greater     than or equal to 32 year old.     It is not intended     to diagnose infection nor to     guide or monitor treatment.     Studies: No results found.  No results found.  Scheduled Meds: . alprazolam  2 mg Oral QHS  . aspirin EC  325 mg Oral BID  . calcium-vitamin D  1 tablet Oral BID  . docusate sodium  100 mg Oral BID  . FLUoxetine  20 mg Oral Daily  . latanoprost  1 drop Both Eyes QHS  . levothyroxine  75 mcg Oral QAC breakfast  . losartan  100 mg Oral Daily  . multivitamin with minerals  1 tablet Oral Daily  . pantoprazole  40 mg Oral Daily  . senna  1 tablet Oral BID  . traZODone  50 mg Oral QHS  . vitamin C  500 mg Oral Daily   Continuous Infusions:   Active Problems:   Ankle fracture, bimalleolar, closed   CKD  (chronic kidney disease) stage 2, GFR 60-89 ml/min   Uncontrolled pain   Hypothyroidism   Time spent: [place], MD [facility] Pager [phone]. If 7 PM - 7 AM, please contact night-coverage at [email or web address], password [facility] 09/03/2014, 5:06 PM  LOS: 3 days

## 2014-09-04 MED ORDER — OXYCODONE HCL 5 MG PO TABS: 5.0000 mg | ORAL_TABLET | ORAL | Status: DC | PRN

## 2014-09-04 MED ORDER — METHOCARBAMOL 500 MG PO TABS
500.0000 mg | ORAL_TABLET | Freq: Four times a day (QID) | ORAL | Status: DC | PRN
Start: 2014-09-04 — End: 2015-04-08

## 2014-09-04 MED ORDER — ASPIRIN 325 MG PO TBEC: 325.0000 mg | DELAYED_RELEASE_TABLET | Freq: Two times a day (BID) | ORAL | Status: DC

## 2014-09-04 MED ORDER — POLYETHYLENE GLYCOL 3350 17 G PO PACK: 17.0000 g | PACK | Freq: Every day | ORAL | Status: DC | PRN

## 2014-09-04 MED ORDER — SENNA 8.6 MG PO TABS: 1.0000 | ORAL_TABLET | Freq: Two times a day (BID) | ORAL | Status: DC

## 2014-09-04 NOTE — Progress Notes (Signed)
Physical Therapy Treatment Patient Details Name: JOYLYNN DEFRANCESCO MRN: [id number] DOB: Mar 22, 1941 Today's Date: 09/04/2014    History of Present Illness      PT Comments    No beds available today in CIR so pt prefers d/c home with HHPT and 24-hour assist from family.  Pt has RW, crutches and 3n1 at home.  She will need a w/c and w/c cushion.  D/C plan updated.  Follow Up Recommendations  Home health PT;Supervision/Assistance - 24 hour     Equipment Recommendations  Wheelchair (measurements PT);Wheelchair cushion (measurements PT)    Recommendations for Other Services       Precautions / Restrictions Precautions Precautions: Fall Required Braces or Orthoses: Other Brace/Splint Other Brace/Splint: CAM boot in room for use once cast removed. (Pt was using prior to ORIF) Restrictions Weight Bearing Restrictions: Yes RLE Weight Bearing: Touchdown weight bearing    Mobility  Bed Mobility   Bed Mobility: Sit to Supine     Supine to sit: Supervision Sit to supine: Supervision      Transfers   Equipment used: Rolling walker (2 wheeled) Transfers: Stand Pivot Transfers Sit to Stand: Min guard Stand pivot transfers: Min guard       General transfer comment: verbal cues for sequencing and safety  Ambulation/Gait Ambulation/Gait assistance: Min guard Ambulation Distance (Feet): 5 Feet Assistive device: Rolling walker (2 wheeled) Gait Pattern/deviations: Step-to pattern Gait velocity: decreased   General Gait Details: Per pt request, crutches attempted for gait and stair.  Pt was unable to progress LLE due to balance deficits.   Stairs Stairs: Yes       General stair comments: Attempted stairs with 2 rails, 2 crutches, and then a 3rd attempt with 1 crutch and 1 rail.  Pt unable to stabilize with PT assist to step up with LLE.  Pt advised to sit down and bump up steps on her bottom. At top of steps, pt's daughter will assist her into the w/c.  Pt verbalizes  understanding and expresses no questions or concerns.  Wheelchair Mobility    Modified Rankin (Stroke Patients Only)       Balance                                    Cognition Arousal/Alertness: Awake/alert Behavior During Therapy: WFL for tasks assessed/performed Overall Cognitive Status: Within Functional Limits for tasks assessed                      Exercises      General Comments        Pertinent Vitals/Pain Pain Score: 4  Pain Location: RLE Pain Intervention(s): Monitored during session    Home Living                      Prior Function            PT Goals (current goals can now be found in the care plan section) Progress towards PT goals: Progressing toward goals    Frequency  Min 5X/week    PT Plan Discharge plan needs to be updated    Co-evaluation             End of Session Equipment Utilized During Treatment: Gait belt Activity Tolerance: Patient limited by fatigue Patient left: in bed;with call bell/phone within reach     Time: 7353-2992 PT Time Calculation (min): 34 min  Charges:  $Gait Training: 8-22 mins $Therapeutic Activity: 8-22 mins                    G Codes:      Lorriane Shire 09/04/2014, 10:40 AM

## 2014-09-04 NOTE — Care Management Note (Signed)
CARE MANAGEMENT NOTE 09/04/2014  Patient:  Tammie [name], Tammie [name]   Account Number:  [id number]  Date Initiated:  09/02/2014  Documentation initiated by:  Ricki Miller  Subjective/Objective Assessment:   73 yr old female admitted with [name] right bimallor ankle fracture,10/19 15  patient under went [name] right ankle ORIF.     Action/Plan:   PT/OT to eval.  Case manager will continue to monitor.   Anticipated DC Date:  09/04/2014   Anticipated DC Plan:  [place]  In-house referral  Clinical Social Worker      DC Planning Services  CM consult      [facility] Choice  HOME HEALTH  DURABLE MEDICAL EQUIPMENT   Choice offered to / List presented to:  C-1 Patient   DME arranged  [place]      DME agency  [place] arranged  [place]      [place]   Status of service:  Completed, signed off Medicare Important Message given?  YES (If response is "NO", the following Medicare IM given date fields will be blank) Date Medicare IM given:  09/04/2014 Medicare IM given by:  Ricki Miller Date Additional Medicare IM given:   Additional Medicare IM given by:    Discharge Disposition:  [place]  Per UR Regulation:  Reviewed for med. necessity/level of care/duration of stay  If discussed at [place] of Stay Meetings, dates discussed:    Comments:  09/04/14 Ricki Miller, RN BSN Case Manager Case manager spoke with patient concerning home health and DME needs. Patient states no need to speak with her daughter, she is in her right mind and makes her own decisions. CM offered choice for home health, referral called to Christa See, [facility] Liaison. CM discussed with patient needs for home, and how she would be able to manage caring for herself and her ailing husband. Patient states she will be ok. Because of patient's request case manager will not contact daughter, patient says she  will let her know that she is discharged.

## 2014-09-04 NOTE — Discharge Summary (Signed)
Physician Discharge Summary  Tammie [name] BMW:[id number] DOB: April 15, 1941 DOA: 08/31/2014  PCP: Tammie Naas, MD  Admit date: 08/31/2014 Discharge date: 09/04/2014  Time spent: 35 minutes  Recommendations for Outpatient Follow-up:  1. Follow up with PCP in 1-2 weeks 2. Follow up with Orthopedic surgery in 1-2 weeks  Discharge Diagnoses:  Active Problems:   Ankle fracture, bimalleolar, closed   CKD (chronic kidney disease) stage 2, GFR 60-89 ml/min   Uncontrolled pain   Hypothyroidism  Discharge Condition: stable  Diet recommendation: regular   Filed Weights   08/31/14 2100 08/31/14 2325  Weight: 81.647 kg (180 lb) 84.823 kg (187 lb)   History of present illness:  73 year old female with a history of hypertension, renal cell carcinoma, hypothyroidism, hypertension, and chronic respiratory failure (2L at night), presents after a mechanical fall at her home. The patient tripped over her own feet when her clogs were malpositioned. She had immediate right ankle pain and have difficulty getting up. EMS was activated. The patient was brought to the emergency department where x-ray of the right ankle revealed a displaced right distal fibular fracture and displaced fracture of the medial malleolus with subtle medial subluxation of the distal tibia. The patient still has significant pain and is unable to walk. As result admission was requested. CBC, hepatic enzymes, and BMP were unremarkable. The patient was hemodynamically stable and afebrile.  Hospital Course:  Right ankle fracture - orthopedic surgery was consulted and patient underwent ORIF on 10/19. She did well after the surgery, PT evaluated patient and recommended SNF or CIR. Inpatient rehab evaluated patient however could not accept patient at this time and SNF was again recommended. Patient and her family declined rehab or SNF and chose instead to go home. HHPT was ordered and arranged prior to discharge. She is to be  on Aspirin 325 BID for prophylaxis per ortho and will follow up with prthopedic surgery as an outpatient.  CKD stage II h/o R nephrectomy due to [place]; stable renal function.  Chronic respiratory failure, no respiratory distress on exam, she has chronic 2L [place] at nighttime.  Hypothyroidism Continue Synthroid  HTN resume losartan/ARB post op; cont monitor  Depression/anxiety - Continue Prozac and Xanax and trazodone   Procedures:  ORIF 10/19   Consultations:  Orthopedic surgery   Discharge Exam: Filed Vitals:   09/03/14 0556 09/03/14 1445 09/03/14 2019 09/04/14 0553  BP: 116/52 130/52 128/54 128/60  Pulse: 58 66 72 66  Temp: 97.8 F (36.6 C) 98.3 F (36.8 C) 98.5 F (36.9 C) 98.3 F (36.8 C)  TempSrc:  Oral    Resp: 16 17 16 16   Height:      Weight:      SpO2: 96% 92% 94% 92%   General: NAD Cardiovascular: RRR Respiratory: CTA biL  Discharge Instructions     Medication List         alprazolam 2 MG tablet  Commonly known as:  XANAX  Take 2 mg by mouth at bedtime.     aspirin 325 MG EC tablet  Take 1 tablet (325 mg total) by mouth 2 (two) times daily.     Calcium + D3 600-200 MG-UNIT Tabs  Take 1 tablet by mouth 2 (two) times daily.     FLUoxetine 20 MG capsule  Commonly known as:  PROZAC  Take 20 mg by mouth every morning.     GLUCOSAMINE CHONDR COMPLEX PO  Take 1 tablet by mouth 2 (two) times daily.     latanoprost  0.005 % ophthalmic solution  Commonly known as:  XALATAN  Place 1 drop into both eyes at bedtime.     levothyroxine 75 MCG tablet  Commonly known as:  SYNTHROID, LEVOTHROID  Take 75 mcg by mouth daily before breakfast.     losartan 100 MG tablet  Commonly known as:  COZAAR  Take 100 mg by mouth every morning.     methocarbamol 500 MG tablet  Commonly known as:  ROBAXIN  Take 1 tablet (500 mg total) by mouth every 6 (six) hours as needed for muscle spasms.     MULTI-VITAMINS Tabs  Take 1 tablet by mouth daily.     omeprazole 20 MG  capsule  Commonly known as:  PRILOSEC  Take by mouth daily.     oxyCODONE 5 MG immediate release tablet  Commonly known as:  Oxy IR/ROXICODONE  Take 1-2 tablets (5-10 mg total) by mouth every 3 (three) hours as needed for moderate pain or severe pain.     polyethylene glycol packet  Commonly known as:  MIRALAX / GLYCOLAX  Take 17 g by mouth daily as needed for mild constipation.     senna 8.6 MG Tabs tablet  Commonly known as:  SENOKOT  Take 1 tablet (8.6 mg total) by mouth 2 (two) times daily.     traZODone 50 MG tablet  Commonly known as:  DESYREL  Take 1 tablet by mouth at bedtime.     Vitamin C 500 MG Caps  Take 500 mg by mouth daily.       Follow-up Information   Follow up with Tammie Naas, MD. Schedule an appointment as soon as possible for a visit in 2 weeks.   Specialty:  Family Medicine   Contact information:   [address], [place] [address] [phone]       Follow up with [name], MD. Schedule an appointment as soon as possible for a visit in 2 weeks.   Specialty:  Orthopedic Surgery   Contact information:   [place] [address] [phone]       The results of significant diagnostics from this hospitalization (including imaging, microbiology, ancillary and laboratory) are listed below for reference.    Significant Diagnostic Studies: Dg Ankle Complete Right  09/01/2014   CLINICAL DATA:  Screw and plate fixation of right ankle fracture.  EXAM: DG C-ARM 61-120 MIN; RIGHT ANKLE - COMPLETE 3+ VIEW  TECHNIQUE: Three views from portable C-arm radiography were submitted.  CONTRAST:  No contrast.  FLUOROSCOPY TIME:  11 seconds  COMPARISON:  08/31/2014  FINDINGS: Sideplate and screw device fixates the distal fibular fracture. There are 2 screws reducing the medial malleolar fracture. The hardware components and fracture fragments are in anatomic alignment.  IMPRESSION: 1. Status post ORIF of  bimalleolar fractures.   Electronically Signed   By: Kerby Moors M.D.   On: 09/01/2014 17:08   Dg Ankle Complete Right  08/31/2014   CLINICAL DATA:  Tripped and fell with right ankle pain.  EXAM: RIGHT ANKLE - COMPLETE 3+ VIEW  COMPARISON:  None.  FINDINGS: There is diffuse soft tissue swelling over the ankle. There is a displaced oblique fracture of the distal fibular diametaphyseal region above the ankle mortise. There is a displaced transverse fracture of the medial malleolus. Subtle medial subluxation of the tibia on the talus. Nonspecific soft tissue calcifications posterior to the distal tibia. Bone island over the posterior calcaneus. Mild degenerate changes over the midfoot.  IMPRESSION: Displaced oblique distal fibular diametaphyseal fracture as well as displaced transverse fracture of the medial malleolus. Subtle medial subluxation of the distal tibia on the talus.   Electronically Signed   By: Marin Olp M.D.   On: 08/31/2014 10:05   Dg C-arm 1-60 Min  09/01/2014   CLINICAL DATA:  Screw and plate fixation of right ankle fracture.  EXAM: DG C-ARM 61-120 MIN; RIGHT ANKLE - COMPLETE 3+ VIEW  TECHNIQUE: Three views from portable C-arm radiography were submitted.  CONTRAST:  No contrast.  FLUOROSCOPY TIME:  11 seconds  COMPARISON:  08/31/2014  FINDINGS: Sideplate and screw device fixates the distal fibular fracture. There are 2 screws reducing the medial malleolar fracture. The hardware components and fracture fragments are in anatomic alignment.  IMPRESSION: 1. Status post ORIF of bimalleolar fractures.   Electronically Signed   By: Kerby Moors M.D.   On: 09/01/2014 17:08    Microbiology: Recent Results (from the past 240 hour(s))  SURGICAL PCR SCREEN     Status: None   Collection Time    08/31/14  9:50 PM      Result Value Ref Range Status   MRSA, PCR NEGATIVE  NEGATIVE Final   Staphylococcus aureus NEGATIVE  NEGATIVE Final   Comment:            The Xpert SA Assay (FDA      approved for NASAL specimens     in patients over 74 years of age),     is one component of     a comprehensive surveillance     program.  Test performance has     been validated by [employer] for patients greater     than or equal to 40 year old.     It is not intended     to diagnose infection nor to     guide or monitor treatment.    Labs: Basic Metabolic Panel:  Recent Labs Lab 08/31/14 1038  NA 139  K 4.4  CL 100  CO2 27  GLUCOSE 107*  BUN 18  CREATININE 0.99  CALCIUM 9.8   Liver Function Tests:  Recent Labs Lab 08/31/14 1038  AST 34  ALT 38*  ALKPHOS 92  BILITOT 0.3  PROT 7.7  ALBUMIN 3.7   CBC:  Recent Labs Lab 08/31/14 1038  WBC 8.6  NEUTROABS 5.7  HGB 14.4  HCT 44.9  MCV 95.1  PLT 266   Signed:  GHERGHE, COSTIN  [facility] 09/04/2014, 6:04 PM

## 2014-09-04 NOTE — Progress Notes (Signed)
Patient is stable for discharge to CIR from ortho standpoint.  Follow up in office in 2 weeks.  Azucena Cecil, MD [place] 8:08 AM

## 2014-09-04 NOTE — Progress Notes (Signed)
Inpt acute rehab bed is not available for pt today. Beds limited. I have discussed with RN CM. [phone]

## 2014-09-04 NOTE — Discharge Instructions (Signed)

## 2014-09-05 DIAGNOSIS — I129 Hypertensive chronic kidney disease with stage 1 through stage 4 chronic kidney disease, or unspecified chronic kidney disease: Secondary | ICD-10-CM | POA: Diagnosis not present

## 2014-09-05 DIAGNOSIS — H409 Unspecified glaucoma: Secondary | ICD-10-CM | POA: Diagnosis not present

## 2014-09-05 DIAGNOSIS — S89391D Other physeal fracture of lower end of right fibula, subsequent encounter for fracture with routine healing: Secondary | ICD-10-CM | POA: Diagnosis not present

## 2014-09-05 DIAGNOSIS — S8251XD Displaced fracture of medial malleolus of right tibia, subsequent encounter for closed fracture with routine healing: Secondary | ICD-10-CM | POA: Diagnosis not present

## 2014-09-05 DIAGNOSIS — N182 Chronic kidney disease, stage 2 (mild): Secondary | ICD-10-CM | POA: Diagnosis not present

## 2014-09-05 DIAGNOSIS — J9610 Chronic respiratory failure, unspecified whether with hypoxia or hypercapnia: Secondary | ICD-10-CM | POA: Diagnosis not present

## 2014-09-09 DIAGNOSIS — F329 Major depressive disorder, single episode, unspecified: Secondary | ICD-10-CM | POA: Diagnosis not present

## 2014-09-09 DIAGNOSIS — K219 Gastro-esophageal reflux disease without esophagitis: Secondary | ICD-10-CM | POA: Diagnosis not present

## 2014-09-09 DIAGNOSIS — S82891D Other fracture of right lower leg, subsequent encounter for closed fracture with routine healing: Secondary | ICD-10-CM | POA: Diagnosis not present

## 2014-09-09 DIAGNOSIS — N183 Chronic kidney disease, stage 3 (moderate): Secondary | ICD-10-CM | POA: Diagnosis not present

## 2014-09-09 DIAGNOSIS — E039 Hypothyroidism, unspecified: Secondary | ICD-10-CM | POA: Diagnosis not present

## 2014-09-09 DIAGNOSIS — I10 Essential (primary) hypertension: Secondary | ICD-10-CM | POA: Diagnosis not present

## 2014-09-09 DIAGNOSIS — J40 Bronchitis, not specified as acute or chronic: Secondary | ICD-10-CM | POA: Diagnosis not present

## 2014-09-09 DIAGNOSIS — R58 Hemorrhage, not elsewhere classified: Secondary | ICD-10-CM | POA: Diagnosis not present

## 2014-09-18 DIAGNOSIS — S82841A Displaced bimalleolar fracture of right lower leg, initial encounter for closed fracture: Secondary | ICD-10-CM | POA: Diagnosis not present

## 2014-10-13 DIAGNOSIS — M25562 Pain in left knee: Secondary | ICD-10-CM | POA: Diagnosis not present

## 2014-10-13 DIAGNOSIS — M79604 Pain in right leg: Secondary | ICD-10-CM | POA: Diagnosis not present

## 2014-10-13 DIAGNOSIS — S82841D Displaced bimalleolar fracture of right lower leg, subsequent encounter for closed fracture with routine healing: Secondary | ICD-10-CM | POA: Diagnosis not present

## 2014-10-21 DIAGNOSIS — H4040X0 Glaucoma secondary to eye inflammation, unspecified eye, stage unspecified: Secondary | ICD-10-CM | POA: Insufficient documentation

## 2014-10-21 DIAGNOSIS — H401490 Capsular glaucoma with pseudoexfoliation of lens, unspecified eye, stage unspecified: Secondary | ICD-10-CM | POA: Diagnosis not present

## 2014-10-28 DIAGNOSIS — S82841D Displaced bimalleolar fracture of right lower leg, subsequent encounter for closed fracture with routine healing: Secondary | ICD-10-CM | POA: Diagnosis not present

## 2014-11-04 DIAGNOSIS — S8251XD Displaced fracture of medial malleolus of right tibia, subsequent encounter for closed fracture with routine healing: Secondary | ICD-10-CM | POA: Diagnosis not present

## 2014-11-04 DIAGNOSIS — S89391D Other physeal fracture of lower end of right fibula, subsequent encounter for fracture with routine healing: Secondary | ICD-10-CM | POA: Diagnosis not present

## 2014-11-04 DIAGNOSIS — N182 Chronic kidney disease, stage 2 (mild): Secondary | ICD-10-CM | POA: Diagnosis not present

## 2014-11-04 DIAGNOSIS — J449 Chronic obstructive pulmonary disease, unspecified: Secondary | ICD-10-CM | POA: Diagnosis not present

## 2014-11-04 DIAGNOSIS — J9610 Chronic respiratory failure, unspecified whether with hypoxia or hypercapnia: Secondary | ICD-10-CM | POA: Diagnosis not present

## 2014-11-04 DIAGNOSIS — I129 Hypertensive chronic kidney disease with stage 1 through stage 4 chronic kidney disease, or unspecified chronic kidney disease: Secondary | ICD-10-CM | POA: Diagnosis not present

## 2014-11-05 DIAGNOSIS — N182 Chronic kidney disease, stage 2 (mild): Secondary | ICD-10-CM | POA: Diagnosis not present

## 2014-11-05 DIAGNOSIS — S89391D Other physeal fracture of lower end of right fibula, subsequent encounter for fracture with routine healing: Secondary | ICD-10-CM | POA: Diagnosis not present

## 2014-11-05 DIAGNOSIS — J449 Chronic obstructive pulmonary disease, unspecified: Secondary | ICD-10-CM | POA: Diagnosis not present

## 2014-11-05 DIAGNOSIS — S8251XD Displaced fracture of medial malleolus of right tibia, subsequent encounter for closed fracture with routine healing: Secondary | ICD-10-CM | POA: Diagnosis not present

## 2014-11-05 DIAGNOSIS — J9610 Chronic respiratory failure, unspecified whether with hypoxia or hypercapnia: Secondary | ICD-10-CM | POA: Diagnosis not present

## 2014-11-05 DIAGNOSIS — I129 Hypertensive chronic kidney disease with stage 1 through stage 4 chronic kidney disease, or unspecified chronic kidney disease: Secondary | ICD-10-CM | POA: Diagnosis not present

## 2014-11-05 NOTE — ED Notes (Signed)
Pt called today, reported she came to [facility] ED on 10/18 and had a back brace on. No documentation in chart about a back brace. Pt was admitted for broken ankle, transferred to 6th floor and then later transferred to [facility] for surgery. Charged asked why this was the first time pt was calling about brace because it has been over 2 months. Pt reported she did not need brace before this and that "she has been sedated for 2 months". Charge got pts number and left note for management.

## 2014-11-06 DIAGNOSIS — I129 Hypertensive chronic kidney disease with stage 1 through stage 4 chronic kidney disease, or unspecified chronic kidney disease: Secondary | ICD-10-CM | POA: Diagnosis not present

## 2014-11-06 DIAGNOSIS — S89391D Other physeal fracture of lower end of right fibula, subsequent encounter for fracture with routine healing: Secondary | ICD-10-CM | POA: Diagnosis not present

## 2014-11-06 DIAGNOSIS — S8251XD Displaced fracture of medial malleolus of right tibia, subsequent encounter for closed fracture with routine healing: Secondary | ICD-10-CM | POA: Diagnosis not present

## 2014-11-06 DIAGNOSIS — N182 Chronic kidney disease, stage 2 (mild): Secondary | ICD-10-CM | POA: Diagnosis not present

## 2014-11-06 DIAGNOSIS — J449 Chronic obstructive pulmonary disease, unspecified: Secondary | ICD-10-CM | POA: Diagnosis not present

## 2014-11-06 DIAGNOSIS — J9610 Chronic respiratory failure, unspecified whether with hypoxia or hypercapnia: Secondary | ICD-10-CM | POA: Diagnosis not present

## 2014-11-10 DIAGNOSIS — S8251XD Displaced fracture of medial malleolus of right tibia, subsequent encounter for closed fracture with routine healing: Secondary | ICD-10-CM | POA: Diagnosis not present

## 2014-11-10 DIAGNOSIS — J9610 Chronic respiratory failure, unspecified whether with hypoxia or hypercapnia: Secondary | ICD-10-CM | POA: Diagnosis not present

## 2014-11-10 DIAGNOSIS — I129 Hypertensive chronic kidney disease with stage 1 through stage 4 chronic kidney disease, or unspecified chronic kidney disease: Secondary | ICD-10-CM | POA: Diagnosis not present

## 2014-11-10 DIAGNOSIS — N182 Chronic kidney disease, stage 2 (mild): Secondary | ICD-10-CM | POA: Diagnosis not present

## 2014-11-10 DIAGNOSIS — J449 Chronic obstructive pulmonary disease, unspecified: Secondary | ICD-10-CM | POA: Diagnosis not present

## 2014-11-10 DIAGNOSIS — S89391D Other physeal fracture of lower end of right fibula, subsequent encounter for fracture with routine healing: Secondary | ICD-10-CM | POA: Diagnosis not present

## 2014-11-12 DIAGNOSIS — J449 Chronic obstructive pulmonary disease, unspecified: Secondary | ICD-10-CM | POA: Diagnosis not present

## 2014-11-12 DIAGNOSIS — I129 Hypertensive chronic kidney disease with stage 1 through stage 4 chronic kidney disease, or unspecified chronic kidney disease: Secondary | ICD-10-CM | POA: Diagnosis not present

## 2014-11-12 DIAGNOSIS — S8251XD Displaced fracture of medial malleolus of right tibia, subsequent encounter for closed fracture with routine healing: Secondary | ICD-10-CM | POA: Diagnosis not present

## 2014-11-12 DIAGNOSIS — J9610 Chronic respiratory failure, unspecified whether with hypoxia or hypercapnia: Secondary | ICD-10-CM | POA: Diagnosis not present

## 2014-11-12 DIAGNOSIS — S89391D Other physeal fracture of lower end of right fibula, subsequent encounter for fracture with routine healing: Secondary | ICD-10-CM | POA: Diagnosis not present

## 2014-11-12 DIAGNOSIS — N182 Chronic kidney disease, stage 2 (mild): Secondary | ICD-10-CM | POA: Diagnosis not present

## 2014-11-13 DIAGNOSIS — J449 Chronic obstructive pulmonary disease, unspecified: Secondary | ICD-10-CM | POA: Diagnosis not present

## 2014-11-13 DIAGNOSIS — J9610 Chronic respiratory failure, unspecified whether with hypoxia or hypercapnia: Secondary | ICD-10-CM | POA: Diagnosis not present

## 2014-11-13 DIAGNOSIS — S89391D Other physeal fracture of lower end of right fibula, subsequent encounter for fracture with routine healing: Secondary | ICD-10-CM | POA: Diagnosis not present

## 2014-11-13 DIAGNOSIS — N182 Chronic kidney disease, stage 2 (mild): Secondary | ICD-10-CM | POA: Diagnosis not present

## 2014-11-13 DIAGNOSIS — I129 Hypertensive chronic kidney disease with stage 1 through stage 4 chronic kidney disease, or unspecified chronic kidney disease: Secondary | ICD-10-CM | POA: Diagnosis not present

## 2014-11-13 DIAGNOSIS — S8251XD Displaced fracture of medial malleolus of right tibia, subsequent encounter for closed fracture with routine healing: Secondary | ICD-10-CM | POA: Diagnosis not present

## 2014-11-18 DIAGNOSIS — I129 Hypertensive chronic kidney disease with stage 1 through stage 4 chronic kidney disease, or unspecified chronic kidney disease: Secondary | ICD-10-CM | POA: Diagnosis not present

## 2014-11-18 DIAGNOSIS — S8251XD Displaced fracture of medial malleolus of right tibia, subsequent encounter for closed fracture with routine healing: Secondary | ICD-10-CM | POA: Diagnosis not present

## 2014-11-18 DIAGNOSIS — J449 Chronic obstructive pulmonary disease, unspecified: Secondary | ICD-10-CM | POA: Diagnosis not present

## 2014-11-18 DIAGNOSIS — J9610 Chronic respiratory failure, unspecified whether with hypoxia or hypercapnia: Secondary | ICD-10-CM | POA: Diagnosis not present

## 2014-11-18 DIAGNOSIS — N182 Chronic kidney disease, stage 2 (mild): Secondary | ICD-10-CM | POA: Diagnosis not present

## 2014-11-18 DIAGNOSIS — S89391D Other physeal fracture of lower end of right fibula, subsequent encounter for fracture with routine healing: Secondary | ICD-10-CM | POA: Diagnosis not present

## 2014-11-20 DIAGNOSIS — S89391D Other physeal fracture of lower end of right fibula, subsequent encounter for fracture with routine healing: Secondary | ICD-10-CM | POA: Diagnosis not present

## 2014-11-20 DIAGNOSIS — I129 Hypertensive chronic kidney disease with stage 1 through stage 4 chronic kidney disease, or unspecified chronic kidney disease: Secondary | ICD-10-CM | POA: Diagnosis not present

## 2014-11-20 DIAGNOSIS — J9610 Chronic respiratory failure, unspecified whether with hypoxia or hypercapnia: Secondary | ICD-10-CM | POA: Diagnosis not present

## 2014-11-20 DIAGNOSIS — N182 Chronic kidney disease, stage 2 (mild): Secondary | ICD-10-CM | POA: Diagnosis not present

## 2014-11-20 DIAGNOSIS — S8251XD Displaced fracture of medial malleolus of right tibia, subsequent encounter for closed fracture with routine healing: Secondary | ICD-10-CM | POA: Diagnosis not present

## 2014-11-20 DIAGNOSIS — J449 Chronic obstructive pulmonary disease, unspecified: Secondary | ICD-10-CM | POA: Diagnosis not present

## 2014-11-24 DIAGNOSIS — S82841D Displaced bimalleolar fracture of right lower leg, subsequent encounter for closed fracture with routine healing: Secondary | ICD-10-CM | POA: Diagnosis not present

## 2014-12-04 ENCOUNTER — Ambulatory Visit: Payer: Medicare Other | Attending: Orthopaedic Surgery | Admitting: Physical Therapy

## 2014-12-04 ENCOUNTER — Encounter: Payer: Self-pay | Admitting: Physical Therapy

## 2014-12-04 DIAGNOSIS — Z9889 Other specified postprocedural states: Secondary | ICD-10-CM

## 2014-12-04 DIAGNOSIS — Z96659 Presence of unspecified artificial knee joint: Secondary | ICD-10-CM | POA: Diagnosis not present

## 2014-12-04 DIAGNOSIS — I129 Hypertensive chronic kidney disease with stage 1 through stage 4 chronic kidney disease, or unspecified chronic kidney disease: Secondary | ICD-10-CM | POA: Diagnosis not present

## 2014-12-04 DIAGNOSIS — N182 Chronic kidney disease, stage 2 (mild): Secondary | ICD-10-CM | POA: Insufficient documentation

## 2014-12-04 DIAGNOSIS — W010XXD Fall on same level from slipping, tripping and stumbling without subsequent striking against object, subsequent encounter: Secondary | ICD-10-CM | POA: Insufficient documentation

## 2014-12-04 DIAGNOSIS — M25562 Pain in left knee: Secondary | ICD-10-CM | POA: Diagnosis not present

## 2014-12-04 DIAGNOSIS — E039 Hypothyroidism, unspecified: Secondary | ICD-10-CM | POA: Insufficient documentation

## 2014-12-04 DIAGNOSIS — S82841D Displaced bimalleolar fracture of right lower leg, subsequent encounter for closed fracture with routine healing: Secondary | ICD-10-CM | POA: Insufficient documentation

## 2014-12-04 DIAGNOSIS — Z8781 Personal history of (healed) traumatic fracture: Secondary | ICD-10-CM

## 2014-12-04 NOTE — Patient Instructions (Signed)
Discussed using red band (offered yellow band but patient declined stating she could use red one she already has) to do plantar flexion.  Discussed having her husband help with other motions but he is very ill with cancer and unable to assist.

## 2014-12-04 NOTE — Therapy (Signed)
[place], [place], [address] Phone: [phone]   Fax:  [phone]  Physical Therapy Evaluation  Patient Details  Name: Tammie [name] MRN: [id number] Date of Birth: [birth year] Referring Provider:  [name], MD  Encounter Date: 12/04/2014      PT End of Session - 12/04/14 1721    Visit Number 1   Number of Visits 16   Date for PT Re-Evaluation 01/29/15   PT Start Time 1500   PT Stop Time 1547   PT Time Calculation (min) 47 min   Activity Tolerance Patient tolerated treatment well      Past Medical History  Diagnosis Date  . HTN (hypertension)   . Renal cell cancer   . Glaucoma   . Hypothyroidism   . Colon adenoma   . Chronic kidney disease   . Depression     Past Surgical History  Procedure Laterality Date  . Total abdominal hysterectomy    . Kidney surgery    . Replacement total knee    . Joint replacement    . Orif ankle fracture Right 09/01/2014    Procedure: OPEN REDUCTION INTERNAL FIXATION (ORIF) ANKLE FRACTURE;  Surgeon: [name], MD;  Location: [facility];  Service: Orthopedics;  Laterality: Right;    There were no vitals taken for this visit.  Visit Diagnosis:  Status post ORIF of fracture of ankle - Plan: PT plan of care cert/re-cert  Left knee pain - Plan: PT plan of care cert/re-cert      Subjective Assessment - 12/04/14 1507    Symptoms In October tired from cooking for family.  Fell asleep on sofa.  Got up to turn off lights. Shoes got tangled up and fell.  Went upstairs and went to bed.  When she tried to get out of bed she was unable to walk and called 911.  Took x-rays, sustained right bilmalleolar fracture.  Had right ankle ORIF.  NWB for 4 weeks so used a wheelchair.  Used BSC.  Wore brace for 4 weeks.     Pertinent History TKR 2012;  scoliosis/osteoporsis in back   How long can you walk comfortably? Went to [employer] and couldn't do very well even  with RW.     Patient Stated Goals walk like I used to without an assistive device; go shopping;  return to normal cooking/cleaning   Currently in Pain? Yes   Pain Score 2    Pain Location Ankle   Pain Orientation Right   Pain Type Surgical pain   Aggravating Factors  Lifting foot with driving;  standing too long to fry fish   Multiple Pain Sites Yes   Pain Score 1   Pain Type Acute pain   Pain Location Knee   Pain Orientation Left   Pain Descriptors / Indicators Burning   Pain Frequency Intermittent          [facility] PT Assessment - 12/04/14 1520    Assessment   Medical Diagnosis s/p right ankle ORIF; left knee pain   Onset Date 08/30/14   Next MD Visit released from MD   Prior Therapy HHPT just finished last week   Precautions   Precautions None   Restrictions   Weight Bearing Restrictions No   Balance Screen   Has the patient fallen in the past 6 months Yes   How many times? --  Previous fall in the yard 2-3 years ago   Has the patient had a  decrease in activity level because of a fear of falling?  No   Is the patient reluctant to leave their home because of a fear of falling?  No   Home Environment   Living Enviornment Private residence   Living Arrangements Spouse/significant other   Available Help at Discharge Family   Type of [place] to enter   Home Layout Two level   Alternate Level Stairs-Rails Can reach both   Prior Function   Level of Independence Independent with basic ADLs   Leisure working in the yard   Observation/Other Assessments   Lower Extremity Functional Scale  --  do next visit   Posture/Postural Control   Posture Comments --  mild swelling Fig 8 54cm right;  incisions appear healed   AROM   Right Knee Extension 0   Right Knee Flexion 135   Left Knee Extension 3   Left Knee Flexion 117   Right Ankle Dorsiflexion -3   Right Ankle Plantar Flexion 48   Right Ankle Inversion 37   Right Ankle Eversion 18   Left Ankle  Dorsiflexion 4   Left Ankle Plantar Flexion 48   Left Ankle Inversion 50   Left Ankle Eversion 16   Strength   Right Knee Flexion 4+/5   Right Knee Extension 4+/5   Left Knee Flexion 4/5   Left Knee Extension 4/5   Right Ankle Dorsiflexion 3/5   Right Ankle Plantar Flexion 3/5   Right Ankle Inversion 3/5   Right Ankle Eversion 3/5   Left Ankle Dorsiflexion 5/5   Left Ankle Plantar Flexion 5/5   Left Ankle Inversion 5/5   Left Ankle Eversion 5/5   Ambulation/Gait   Ambulation/Gait Yes   Assistive device Rolling walker   Gait velocity .31m/sec  10 m walk 20.31 with RW   Gait Comments --  TUG 19.50 with RW   Functional Gait  Assessment   Gait assessed  Yes                          PT Education - 12/04/14 1720    Education provided Yes   Education Details red band ankle dorsiflexion, use of ice for swelling control   Person(s) Educated Patient   Methods Explanation;Demonstration   Comprehension Verbalized understanding          PT Short Term Goals - 12/04/14 1734    PT SHORT TERM GOAL #1   Title Gait speed improved to .65 m/sec indicating decreased incidence for adverse effects.   Time 4   Period Weeks   Status New   PT SHORT TERM GOAL #2   Title Gait with cane (SPC or QC) 100 feet with CGA needed for very short household distances.     Time 4   Period Weeks   Status New   PT SHORT TERM GOAL #3   Title Right ankle dorsiflexion to 0 degrees needed for greater ease with going up and down steps at home.    Time 4   Period Weeks   Status New   PT SHORT TERM GOAL #4   Title Left knee extension to 0 degrees, improved HS length needed for walking community distances with less knee pain   Time 4   Period Weeks   Status New           PT Long Term Goals - 12/04/14 1738    PT LONG TERM GOAL #1  Title "Pt will be independent with advanced HEP.    Time 8   Period Weeks   Status New   PT LONG TERM GOAL #2   Title Right ankle strength improved  to grossly 4/5 needed for standing/walking community distances.   Time 8   Period Weeks   Status New   PT LONG TERM GOAL #3   Title Patient will be able to walk 500 feet on level surfaces indepedently for household and short community distances.   Time 8   Period Weeks   Status New   PT LONG TERM GOAL #4   Title Patient will have right ankle DF of 3 degrees needed for greater ease ascending/descending steps at home and negotiating curbs.     Time 8   Period Weeks   Status New   PT LONG TERM GOAL #5   Title Gait speed of at least .72m/sec needed for community ambulation with less chance of adverse events.    Time 8   Period Weeks   Status New   Additional Long Term Goals   Additional Long Term Goals Yes   PT LONG TERM GOAL #6   Title Patient will report overall improvement in right ankle pain and left knee pain and return to usual home cooking/cleaning activities > 75%   Time 8   Period Weeks   Status New               Plan - 12/04/14 1721    Clinical Impression Statement The patient is a pleasant 73 year old who previously walked community distances without an assistive device and was able to do all the cooking and cleaning at home.  She got her shoes tangled and fell when she got up during the night in October resulting in a right bimalleolar fx.  She underwent ankle ORIF on 09/01/14.  She was non weight bearing for 4 weeks and was wheelchair bound.  She then progressed to a brace fro 4 weeks.  She had x-rays earlier this month and the doctor said she was fully healed and she could walk without the brace and no weight bearing restrictions.  She presents today with a RW.  She completed HHPT last week.  She is able to drive now and presents to PT hoping to strengthen her ankle and progress off the RW.  During the course of her recuperation she began having left knee pain (she had TKR a few years ago).  She would like to address this problem as well and it is included in the  referral.  Mild ankle swelling.  Decreased right ankle AROM:  DF lacks 3 degrees from neutral, PF 48, INV 37, EV 18 degrees.  Right ankle strength grossly 3/5 including toe intrinsics.  Left knee AROM 3-117 degrees with decreased HS length.  Decreased gait speed putting patient at a significantly higher incidence of adverse events and is lacking in speed needed for safe community ambulation.  Patient does have a history of LBP which may affect progress with rehab.  Also of note, the patient is assisting her husband quite a bit.  She states he has lung and brain cancer.     Pt will benefit from skilled therapeutic intervention in order to improve on the following deficits Abnormal gait;Decreased activity tolerance;Decreased mobility;Decreased range of motion;Decreased strength;Difficulty walking;Impaired flexibility;Pain   Rehab Potential Good   Clinical Impairments Affecting Rehab Potential LBP; lack of assistance at home   PT Frequency 2x / week  PT Duration 8 weeks   PT Treatment/Interventions ADLs/Self Care Home Management;Cryotherapy;Electrical Stimulation;Moist Heat;Ultrasound;Therapeutic activities;Therapeutic exercise;Balance training;Neuromuscular re-education;Patient/family education;Manual techniques;Passive range of motion   PT Next Visit Plan Do LEFS, ankle band exercises (yellow or red); toe intrinsic exercises; Nu-Step; Standing weight shifting;  ice/elevation or vasocompression          G-Codes - 12-31-2014 1745    Functional Assessment Tool Used Clinical judgement; 10 m walk, TUG   Functional Limitation Mobility: Walking and moving around   Mobility: Walking and Moving Around Current Status ([id number]) At least 60 percent but less than 80 percent impaired, limited or restricted   Mobility: Walking and Moving Around Goal Status [phone]) At least 40 percent but less than 60 percent impaired, limited or restricted       Problem List Patient Active Problem List   Diagnosis Date Noted   . Ankle fracture, bimalleolar, closed 08/31/2014  . CKD (chronic kidney disease) stage 2, GFR 60-89 ml/min 08/31/2014  . Uncontrolled pain 08/31/2014  . Hypothyroidism 08/31/2014  . Nocturnal hypoxemia 03/28/2014    Alvera Singh 2014/12/31, 5:48 PM  [facility] [address] [place], [place], [address] Phone: [phone]   Fax:  [phone]  Ruben Im, [place] 12/31/2014 5:48 PM Phone: [phone] Fax: [phone]

## 2014-12-10 ENCOUNTER — Ambulatory Visit: Payer: Medicare Other | Admitting: Physical Therapy

## 2014-12-10 DIAGNOSIS — N182 Chronic kidney disease, stage 2 (mild): Secondary | ICD-10-CM | POA: Diagnosis not present

## 2014-12-10 DIAGNOSIS — M25562 Pain in left knee: Secondary | ICD-10-CM | POA: Diagnosis not present

## 2014-12-10 DIAGNOSIS — S82841D Displaced bimalleolar fracture of right lower leg, subsequent encounter for closed fracture with routine healing: Secondary | ICD-10-CM | POA: Diagnosis not present

## 2014-12-10 DIAGNOSIS — I129 Hypertensive chronic kidney disease with stage 1 through stage 4 chronic kidney disease, or unspecified chronic kidney disease: Secondary | ICD-10-CM | POA: Diagnosis not present

## 2014-12-10 DIAGNOSIS — E039 Hypothyroidism, unspecified: Secondary | ICD-10-CM | POA: Diagnosis not present

## 2014-12-10 DIAGNOSIS — Z9889 Other specified postprocedural states: Principal | ICD-10-CM

## 2014-12-10 DIAGNOSIS — Z96659 Presence of unspecified artificial knee joint: Secondary | ICD-10-CM | POA: Diagnosis not present

## 2014-12-10 DIAGNOSIS — Z8781 Personal history of (healed) traumatic fracture: Secondary | ICD-10-CM

## 2014-12-10 NOTE — Therapy (Signed)
[place], [place], [address] Phone: [phone]   Fax:  [phone]  Physical Therapy Treatment  Patient Details  Name: Tammie [name] MRN: [id number] Date of Birth: 1941-08-25 Referring Provider:  [name], MD  Encounter Date: 12/10/2014      PT End of Session - 12/10/14 1733    Visit Number 2   Number of Visits 16   Date for PT Re-Evaluation 01/29/15   PT Start Time 1632   PT Stop Time 0160   PT Time Calculation (min) 43 min   Activity Tolerance Patient limited by pain;Patient tolerated treatment well      Past Medical History  Diagnosis Date  . HTN (hypertension)   . Renal cell cancer   . Glaucoma   . Hypothyroidism   . Colon adenoma   . Chronic kidney disease   . Depression     Past Surgical History  Procedure Laterality Date  . Total abdominal hysterectomy    . Kidney surgery    . Replacement total knee    . Joint replacement    . Orif ankle fracture Right 09/01/2014    Procedure: OPEN REDUCTION INTERNAL FIXATION (ORIF) ANKLE FRACTURE;  Surgeon: [name], MD;  Location: [place];  Service: Orthopedics;  Laterality: Right;    There were no vitals taken for this visit.  Visit Diagnosis:  Status post ORIF of fracture of ankle        [facility] PT Assessment - 12/10/14 1640    Observation/Other Assessments   Lower Extremity Functional Scale  34/80                  [facility] Adult PT Treatment/Exercise - 12/10/14 1640    Ambulation/Gait   Ambulation/Gait Yes   Ambulation/Gait Assistance --  using counter and chair back, 8 feet 5 times   Ambulation Surface Level   Gait Comments weight shifts, painful.  better after fibula a/p mobilization  uses Rt toes  more post exercise   Knee/Hip Exercises: Aerobic   Stationary Bike Nustep, 6 minutes level5   Manual Therapy   Joint Mobilization Rt ankle, talus glides during DF(mobilization with movement)   Ankle  Exercises: Seated   ABC's --  print first and last name   Ankle Circles/Pumps --  10 reps   Towel Crunch Limitations --  10 reps   Towel Inversion/Eversion --  10 reps, 2 lbs easy   Towel Inversion/Eversion Weights (lbs) 10 reps 2 lbs easy   [place] Pickup 12   Other Seated Ankle Exercises red band 4 way 2 sets of tem                  PT Short Term Goals - 12/10/14 1739    PT SHORT TERM GOAL #1   Title Gait speed improved to .65 m/sec indicating decreased incidence for adverse effects.   Time 4   Period Weeks   Status Unable to assess   PT SHORT TERM GOAL #2   Title Gait with cane (SPC or QC) 100 feet with CGA needed for very short household distances.     Time 4   Period Weeks   Status On-going   PT SHORT TERM GOAL #3   Title Right ankle dorsiflexion to 0 degrees needed for greater ease with going up and down steps at home.    Time 4   Period Weeks   Status On-going   PT SHORT TERM GOAL #4  Title Left knee extension to 0 degrees, improved HS length needed for walking community distances with less knee pain   Time 4   Period Weeks   Status Unable to assess           PT Long Term Goals - 12/10/14 1740    PT LONG TERM GOAL #1   Title "Pt will be independent with advanced HEP.    Time 8   Period Weeks   Status On-going   PT LONG TERM GOAL #2   Time 8   Period Weeks   Status Unable to assess   PT LONG TERM GOAL #3   Title Patient will be able to walk 500 feet on level surfaces indepedently for household and short community distances.   Time 8   Period Weeks   Status On-going               Plan - 12/10/14 1734    Clinical Impression Statement Able to begin exercises, gait training.  Patient has intermittant pain, mid foot , medial lateral foot and posterior foot.  Improving somewhat with manual and gxercise.  She declined cold, will do at home.   PT Next Visit Plan tape next visit for fibula position or [facility]) or create space mid foot,  tape knee?  continue ankle strengthening, try standing both heel lifts, toe lifts.   See if she wants to get a band for home exercises.   Consulted and Agree with Plan of Care Patient        Problem List Patient Active Problem List   Diagnosis Date Noted  . Ankle fracture, bimalleolar, closed 08/31/2014  . CKD (chronic kidney disease) stage 2, GFR 60-89 ml/min 08/31/2014  . Uncontrolled pain 08/31/2014  . Hypothyroidism 08/31/2014  . Nocturnal hypoxemia 03/28/2014   Melvenia Needles, PTA 12/10/2014 5:41 PM Phone: [phone] Fax: [phone]  [facility] 12/10/2014, 5:41 PM  [place], [place], [address] Phone: [phone]   Fax:  [phone]

## 2014-12-16 ENCOUNTER — Ambulatory Visit: Payer: Medicare Other | Attending: Orthopaedic Surgery | Admitting: Physical Therapy

## 2014-12-16 ENCOUNTER — Telehealth: Payer: Self-pay | Admitting: *Deleted

## 2014-12-16 DIAGNOSIS — Z8781 Personal history of (healed) traumatic fracture: Secondary | ICD-10-CM

## 2014-12-16 DIAGNOSIS — M25562 Pain in left knee: Secondary | ICD-10-CM | POA: Insufficient documentation

## 2014-12-16 DIAGNOSIS — N182 Chronic kidney disease, stage 2 (mild): Secondary | ICD-10-CM | POA: Insufficient documentation

## 2014-12-16 DIAGNOSIS — X58XXXD Exposure to other specified factors, subsequent encounter: Secondary | ICD-10-CM | POA: Diagnosis not present

## 2014-12-16 DIAGNOSIS — S82841D Displaced bimalleolar fracture of right lower leg, subsequent encounter for closed fracture with routine healing: Secondary | ICD-10-CM | POA: Diagnosis not present

## 2014-12-16 DIAGNOSIS — I129 Hypertensive chronic kidney disease with stage 1 through stage 4 chronic kidney disease, or unspecified chronic kidney disease: Secondary | ICD-10-CM | POA: Diagnosis not present

## 2014-12-16 DIAGNOSIS — Z96659 Presence of unspecified artificial knee joint: Secondary | ICD-10-CM | POA: Diagnosis not present

## 2014-12-16 DIAGNOSIS — E039 Hypothyroidism, unspecified: Secondary | ICD-10-CM | POA: Diagnosis not present

## 2014-12-16 DIAGNOSIS — Z9889 Other specified postprocedural states: Secondary | ICD-10-CM

## 2014-12-16 NOTE — Patient Instructions (Signed)
Remove tape if irritating 

## 2014-12-16 NOTE — Telephone Encounter (Signed)
appts made and printed...td 

## 2014-12-16 NOTE — Therapy (Signed)
[place], [place], [address] Phone: [phone]   Fax:  [phone]  Physical Therapy Treatment  Patient Details  Name: Tammie [name] MRN: [id number] Date of Birth: [birth year] Referring Provider:  [name], *  Encounter Date: 12/16/2014      PT End of Session - 12/16/14 1806    PT Start Time 1503   PT Stop Time 1555   PT Time Calculation (min) 52 min   Activity Tolerance Patient tolerated treatment well;Patient limited by pain      Past Medical History  Diagnosis Date  . HTN (hypertension)   . Renal cell cancer   . Glaucoma   . Hypothyroidism   . Colon adenoma   . Chronic kidney disease   . Depression     Past Surgical History  Procedure Laterality Date  . Total abdominal hysterectomy    . Kidney surgery    . Replacement total knee    . Joint replacement    . Orif ankle fracture Right 09/01/2014    Procedure: OPEN REDUCTION INTERNAL FIXATION (ORIF) ANKLE FRACTURE;  Surgeon: [name], MD;  Location: [facility];  Service: Orthopedics;  Laterality: Right;    There were no vitals taken for this visit.  Visit Diagnosis:  Status post ORIF of fracture of ankle  Left knee pain      Subjective Assessment - 12/16/14 1522    Symptoms walks for exercise worked bands for PF /DF 10 minutes on the bicycle, recumbant.  Anterior foot fain at rest. Has noticed all Rt shoes worn down laterallyt.  Pain less than the other day less.   Aggravating Factors  driving   Multiple Pain Sites Yes   Pain Score 2                    [facility] Adult PT Treatment/Exercise - 12/16/14 1500    Knee/Hip Exercises: Aerobic   Stationary Bike Nustep 7 minutes   Manual Therapy   Manual Therapy --  taping Rt foot,Lt kneetoactivateinhibitcreatespace+edema    Ankle Exercises: Seated   Ankle Circles/Pumps 10 reps   Towel Crunch --  10, for arch area   Heel Raises 20 reps   Toe Raise 20 reps    Heel Slides 5 reps   Other Seated Ankle Exercises weight shift medial foot, hold 5 seconds, 10 reps                  PT Short Term Goals - 12/16/14 1809    PT SHORT TERM GOAL #1   Title Gait speed improved to .65 m/sec indicating decreased incidence for adverse effects.   Time 4   Period Weeks   Status Unable to assess   PT SHORT TERM GOAL #2   Title Gait with cane (SPC or QC) 100 feet with CGA needed for very short household distances.     Time 4   Period Weeks   Status On-going   PT SHORT TERM GOAL #3   Title Right ankle dorsiflexion to 0 degrees needed for greater ease with going up and down steps at home.    Time 4   Period Weeks   Status Unable to assess   PT SHORT TERM GOAL #4   Title Left knee extension to 0 degrees, improved HS length needed for walking community distances with less knee pain   Time 4   Period Weeks   Status Unable to assess  PT Long Term Goals - 12/10/14 1740    PT LONG TERM GOAL #1   Title "Pt will be independent with advanced HEP.    Time 8   Period Weeks   Status On-going   PT LONG TERM GOAL #2   Time 8   Period Weeks   Status Unable to assess   PT LONG TERM GOAL #3   Title Patient will be able to walk 500 feet on level surfaces indepedently for household and short community distances.   Time 8   Period Weeks   Status On-going               Plan - 12/16/14 1807    Clinical Impression Statement taping at patient's request,  some exercises focused on foot posture,  taping did not help foot pain, it did help knee pain.    PT Next Visit Plan standing foot strength, she will try to bring a shoe for exercise   Consulted and Agree with Plan of Care Patient        Problem List Patient Active Problem List   Diagnosis Date Noted  . Ankle fracture, bimalleolar, closed 08/31/2014  . CKD (chronic kidney disease) stage 2, GFR 60-89 ml/min 08/31/2014  . Uncontrolled pain 08/31/2014  . Hypothyroidism 08/31/2014   . Nocturnal hypoxemia 03/28/2014   Melvenia Needles, PTA 12/16/2014 6:11 PM Phone: [phone] Fax: [phone]  Melvenia Needles 12/16/2014, 6:11 PM  [facility] [address] [place], [place], [address] Phone: [phone]   Fax:  [phone]

## 2014-12-18 ENCOUNTER — Ambulatory Visit: Payer: Medicare Other | Admitting: Physical Therapy

## 2014-12-18 DIAGNOSIS — I129 Hypertensive chronic kidney disease with stage 1 through stage 4 chronic kidney disease, or unspecified chronic kidney disease: Secondary | ICD-10-CM | POA: Diagnosis not present

## 2014-12-18 DIAGNOSIS — S82841D Displaced bimalleolar fracture of right lower leg, subsequent encounter for closed fracture with routine healing: Secondary | ICD-10-CM | POA: Diagnosis not present

## 2014-12-18 DIAGNOSIS — Z8781 Personal history of (healed) traumatic fracture: Secondary | ICD-10-CM

## 2014-12-18 DIAGNOSIS — M25562 Pain in left knee: Secondary | ICD-10-CM

## 2014-12-18 DIAGNOSIS — E039 Hypothyroidism, unspecified: Secondary | ICD-10-CM | POA: Diagnosis not present

## 2014-12-18 DIAGNOSIS — Z9889 Other specified postprocedural states: Principal | ICD-10-CM

## 2014-12-18 DIAGNOSIS — Z96659 Presence of unspecified artificial knee joint: Secondary | ICD-10-CM | POA: Diagnosis not present

## 2014-12-18 DIAGNOSIS — N182 Chronic kidney disease, stage 2 (mild): Secondary | ICD-10-CM | POA: Diagnosis not present

## 2014-12-18 NOTE — Therapy (Signed)
[place], [place], [address] Phone: [phone]   Fax:  [phone]  Physical Therapy Treatment  Patient Details  Name: Tammie [name] MRN: [id number] Date of Birth: 1941-08-09 Referring Provider:  [name], *  Encounter Date: 12/18/2014      PT End of Session - 12/18/14 1721    Visit Number 4   Number of Visits 16   Date for PT Re-Evaluation 01/29/15   PT Start Time 6962   PT Stop Time 1710   PT Time Calculation (min) 78 min   Activity Tolerance Patient tolerated treatment well;Patient limited by pain      Past Medical History  Diagnosis Date  . HTN (hypertension)   . Renal cell cancer   . Glaucoma   . Hypothyroidism   . Colon adenoma   . Chronic kidney disease   . Depression     Past Surgical History  Procedure Laterality Date  . Total abdominal hysterectomy    . Kidney surgery    . Replacement total knee    . Joint replacement    . Orif ankle fracture Right 09/01/2014    Procedure: OPEN REDUCTION INTERNAL FIXATION (ORIF) ANKLE FRACTURE;  Surgeon: [name], MD;  Location: [facility];  Service: Orthopedics;  Laterality: Right;    There were no vitals taken for this visit.  Visit Diagnosis:  Status post ORIF of fracture of ankle  Left knee pain      Subjective Assessment - 12/18/14 1556    Symptoms Wearing shoes for th first time since 15 OCT.  They feel tight.  Using quad cane today.  She put her walker in the attic.   Currently in Pain? Yes   Pain Score 2    Pain Location Ankle   Pain Orientation Right   Aggravating Factors  shoes, sore.  Driving, pushing on the gas. Walking   Pain Relieving Factors rest   Pain Score 3   Pain Type Chronic pain   Pain Location Knee   Pain Orientation Left   Pain Radiating Towards Back  .  Thinks pain is connected to back pain   Pain Descriptors / Indicators --  pain   Pain Frequency Intermittent                     [facility] Adult PT Treatment/Exercise - 12/18/14 1608    Ambulation/Gait   Ambulation/Gait Yes   Ambulation/Gait Assistance 6: Modified independent (Device/Increase time)  quad cane, adjusted   Ambulation Distance (Feet) 60 Feet   Assistive device Small based quad cane   Gait Pattern Step-through pattern;Step-to pattern   Ambulation Surface Level   Gait Comments cued, tried Rt/Lt hand.     Knee/Hip Exercises: Aerobic   Stationary Bike Nustep  10   Knee/Hip Exercises: Machines for Strengthening   Cybex Knee Extension 1 plate   Cybex Knee Flexion 2 plates   Knee/Hip Exercises: Standing   Heel Raises 2 sets  1 set on softer surface due to pain from floor.    Forward Step Up 10 reps;Both;Hand Hold: 2;Step Height: 4"   Rocker Board --  4 footed foam board  PF/DF   Cryotherapy   Type of Cryotherapy Other (comment)  game ready, moderate pressure                  PT Short Term Goals - 12/18/14 1724    PT SHORT TERM GOAL #1   Title Gait  speed improved to .65 m/sec indicating decreased incidence for adverse effects.   Time 4   Period Weeks   Status On-going   PT SHORT TERM GOAL #2   Title Gait with cane (SPC or QC) 100 feet with CGA needed for very short household distances.     Time 4   Period Weeks   Status Achieved   PT SHORT TERM GOAL #3   Title Right ankle dorsiflexion to 0 degrees needed for greater ease with going up and down steps at home.    Time 4   Status Unable to assess   PT SHORT TERM GOAL #4   Title Left knee extension to 0 degrees, improved HS length needed for walking community distances with less knee pain   Time 4   Period Weeks   Status Unable to assess           PT Long Term Goals - 12/18/14 1725    PT LONG TERM GOAL #1   Title "Pt will be independent with advanced HEP.    Time 8   Period Weeks   Status On-going   PT LONG TERM GOAL #2   Title Right ankle strength improved to grossly 4/5 needed for  standing/walking community distances.   Time 8   Period Weeks   Status On-going   PT LONG TERM GOAL #3   Title Patient will be able to walk 500 feet on level surfaces indepedently for household and short community distances.   Time 8   Status On-going   PT LONG TERM GOAL #4   Title Patient will have right ankle DF of 3 degrees needed for greater ease ascending/descending steps at home and negotiating curbs.     Time 8   Period Weeks   Status Unable to assess   PT LONG TERM GOAL #5   Title Gait speed of at least .64m/sec needed for community ambulation with less chance of adverse events.    Time 8   Period Weeks   Status On-going               Plan - 12/18/14 1722    Clinical Impression Statement Able to wear shoes and begin  closed chain exercises.   and gait training   PT Next Visit Plan wear non rocker shoes.  progress standing exercises.   Consulted and Agree with Plan of Care Patient        Problem List Patient Active Problem List   Diagnosis Date Noted  . Ankle fracture, bimalleolar, closed 08/31/2014  . CKD (chronic kidney disease) stage 2, GFR 60-89 ml/min 08/31/2014  . Uncontrolled pain 08/31/2014  . Hypothyroidism 08/31/2014  . Nocturnal hypoxemia 03/28/2014   Melvenia Needles, PTA 12/18/2014 5:28 PM Phone: [phone] Fax: [phone]  Melvenia Needles 12/18/2014, 5:28 PM  [facility] [address] [place], [place], [address] Phone: [phone]   Fax:  [phone]

## 2014-12-23 ENCOUNTER — Ambulatory Visit: Payer: Medicare Other | Admitting: Physical Therapy

## 2014-12-23 DIAGNOSIS — S82841D Displaced bimalleolar fracture of right lower leg, subsequent encounter for closed fracture with routine healing: Secondary | ICD-10-CM | POA: Diagnosis not present

## 2014-12-23 DIAGNOSIS — Z8781 Personal history of (healed) traumatic fracture: Secondary | ICD-10-CM

## 2014-12-23 DIAGNOSIS — Z9889 Other specified postprocedural states: Principal | ICD-10-CM

## 2014-12-23 DIAGNOSIS — I129 Hypertensive chronic kidney disease with stage 1 through stage 4 chronic kidney disease, or unspecified chronic kidney disease: Secondary | ICD-10-CM | POA: Diagnosis not present

## 2014-12-23 DIAGNOSIS — M25562 Pain in left knee: Secondary | ICD-10-CM | POA: Diagnosis not present

## 2014-12-23 DIAGNOSIS — Z96659 Presence of unspecified artificial knee joint: Secondary | ICD-10-CM | POA: Diagnosis not present

## 2014-12-23 DIAGNOSIS — E039 Hypothyroidism, unspecified: Secondary | ICD-10-CM | POA: Diagnosis not present

## 2014-12-23 DIAGNOSIS — N182 Chronic kidney disease, stage 2 (mild): Secondary | ICD-10-CM | POA: Diagnosis not present

## 2014-12-23 NOTE — Therapy (Signed)
[place], [place], [address] Phone: [phone]   Fax:  [phone]  Physical Therapy Treatment  Patient Details  Name: Tammie [name] MRN: [id number] Date of Birth: 1941-06-14 Referring Provider:  [name], *  Encounter Date: 12/23/2014      PT End of Session - 12/23/14 1722    Visit Number 5   Number of Visits 16   Date for PT Re-Evaluation 01/29/15   PT Start Time 1937   PT Stop Time 9024   PT Time Calculation (min) 55 min   Activity Tolerance Patient tolerated treatment well      Past Medical History  Diagnosis Date  . HTN (hypertension)   . Renal cell cancer   . Glaucoma   . Hypothyroidism   . Colon adenoma   . Chronic kidney disease   . Depression     Past Surgical History  Procedure Laterality Date  . Total abdominal hysterectomy    . Kidney surgery    . Replacement total knee    . Joint replacement    . Orif ankle fracture Right 09/01/2014    Procedure: OPEN REDUCTION INTERNAL FIXATION (ORIF) ANKLE FRACTURE;  Surgeon: [name], MD;  Location: [facility];  Service: Orthopedics;  Laterality: Right;    There were no vitals taken for this visit.  Visit Diagnosis:  Status post ORIF of fracture of ankle  Left knee pain      Subjective Assessment - 12/23/14 1549    Symptoms Knee pain > ankle pain.  Presents with QC today.  States her husband is going to receive Hospice services at home.  States he had to push her husband around in the wheelchair 2 floors at the doctors office yesterday.   Currently in Pain? Yes   Pain Score 2    Pain Location Ankle   Pain Orientation Right   Pain Score 3   Pain Location Knee   Pain Orientation Left          [facility] PT Assessment - 12/23/14 1555    AROM   Left Knee Extension 0   Left Knee Flexion 128   Right Ankle Dorsiflexion 7   Right Ankle Plantar Flexion 48   Right Ankle Inversion 21   Right Ankle Eversion 28   Strength   Right Ankle Dorsiflexion 3+/5   Right Ankle Plantar Flexion 3+/5   Right Ankle Inversion 3+/5   Right Ankle Eversion 3+/5   Ambulation/Gait   Gait velocity 10 m with QC  21.22 with QC                  [facility] Adult PT Treatment/Exercise - 12/23/14 1554    Exercises   Exercises Knee/Hip   Knee/Hip Exercises: Aerobic   Stationary Bike Nustep  10 L5   Knee/Hip Exercises: Machines for Strengthening   Total Gym Leg Press bilateral 20# 30x   Knee/Hip Exercises: Standing   Heel Raises 20 reps   Forward Step Up Right;10 reps;Hand Hold: 1;Step Height: 4"   Step Down Left;10 reps;Hand Hold: 1;Step Height: 2"   Rocker Board 3 minutes   SLS SLS right with right 4 ways 10x   Other Standing Knee Exercises 2" up and over 10x   Cryotherapy   Number Minutes Cryotherapy 15 Minutes   Cryotherapy Location Ankle;Knee   Type of Cryotherapy Other (comment);Ice pack  vasocompression mod compress coldest setting ankle;pack knee  PT Short Term Goals - 12/23/14 1727    PT SHORT TERM GOAL #1   Baseline .53m/sec   Time 4   Period Weeks   Status On-going   PT SHORT TERM GOAL #2   Title Gait with cane (SPC or QC) 100 feet with CGA needed for very short household distances.     Status Achieved   PT SHORT TERM GOAL #3   Title Right ankle dorsiflexion to 0 degrees needed for greater ease with going up and down steps at home.    Status Achieved   PT SHORT TERM GOAL #4   Title Left knee extension to 0 degrees, improved HS length needed for walking community distances with less knee pain   Status Achieved           PT Long Term Goals - 12/23/14 1728    PT LONG TERM GOAL #1   Title "Pt will be independent with advanced HEP.    Time 8   Period Weeks   Status On-going   PT LONG TERM GOAL #2   Title Right ankle strength improved to grossly 4/5 needed for standing/walking community distances.   Time 8   Period Weeks   Status On-going   PT LONG TERM  GOAL #3   Title Patient will be able to walk 500 feet on level surfaces indepedently for household and short community distances.   Time 8   Period Weeks   Status On-going   PT LONG TERM GOAL #4   Title Patient will have right ankle DF of 3 degrees needed for greater ease ascending/descending steps at home and negotiating curbs.     Time 8   Period Weeks   Status Achieved   PT LONG TERM GOAL #5   Title Gait speed of at least .8m/sec needed for community ambulation with less chance of adverse events.    Time 8   Period Weeks   Status On-going   PT LONG TERM GOAL #6   Title Patient will report overall improvement in right ankle pain and left knee pain and return to usual home cooking/cleaning activities > 75%   Time 8   Period Weeks   Status On-going               Plan - 12/23/14 1723    Clinical Impression Statement Patient reports minor discomfort in ankle and both knees with closed chain exercises and therapist modified exercises accordingly.  Overall the patient is progressing well with good improvements in right ankle and left knee AROM and strength.  Progressing with STGs and LTGS.   PT Next Visit Plan Continue with progressive standing AROM, strengthening and proprioceptive exercises within pain limits; vasocompression right ankle, cold pack left knee as needed        Problem List Patient Active Problem List   Diagnosis Date Noted  . Ankle fracture, bimalleolar, closed 08/31/2014  . CKD (chronic kidney disease) stage 2, GFR 60-89 ml/min 08/31/2014  . Uncontrolled pain 08/31/2014  . Hypothyroidism 08/31/2014  . Nocturnal hypoxemia 03/28/2014    Alvera Singh 12/23/2014, 5:34 PM  [place], [place], [address] Phone: [phone]   Fax:  [phone]  Ruben Im, [place] 12/23/2014 5:34 PM Phone: [phone] Fax: [phone]

## 2014-12-23 NOTE — Patient Instructions (Signed)
Patient requests and was provided info on where to purchase cold pack used in clinic for home use.

## 2014-12-25 ENCOUNTER — Ambulatory Visit: Payer: Medicare Other | Admitting: Physical Therapy

## 2014-12-25 DIAGNOSIS — N182 Chronic kidney disease, stage 2 (mild): Secondary | ICD-10-CM | POA: Diagnosis not present

## 2014-12-25 DIAGNOSIS — M25562 Pain in left knee: Secondary | ICD-10-CM

## 2014-12-25 DIAGNOSIS — I129 Hypertensive chronic kidney disease with stage 1 through stage 4 chronic kidney disease, or unspecified chronic kidney disease: Secondary | ICD-10-CM | POA: Diagnosis not present

## 2014-12-25 DIAGNOSIS — S82841D Displaced bimalleolar fracture of right lower leg, subsequent encounter for closed fracture with routine healing: Secondary | ICD-10-CM | POA: Diagnosis not present

## 2014-12-25 DIAGNOSIS — E039 Hypothyroidism, unspecified: Secondary | ICD-10-CM | POA: Diagnosis not present

## 2014-12-25 DIAGNOSIS — Z96659 Presence of unspecified artificial knee joint: Secondary | ICD-10-CM | POA: Diagnosis not present

## 2014-12-25 NOTE — Therapy (Signed)
[place], [place], [address] Phone: [phone]   Fax:  [phone]  Physical Therapy Treatment  Patient Details  Name: Tammie [name] MRN: [id number] Date of Birth: 1941/05/08 Referring Provider:  [name], *  Encounter Date: 12/25/2014      PT End of Session - 12/25/14 1746    Visit Number 6   Number of Visits 16   Date for PT Re-Evaluation 01/29/15   PT Start Time 3709   PT Stop Time 1650   PT Time Calculation (min) 53 min   Activity Tolerance Patient tolerated treatment well;Patient limited by pain      Past Medical History  Diagnosis Date  . HTN (hypertension)   . Renal cell cancer   . Glaucoma   . Hypothyroidism   . Colon adenoma   . Chronic kidney disease   . Depression     Past Surgical History  Procedure Laterality Date  . Total abdominal hysterectomy    . Kidney surgery    . Replacement total knee    . Joint replacement    . Orif ankle fracture Right 09/01/2014    Procedure: OPEN REDUCTION INTERNAL FIXATION (ORIF) ANKLE FRACTURE;  Surgeon: [name], MD;  Location: [facility];  Service: Orthopedics;  Laterality: Right;    There were no vitals taken for this visit.  Visit Diagnosis:  Left knee pain      Subjective Assessment - 12/25/14 1610    Symptoms Sometimes she walks in house without cane.  Had to pick her husband up off floor two times today.  ankle aches , not pain, knee 2-3/10 Lt                     [facility] Adult PT Treatment/Exercise - 12/25/14 1558    Knee/Hip Exercises: Aerobic   Stationary Bike Nu step, L5, 8 minutes   Knee/Hip Exercises: Seated   Long Arc Quad Left;1 set;Weights  2 Lb.  10 second isometric holds, AA lift.   Other Seated Knee Exercises Hamstring curls 2 sets 10 reps, red band eack.   Knee/Hip Exercises: Supine   Short Arc Quad Sets Left;2 sets  2 LBS.., also manual resisted knee flexion 10 reps Lt   Heel  Slides AROM;Left;2 sets   Cryotherapy   Number Minutes Cryotherapy 15 Minutes   Cryotherapy Location Ankle;Knee   Type of Cryotherapy --  cold pack knee, vasopneumatic Ankle Rt   Manual Therapy   Manual Therapy Passive ROM  Rt ankle.   Ankle Exercises: Seated   Other Seated Ankle Exercises Manual resisted 4 way ankle                  PT Short Term Goals - 12/23/14 1727    PT SHORT TERM GOAL #1   Baseline .85msec   Time 4   Period Weeks   Status On-going   PT SHORT TERM GOAL #2   Title Gait with cane (SPC or QC) 100 feet with CGA needed for very short household distances.     Status Achieved   PT SHORT TERM GOAL #3   Title Right ankle dorsiflexion to 0 degrees needed for greater ease with going up and down steps at home.    Status Achieved   PT SHORT TERM GOAL #4   Title Left knee extension to 0 degrees, improved HS length needed for walking community distances with less knee pain   Status Achieved  PT Long Term Goals - 12/23/14 1728    PT LONG TERM GOAL #1   Title "Pt will be independent with advanced HEP.    Time 8   Period Weeks   Status On-going   PT LONG TERM GOAL #2   Title Right ankle strength improved to grossly 4/5 needed for standing/walking community distances.   Time 8   Period Weeks   Status On-going   PT LONG TERM GOAL #3   Title Patient will be able to walk 500 feet on level surfaces indepedently for household and short community distances.   Time 8   Period Weeks   Status On-going   PT LONG TERM GOAL #4   Title Patient will have right ankle DF of 3 degrees needed for greater ease ascending/descending steps at home and negotiating curbs.     Time 8   Period Weeks   Status Achieved   PT LONG TERM GOAL #5   Title Gait speed of at least .49msec needed for community ambulation with less chance of adverse events.    Time 8   Period Weeks   Status On-going   PT LONG TERM GOAL #6   Title Patient will report overall improvement  in right ankle pain and left knee pain and return to usual home cooking/cleaning activities > 75%   Time 8   Period Weeks   Status On-going               Plan - 12/25/14 1747    Clinical Impression Statement Pain flare in knee due to picking up her husband off floor 2 times today.  Less stressful exercises today.  Shorter session due to patient 10 minutes late or so. No new goals met.   PT Next Visit Plan Continue with progressive standing AROM, strengthening and proprioceptive exercises within pain limits; vasocompression right ankle, cold pack left knee as needed   Consulted and Agree with Plan of Care Patient        Problem List Patient Active Problem List   Diagnosis Date Noted  . Ankle fracture, bimalleolar, closed 08/31/2014  . CKD (chronic kidney disease) stage 2, GFR 60-89 ml/min 08/31/2014  . Uncontrolled pain 08/31/2014  . Hypothyroidism 08/31/2014  . Nocturnal hypoxemia 03/28/2014    Kamila Broda 12/25/2014, 5:51 PM KMelvenia Needles PTA 12/25/2014 5:51 PM Phone: [phone]: 3[facility] [address] [address] [place] [address]Phone: [phone]  Fax:  [phone]

## 2014-12-29 ENCOUNTER — Ambulatory Visit: Payer: Medicare Other | Admitting: Physical Therapy

## 2014-12-30 ENCOUNTER — Ambulatory Visit: Payer: Medicare Other | Admitting: Physical Therapy

## 2014-12-30 DIAGNOSIS — N182 Chronic kidney disease, stage 2 (mild): Secondary | ICD-10-CM | POA: Diagnosis not present

## 2014-12-30 DIAGNOSIS — I129 Hypertensive chronic kidney disease with stage 1 through stage 4 chronic kidney disease, or unspecified chronic kidney disease: Secondary | ICD-10-CM | POA: Diagnosis not present

## 2014-12-30 DIAGNOSIS — Z8781 Personal history of (healed) traumatic fracture: Secondary | ICD-10-CM

## 2014-12-30 DIAGNOSIS — S82841D Displaced bimalleolar fracture of right lower leg, subsequent encounter for closed fracture with routine healing: Secondary | ICD-10-CM | POA: Diagnosis not present

## 2014-12-30 DIAGNOSIS — M25562 Pain in left knee: Secondary | ICD-10-CM | POA: Diagnosis not present

## 2014-12-30 DIAGNOSIS — Z96659 Presence of unspecified artificial knee joint: Secondary | ICD-10-CM | POA: Diagnosis not present

## 2014-12-30 DIAGNOSIS — Z9889 Other specified postprocedural states: Secondary | ICD-10-CM

## 2014-12-30 DIAGNOSIS — E039 Hypothyroidism, unspecified: Secondary | ICD-10-CM | POA: Diagnosis not present

## 2014-12-30 NOTE — Therapy (Signed)
[place], [place], [address] Phone: [phone]   Fax:  [phone]  Physical Therapy Treatment  Patient Details  Name: Tammie [name] MRN: [id number] Date of Birth: [birth year] Referring Provider:  [name], *  Encounter Date: 12/30/2014      PT End of Session - 12/30/14 1720    Visit Number 7   Number of Visits 16   Date for PT Re-Evaluation 01/29/15   PT Start Time 1632   PT Stop Time 1730   PT Time Calculation (min) 58 min   Activity Tolerance Patient tolerated treatment well      Past Medical History  Diagnosis Date  . HTN (hypertension)   . Renal cell cancer   . Glaucoma   . Hypothyroidism   . Colon adenoma   . Chronic kidney disease   . Depression     Past Surgical History  Procedure Laterality Date  . Total abdominal hysterectomy    . Kidney surgery    . Replacement total knee    . Joint replacement    . Orif ankle fracture Right 09/01/2014    Procedure: OPEN REDUCTION INTERNAL FIXATION (ORIF) ANKLE FRACTURE;  Surgeon: [name], MD;  Location: [facility];  Service: Orthopedics;  Laterality: Right;    There were no vitals taken for this visit.  Visit Diagnosis:  Left knee pain  Status post ORIF of fracture of ankle      Subjective Assessment - 12/30/14 1638    Symptoms States her right knee is feeling better.  Presents with a QC.  Her husband has not fallen again since getting a hospital bed.     Currently in Pain? No/denies   Pain Score 0-No pain   Pain Location Ankle                    [facility] Adult PT Treatment/Exercise - 12/30/14 1716    Knee/Hip Exercises: Aerobic   Stationary Bike Nu step, L5, 10 minutes   Tread Mill 2 min .86mph   Knee/Hip Exercises: Machines for Strengthening   Cybex Leg Press 20# bilateral press 30x seat 6   Knee/Hip Exercises: Standing   Lateral Step Up 10 reps   Forward Step Up Right;10 reps;Hand Hold: 1;Step  Height: 4"   Rocker Board 3 minutes   SLS with Vectors 10x WB on right   Knee/Hip Exercises: Seated   Other Seated Knee Exercises Seated BAPS level 2 clockwise needs assist for motor planning   Cryotherapy   Number Minutes Cryotherapy 15 Minutes   Cryotherapy Location Ankle;Knee   Type of Cryotherapy Ice pack  vasocompression 32 degrees, moderate compression 15 min                  PT Short Term Goals - 12/30/14 1724    PT SHORT TERM GOAL #1   Title Gait speed improved to .65 m/sec indicating decreased incidence for adverse effects.   Time 4   Period Weeks   Status On-going   PT SHORT TERM GOAL #2   Title Gait with cane (SPC or QC) 100 feet with CGA needed for very short household distances.     Status Achieved   PT SHORT TERM GOAL #3   Title Right ankle dorsiflexion to 0 degrees needed for greater ease with going up and down steps at home.    Status Achieved   PT SHORT TERM GOAL #4   Title Left knee extension to  0 degrees, improved HS length needed for walking community distances with less knee pain   Status Achieved           PT Long Term Goals - 12/30/14 1725    PT LONG TERM GOAL #1   Title "Pt will be independent with advanced HEP.    Time 8   Period Weeks   Status On-going   PT LONG TERM GOAL #2   Title Right ankle strength improved to grossly 4/5 needed for standing/walking community distances.   Time 8   Period Weeks   Status On-going   PT LONG TERM GOAL #3   Title Patient will be able to walk 500 feet on level surfaces indepedently for household and short community distances.   Time 8   Period Weeks   Status On-going   PT LONG TERM GOAL #4   Title Patient will have right ankle DF of 3 degrees needed for greater ease ascending/descending steps at home and negotiating curbs.     Status Achieved   PT LONG TERM GOAL #5   Title Gait speed of at least .59m/sec needed for community ambulation with less chance of adverse events.    Time 8   Status  On-going   PT LONG TERM GOAL #6   Title Patient will report overall improvement in right ankle pain and left knee pain and return to usual home cooking/cleaning activities > 75%   Time 8   Period Weeks   Status On-going               Plan - 12/30/14 1723    PT Next Visit Plan Continue with progressive standing AROM, strengthening and proprioceptive exercises within pain limits; vasocompression right ankle, cold pack left knee as needed; patient requests to continue with progression on treadmill and leg press machines        Problem List Patient Active Problem List   Diagnosis Date Noted  . Ankle fracture, bimalleolar, closed 08/31/2014  . CKD (chronic kidney disease) stage 2, GFR 60-89 ml/min 08/31/2014  . Uncontrolled pain 08/31/2014  . Hypothyroidism 08/31/2014  . Nocturnal hypoxemia 03/28/2014    Alvera Singh 12/30/2014, 5:36 PM  [employer] [place], [place], [address] Phone: [phone]   Fax:  [phone]   Ruben Im, PT 12/30/2014 5:37 PM Phone: [phone] Fax: [phone]

## 2015-01-01 ENCOUNTER — Ambulatory Visit: Payer: Medicare Other | Admitting: Physical Therapy

## 2015-01-01 DIAGNOSIS — M25562 Pain in left knee: Secondary | ICD-10-CM | POA: Diagnosis not present

## 2015-01-01 DIAGNOSIS — E039 Hypothyroidism, unspecified: Secondary | ICD-10-CM | POA: Diagnosis not present

## 2015-01-01 DIAGNOSIS — Z9889 Other specified postprocedural states: Secondary | ICD-10-CM

## 2015-01-01 DIAGNOSIS — S82841D Displaced bimalleolar fracture of right lower leg, subsequent encounter for closed fracture with routine healing: Secondary | ICD-10-CM | POA: Diagnosis not present

## 2015-01-01 DIAGNOSIS — N182 Chronic kidney disease, stage 2 (mild): Secondary | ICD-10-CM | POA: Diagnosis not present

## 2015-01-01 DIAGNOSIS — I129 Hypertensive chronic kidney disease with stage 1 through stage 4 chronic kidney disease, or unspecified chronic kidney disease: Secondary | ICD-10-CM | POA: Diagnosis not present

## 2015-01-01 DIAGNOSIS — Z96659 Presence of unspecified artificial knee joint: Secondary | ICD-10-CM | POA: Diagnosis not present

## 2015-01-01 DIAGNOSIS — Z8781 Personal history of (healed) traumatic fracture: Secondary | ICD-10-CM

## 2015-01-01 NOTE — Therapy (Signed)
[place], [place], [address] Phone: [phone]   Fax:  [phone]  Physical Therapy Treatment  Patient Details  Name: Tammie [name] MRN: [id number] Date of Birth: Sep 03, 1941 Referring Provider:  [name], *  Encounter Date: 01/01/2015      PT End of Session - 01/01/15 1730    Visit Number 8   Number of Visits 16   Date for PT Re-Evaluation 01/29/15   PT Start Time 7893   PT Stop Time 1645   PT Time Calculation (min) 60 min   Activity Tolerance Patient tolerated treatment well      Past Medical History  Diagnosis Date  . HTN (hypertension)   . Renal cell cancer   . Glaucoma   . Hypothyroidism   . Colon adenoma   . Chronic kidney disease   . Depression     Past Surgical History  Procedure Laterality Date  . Total abdominal hysterectomy    . Kidney surgery    . Replacement total knee    . Joint replacement    . Orif ankle fracture Right 09/01/2014    Procedure: OPEN REDUCTION INTERNAL FIXATION (ORIF) ANKLE FRACTURE;  Surgeon: [name], MD;  Location: [facility];  Service: Orthopedics;  Laterality: Right;    There were no vitals taken for this visit.  Visit Diagnosis:  Left knee pain  Status post ORIF of fracture of ankle      Subjective Assessment - 01/01/15 1557    Symptoms Reports she didn't sleep well because [name] was in a lot of pain last night (he is under Hospice care.Marland Kitchen)  Denies excessive pain or soreness from previous visit.  Left knee pain is getting better.  States the wrong shoes cause a little pain in right ankle.     Pain Score 1    Pain Location Ankle   Pain Orientation Right                    [facility] Adult PT Treatment/Exercise - 01/01/15 1606    Knee/Hip Exercises: Aerobic   Stationary Bike Nu step, L5, 10 minutes   Tread Mill 1.0 mph 5 min   Knee/Hip Exercises: Machines for Strengthening   Cybex Leg Press 20# bilateral press  30x seat 6   Total Gym Leg Press bilateral 20# 30x; left only 20# 30x; right only 20# 30x   Knee/Hip Exercises: Standing   Forward Step Up --  10x right/left 4 inch step no UE support   Rocker Board 3 minutes   Other Standing Knee Exercises Tandem stand eyes closed    Knee/Hip Exercises: Seated   Other Seated Knee Exercises red band ankle DF, PF, INV,EV 30x each   Cryotherapy   Number Minutes Cryotherapy 15 Minutes   Cryotherapy Location Ankle;Knee   Type of Cryotherapy Ice pack;Other (comment)  vasocompression right ankle mod compression, coldest setting                  PT Short Term Goals - 01/01/15 1736    PT SHORT TERM GOAL #1   Title Gait speed improved to .65 m/sec indicating decreased incidence for adverse effects.   Baseline .53m/sec   Time 4   Period Weeks   Status On-going   PT SHORT TERM GOAL #2   Title Gait with cane (SPC or QC) 100 feet with CGA needed for very short household distances.     Status Achieved   PT SHORT  TERM GOAL #3   Title Right ankle dorsiflexion to 0 degrees needed for greater ease with going up and down steps at home.    Status Achieved   PT SHORT TERM GOAL #4   Title Left knee extension to 0 degrees, improved HS length needed for walking community distances with less knee pain   Status Achieved           PT Long Term Goals - 01/01/15 1737    PT LONG TERM GOAL #1   Title "Pt will be independent with advanced HEP.    Time 8   Period Weeks   Status On-going   PT LONG TERM GOAL #2   Title Right ankle strength improved to grossly 4/5 needed for standing/walking community distances.   Time 8   Period Weeks   Status On-going   PT LONG TERM GOAL #3   Title Patient will be able to walk 500 feet on level surfaces indepedently for household and short community distances.   Status Achieved   PT LONG TERM GOAL #4   Title Patient will have right ankle DF of 3 degrees needed for greater ease ascending/descending steps at home and  negotiating curbs.     Status Achieved   PT LONG TERM GOAL #5   Title Gait speed of at least .25m/sec needed for community ambulation with less chance of adverse events.    Time 8   Period Weeks   Status On-going   PT LONG TERM GOAL #6   Title Patient will report overall improvement in right ankle pain and left knee pain and return to usual home cooking/cleaning activities > 75%   Time 8   Period Weeks   Status On-going               Plan - 01/01/15 1731    Clinical Impression Statement Patient ambulating with QC with decreased right ankle DF but with overall improved gait speed.  Progressing with long term goals.  Patient highly motivated.   Able to withstand mild balance challenges with narrow base of support with visual assist but with eyes closed patient needs close supervision for safety.  Minimal complaint of pain in ankle or knee.     PT Next Visit Plan Recheck 69m walk and MMT ankle;  Continue with progressive standing AROM, strengthening and proprioceptive exercises within pain limits; vasocompression right ankle, cold pack left knee as needed; patient requests to continue with progression on treadmill and leg press machines        Problem List Patient Active Problem List   Diagnosis Date Noted  . Ankle fracture, bimalleolar, closed 08/31/2014  . CKD (chronic kidney disease) stage 2, GFR 60-89 ml/min 08/31/2014  . Uncontrolled pain 08/31/2014  . Hypothyroidism 08/31/2014  . Nocturnal hypoxemia 03/28/2014    Alvera Singh 01/01/2015, 5:39 PM  [facility] [address] [place], [place], [address] Phone: [phone]   Fax:  [phone]  Ruben Im, PT 01/01/2015 5:39 PM Phone: [phone] Fax: [phone]

## 2015-01-05 ENCOUNTER — Ambulatory Visit: Payer: Medicare Other | Admitting: Physical Therapy

## 2015-01-05 DIAGNOSIS — Z8781 Personal history of (healed) traumatic fracture: Secondary | ICD-10-CM

## 2015-01-05 DIAGNOSIS — Z9889 Other specified postprocedural states: Principal | ICD-10-CM

## 2015-01-05 DIAGNOSIS — E039 Hypothyroidism, unspecified: Secondary | ICD-10-CM | POA: Diagnosis not present

## 2015-01-05 DIAGNOSIS — S82841D Displaced bimalleolar fracture of right lower leg, subsequent encounter for closed fracture with routine healing: Secondary | ICD-10-CM | POA: Diagnosis not present

## 2015-01-05 DIAGNOSIS — I129 Hypertensive chronic kidney disease with stage 1 through stage 4 chronic kidney disease, or unspecified chronic kidney disease: Secondary | ICD-10-CM | POA: Diagnosis not present

## 2015-01-05 DIAGNOSIS — M25562 Pain in left knee: Secondary | ICD-10-CM | POA: Diagnosis not present

## 2015-01-05 DIAGNOSIS — Z96659 Presence of unspecified artificial knee joint: Secondary | ICD-10-CM | POA: Diagnosis not present

## 2015-01-05 DIAGNOSIS — N182 Chronic kidney disease, stage 2 (mild): Secondary | ICD-10-CM | POA: Diagnosis not present

## 2015-01-05 NOTE — Therapy (Signed)
[place], [place], [address] Phone: [phone]   Fax:  [phone]  Physical Therapy Treatment  Patient Details  Name: Tammie [name] MRN: [id number] Date of Birth: 19-Jun-1941 Referring Provider:  [name], *  Encounter Date: 01/05/2015      PT End of Session - 01/05/15 1655    Visit Number 9   Number of Visits 16   Date for PT Re-Evaluation 01/29/15   PT Start Time 1550   PT Stop Time 9485   PT Time Calculation (min) 60 min   Activity Tolerance Patient limited by pain;Patient tolerated treatment well      Past Medical History  Diagnosis Date  . HTN (hypertension)   . Renal cell cancer   . Glaucoma   . Hypothyroidism   . Colon adenoma   . Chronic kidney disease   . Depression     Past Surgical History  Procedure Laterality Date  . Total abdominal hysterectomy    . Kidney surgery    . Replacement total knee    . Joint replacement    . Orif ankle fracture Right 09/01/2014    Procedure: OPEN REDUCTION INTERNAL FIXATION (ORIF) ANKLE FRACTURE;  Surgeon: [name], MD;  Location: [facility];  Service: Orthopedics;  Laterality: Right;    There were no vitals taken for this visit.  Visit Diagnosis:  Status post ORIF of fracture of ankle      Subjective Assessment - 01/05/15 1639    Symptoms can't step down stairs due to not having enough motion, and pain.  Back pain limits static standing, better with sitting.,. Reports having scoliosis in back and collapsed vertabra  has been painful since she had to pick up her husband 2 times in one day  ankle OK except for stairs, swelling some today.                    [facility] Adult PT Treatment/Exercise - 01/05/15 0001    Knee/Hip Exercises: Standing   Forward Step Up Right;1 set;Hand Hold: 1;Step Height: 4"   Step Down Right;Step Height: 4";10 reps;Hand Hold: 1   Functional Squat Limitations 10   SLS --  Needs contact  unable to not use hands   SLS with Vectors pillow case slides close stand by, 1 hand on counter brief rests due to hip, back pain   Other Standing Knee Exercises sit to stand 10 reps cued for equal weightbearing.   Cryotherapy   Number Minutes Cryotherapy 13 Minutes   Cryotherapy Location --  foot/back   Type of Cryotherapy Other (comment)  cold pack, gameready    Manual Therapy   Manual Therapy --  mobilization with movement, helped some, fibular mobilizatio   Joint Mobilization --  taping fibular distal helped decrease pain   Ankle Exercises: Standing   SLS --  unable without hands, eyes open, closed   Rocker Board 1 minute  contact guard                  PT Short Term Goals - 01/05/15 1703    PT SHORT TERM GOAL #1   Title Gait speed improved to .65 m/sec indicating decreased incidence for adverse effects.   Time 4   Period Weeks   Status Unable to assess           PT Long Term Goals - 01/05/15 1704    PT LONG TERM GOAL #1   Title "Pt will  be independent with advanced HEP.    Time 8   Period Weeks   Status On-going   PT LONG TERM GOAL #2   Title Right ankle strength improved to grossly 4/5 needed for standing/walking community distances.   Time 8   Period Weeks   Status Unable to assess   PT LONG TERM GOAL #5   Title Gait speed of at least .10m/sec needed for community ambulation with less chance of adverse events.    Time 8   Period Weeks   Status Unable to assess   PT LONG TERM GOAL #6   Title Patient will report overall improvement in right ankle pain and left knee pain and return to usual home cooking/cleaning activities > 75%   Time 8   Period Weeks   Status Unable to assess               Plan - 01/05/15 1700    Clinical Impression Statement Pain of back limiting ankle rehab.  Shoes very unsupportive with gait. excessive supination.  tape fibula helpful    PT Next Visit Plan Recheck 51m walk and MMT ankle;  Continue with progressive  standing AROM, strengthening and proprioceptive exercises within pain limits; vasocompression right ankle, cold pack left knee as needed; patient requests to continue with progression on treadmill and leg press machines        Problem List Patient Active Problem List   Diagnosis Date Noted  . Ankle fracture, bimalleolar, closed 08/31/2014  . CKD (chronic kidney disease) stage 2, GFR 60-89 ml/min 08/31/2014  . Uncontrolled pain 08/31/2014  . Hypothyroidism 08/31/2014  . Nocturnal hypoxemia 03/28/2014   Melvenia Needles, PTA 01/05/2015 5:06 PM Phone: [phone] Fax: [phone]  [facility] 01/05/2015, 5:06 PM  [facility] [address] [place], [place], [address] Phone: [phone]   Fax:  [phone]

## 2015-01-05 NOTE — Patient Instructions (Signed)
Remove tape if irritates, must remove tomorrow

## 2015-01-08 ENCOUNTER — Ambulatory Visit: Payer: Medicare Other | Admitting: Physical Therapy

## 2015-01-08 DIAGNOSIS — Z96659 Presence of unspecified artificial knee joint: Secondary | ICD-10-CM | POA: Diagnosis not present

## 2015-01-08 DIAGNOSIS — E039 Hypothyroidism, unspecified: Secondary | ICD-10-CM | POA: Diagnosis not present

## 2015-01-08 DIAGNOSIS — Z8781 Personal history of (healed) traumatic fracture: Secondary | ICD-10-CM

## 2015-01-08 DIAGNOSIS — Z9889 Other specified postprocedural states: Principal | ICD-10-CM

## 2015-01-08 DIAGNOSIS — N182 Chronic kidney disease, stage 2 (mild): Secondary | ICD-10-CM | POA: Diagnosis not present

## 2015-01-08 DIAGNOSIS — M25562 Pain in left knee: Secondary | ICD-10-CM

## 2015-01-08 DIAGNOSIS — I129 Hypertensive chronic kidney disease with stage 1 through stage 4 chronic kidney disease, or unspecified chronic kidney disease: Secondary | ICD-10-CM | POA: Diagnosis not present

## 2015-01-08 DIAGNOSIS — S82841D Displaced bimalleolar fracture of right lower leg, subsequent encounter for closed fracture with routine healing: Secondary | ICD-10-CM | POA: Diagnosis not present

## 2015-01-08 NOTE — Therapy (Signed)
[place], [place], [address] Phone: [phone]   Fax:  [phone]  Physical Therapy Treatment  Patient Details  Name: Tammie [name] MRN: [id number] Date of Birth: Oct 15, 1941 Referring Provider:  [name], *  Encounter Date: 01/08/2015      PT End of Session - 01/08/15 1659    Visit Number 10   Number of Visits 16   Date for PT Re-Evaluation 01/29/15   PT Start Time 6789   PT Stop Time 3810   PT Time Calculation (min) 65 min   Activity Tolerance Patient limited by pain      Past Medical History  Diagnosis Date  . HTN (hypertension)   . Renal cell cancer   . Glaucoma   . Hypothyroidism   . Colon adenoma   . Chronic kidney disease   . Depression     Past Surgical History  Procedure Laterality Date  . Total abdominal hysterectomy    . Kidney surgery    . Replacement total knee    . Joint replacement    . Orif ankle fracture Right 09/01/2014    Procedure: OPEN REDUCTION INTERNAL FIXATION (ORIF) ANKLE FRACTURE;  Surgeon: [name], MD;  Location: [facility];  Service: Orthopedics;  Laterality: Right;    There were no vitals taken for this visit.  Visit Diagnosis:  Status post ORIF of fracture of ankle  Left knee pain      Subjective Assessment - 01/08/15 1559    Symptoms Patient complains of difficulty descending stairs because of her ankle.  Down the steps 8/10 pain.  States she can walk better but overall the pain is not better.   Taping helped a lot.   Ascending stairs is OK.  Presents in rocker bottom shoes, using SPC.  Knee is still hurting.     Currently in Pain? Yes   Pain Score 2    Pain Location Ankle   Pain Orientation Right   Pain Score 3   Pain Location Knee   Pain Orientation Left          [facility] PT Assessment - 01/08/15 1608    ROM / Strength   AROM / PROM / Strength Strength;AROM   AROM   Right Ankle Dorsiflexion 5   Right Ankle Plantar  Flexion 53   Right Ankle Inversion 27   Right Ankle Eversion 12   Strength   Right Ankle Dorsiflexion 4/5   Right Ankle Plantar Flexion 4/5   Right Ankle Inversion 3+/5   Right Ankle Eversion 3+/5   Ambulation/Gait   Gait velocity 10 m with SPC 16 sec .41m/sec                  [facility] Adult PT Treatment/Exercise - 01/08/15 1603    Knee/Hip Exercises: Aerobic   Stationary Bike Nu step, L5, 10 minutes   Tread Mill 1.0 mph 3 min   Knee/Hip Exercises: Standing   SLS with Vectors red band hip extension and flexion with 2 hand support 10x each   Other Standing Knee Exercises rocking with foot on mat table for ankle DF 12x   Cryotherapy   Number Minutes Cryotherapy 15 Minutes   Cryotherapy Location Ankle;Knee   Type of Cryotherapy --  vasocompression ankle mod compress 32 degrees                  PT Short Term Goals - 01/08/15 1606    PT SHORT TERM GOAL #  1   Title Gait speed improved to .65 m/sec indicating decreased incidence for adverse effects.   Time 4   Period Weeks   Status On-going   PT SHORT TERM GOAL #2   Title Gait with cane (SPC or QC) 100 feet with CGA needed for very short household distances.     Status Achieved   PT SHORT TERM GOAL #3   Title Right ankle dorsiflexion to 0 degrees needed for greater ease with going up and down steps at home.    Status Achieved           PT Long Term Goals - 2015-01-24 1605    PT LONG TERM GOAL #1   Title "Pt will be independent with advanced HEP.    Time 8   Period Weeks   Status On-going   PT LONG TERM GOAL #2   Title Right ankle strength improved to grossly 4/5 needed for standing/walking community distances.   Time 8   Period Weeks   Status On-going   PT LONG TERM GOAL #3   Title Patient will be able to walk 500 feet on level surfaces indepedently for household and short community distances.   Status Achieved   PT LONG TERM GOAL #4   Title Patient will have right ankle DF of 3 degrees needed for  greater ease ascending/descending steps at home and negotiating curbs.     Status Achieved   PT LONG TERM GOAL #5   Title Gait speed of at least .58m/sec needed for community ambulation with less chance of adverse events.    Time 8   Period Weeks   Status On-going   PT LONG TERM GOAL #6   Title Patient will report overall improvement in right ankle pain and left knee pain and return to usual home cooking/cleaning activities > 75%   Time 8   Period Weeks   Status On-going               Plan - 24-Jan-2015 1700    Clinical Impression Statement Patient reports pain is no better but patient is ambulating with a single point cane now (vs. walker) with increased gait speed, she has increased ankle ROM and strength.  Patient's knee pain and stress/strain of taking care of her husband (who is on home Hospice care) is slowing her overall progress.  She is progressing with short term and long term goals.  Therapist closely monitoring pain and modifying as needed.  Good pain relief with cryotherapy.     PT Next Visit Plan Modalities as needed for pain control;  Continue with progressive standing AROM, strengthening and proprioceptive exercises within pain limits; patient requests to continue with progression on treadmill and leg press machines          G-Codes - 01-24-15 1705    Functional Assessment Tool Used Clinical judgement; 10 m walk, TUG   Functional Limitation Mobility: Walking and moving around   Mobility: Walking and Moving Around Current Status [phone]) At least 60 percent but less than 80 percent impaired, limited or restricted   Mobility: Walking and Moving Around Goal Status [phone]) At least 40 percent but less than 60 percent impaired, limited or restricted      Problem List Patient Active Problem List   Diagnosis Date Noted  . Ankle fracture, bimalleolar, closed 08/31/2014  . CKD (chronic kidney disease) stage 2, GFR 60-89 ml/min 08/31/2014  . Uncontrolled pain 08/31/2014   . Hypothyroidism 08/31/2014  . Nocturnal hypoxemia 03/28/2014  Alvera Singh 01/08/2015, 5:08 PM  [facility] [address] [place], [place], [address] Phone: [phone]   Fax:  [phone]  Ruben Im, PT 01/08/2015 5:08 PM Phone: [phone] Fax: [phone]

## 2015-01-12 ENCOUNTER — Ambulatory Visit: Payer: Medicare Other | Admitting: Physical Therapy

## 2015-01-12 DIAGNOSIS — M25562 Pain in left knee: Secondary | ICD-10-CM | POA: Diagnosis not present

## 2015-01-12 DIAGNOSIS — Z96659 Presence of unspecified artificial knee joint: Secondary | ICD-10-CM | POA: Diagnosis not present

## 2015-01-12 DIAGNOSIS — Z8781 Personal history of (healed) traumatic fracture: Secondary | ICD-10-CM

## 2015-01-12 DIAGNOSIS — S82841D Displaced bimalleolar fracture of right lower leg, subsequent encounter for closed fracture with routine healing: Secondary | ICD-10-CM | POA: Diagnosis not present

## 2015-01-12 DIAGNOSIS — I129 Hypertensive chronic kidney disease with stage 1 through stage 4 chronic kidney disease, or unspecified chronic kidney disease: Secondary | ICD-10-CM | POA: Diagnosis not present

## 2015-01-12 DIAGNOSIS — Z9889 Other specified postprocedural states: Principal | ICD-10-CM

## 2015-01-12 DIAGNOSIS — E039 Hypothyroidism, unspecified: Secondary | ICD-10-CM | POA: Diagnosis not present

## 2015-01-12 DIAGNOSIS — N182 Chronic kidney disease, stage 2 (mild): Secondary | ICD-10-CM | POA: Diagnosis not present

## 2015-01-12 NOTE — Therapy (Signed)
[place], [place], [address] Phone: [phone]   Fax:  [phone]  Physical Therapy Treatment  Patient Details  Name: Tammie [name] MRN: [id number] Date of Birth: [birth year] Referring Provider:  [name], *  Encounter Date: 01/12/2015      PT End of Session - 01/12/15 1652    Visit Number 11   Number of Visits 16   Date for PT Re-Evaluation 01/29/15   PT Start Time 4481   PT Stop Time 1647   PT Time Calculation (min) 59 min   Activity Tolerance Patient tolerated treatment well      Past Medical History  Diagnosis Date  . HTN (hypertension)   . Renal cell cancer   . Glaucoma   . Hypothyroidism   . Colon adenoma   . Chronic kidney disease   . Depression     Past Surgical History  Procedure Laterality Date  . Total abdominal hysterectomy    . Kidney surgery    . Replacement total knee    . Joint replacement    . Orif ankle fracture Right 09/01/2014    Procedure: OPEN REDUCTION INTERNAL FIXATION (ORIF) ANKLE FRACTURE;  Surgeon: [name], MD;  Location: [facility] [facility];  Service: Orthopedics;  Laterality: Right;    There were no vitals taken for this visit.  Visit Diagnosis:  Status post ORIF of fracture of ankle  Left knee pain      Subjective Assessment - 01/12/15 1558    Symptoms Patient complains of difficulty descending stairs because of her ankle.  Down the steps 8/10 pain.  States she can walk better but overall the pain is not better.     Ascending stairs is OK.  Presents in rocker bottom shoes, using SPC.   She limits walking due to knee and back pain.  Knee is still hurting.     Pain Score --  mild   Pain Location Ankle   Pain Orientation Right   Pain Type --  burning, does not hurt in the am   Aggravating Factors  descending steps,   pushing on gas pedal, walking   Pain Relieving Factors cruise control on car, descending stairs, cold, using cane    Multiple Pain Sites Yes   Wong-Baker Pain Rating Hurts little more   Pain Location Knee   Pain Orientation Left   Pain Descriptors / Indicators --  pain   Pain Frequency Intermittent   Pain Onset On-going                    [facility] Adult PT Treatment/Exercise - 01/12/15 1605    Knee/Hip Exercises: Stretches   Passive Hamstring Stretch --  , Lt   Knee/Hip Exercises: Aerobic   Stationary Bike Nu step   Knee/Hip Exercises: Machines for Strengthening   Cybex Knee Extension --  small arc 2 plates 30  reps.   Cybex Knee Flexion 2 plates 30 reps   Knee/Hip Exercises: Seated   Other Seated Knee Exercises 10 reps sit to stand   Cryotherapy   Number Minutes Cryotherapy 15 Minutes   Cryotherapy Location --  foot, game ready, knee cold pack, hip RT cold pack   Manual Therapy   Manual Therapy --  retrograde soft tissue work Lt knee, medial hamstrings tende   Joint Mobilization --  2 fans of kinesiotex taping for knee edema   Ankle Exercises: Standing   Other Standing Ankle Exercises seated 4 way, green  band 10 reps each                  PT Short Term Goals - 01/12/15 1709    PT SHORT TERM GOAL #1   Status Unable to assess           PT Long Term Goals - 01/12/15 1709    PT LONG TERM GOAL #5   Status Unable to assess   PT LONG TERM GOAL #6   Title Patient will report overall improvement in right ankle pain and left knee pain and return to usual home cooking/cleaning activities > 75%   Time 8   Status On-going               Plan - 01/12/15 1654    Clinical Impression Statement strengthening  today.  Some of her knee pain is due to swelling.     PT Next Visit Plan Modalities as needed for pain control;  Continue with progressive standing AROM, strengthening and proprioceptive exercises within pain limits; patient requests to continue with progression on treadmill and leg press machines   Consulted and Agree with Plan of Care Patient         Problem List Patient Active Problem List   Diagnosis Date Noted  . Ankle fracture, bimalleolar, closed 08/31/2014  . CKD (chronic kidney disease) stage 2, GFR 60-89 ml/min 08/31/2014  . Uncontrolled pain 08/31/2014  . Hypothyroidism 08/31/2014  . Nocturnal hypoxemia 03/28/2014  Melvenia Needles, PTA 01/12/2015 5:12 PM Phone: [phone] Fax: [phone]   [facility] 01/12/2015, 5:12 PM  [facility] [address] [place], [place], [address] Phone: [phone]   Fax:  [phone]

## 2015-01-15 ENCOUNTER — Ambulatory Visit: Payer: Medicare Other | Attending: Orthopaedic Surgery | Admitting: Physical Therapy

## 2015-01-15 DIAGNOSIS — Z8781 Personal history of (healed) traumatic fracture: Secondary | ICD-10-CM

## 2015-01-15 DIAGNOSIS — E039 Hypothyroidism, unspecified: Secondary | ICD-10-CM | POA: Diagnosis not present

## 2015-01-15 DIAGNOSIS — S82841D Displaced bimalleolar fracture of right lower leg, subsequent encounter for closed fracture with routine healing: Secondary | ICD-10-CM | POA: Insufficient documentation

## 2015-01-15 DIAGNOSIS — I129 Hypertensive chronic kidney disease with stage 1 through stage 4 chronic kidney disease, or unspecified chronic kidney disease: Secondary | ICD-10-CM | POA: Diagnosis not present

## 2015-01-15 DIAGNOSIS — M25562 Pain in left knee: Secondary | ICD-10-CM | POA: Insufficient documentation

## 2015-01-15 DIAGNOSIS — N182 Chronic kidney disease, stage 2 (mild): Secondary | ICD-10-CM | POA: Insufficient documentation

## 2015-01-15 DIAGNOSIS — Z96659 Presence of unspecified artificial knee joint: Secondary | ICD-10-CM | POA: Insufficient documentation

## 2015-01-15 DIAGNOSIS — Z9889 Other specified postprocedural states: Secondary | ICD-10-CM

## 2015-01-15 NOTE — Therapy (Signed)
[place], [place], [address] Phone: [phone]   Fax:  [phone]  Physical Therapy Treatment  Patient Details  Name: Tammie [name] MRN: [id number] Date of Birth: [birth year] Referring Provider:  [name], *  Encounter Date: 01/15/2015      PT End of Session - 01/15/15 1747    Visit Number 12   Number of Visits 16   Date for PT Re-Evaluation 01/29/15   PT Start Time 1550   PT Stop Time 1640   PT Time Calculation (min) 50 min   Activity Tolerance Patient tolerated treatment well;Other (comment)  Patient limited by not feeling well      Past Medical History  Diagnosis Date  . HTN (hypertension)   . Renal cell cancer   . Glaucoma   . Hypothyroidism   . Colon adenoma   . Chronic kidney disease   . Depression     Past Surgical History  Procedure Laterality Date  . Total abdominal hysterectomy    . Kidney surgery    . Replacement total knee    . Joint replacement    . Orif ankle fracture Right 09/01/2014    Procedure: OPEN REDUCTION INTERNAL FIXATION (ORIF) ANKLE FRACTURE;  Surgeon: [name], MD;  Location: [place];  Service: Orthopedics;  Laterality: Right;    There were no vitals taken for this visit.  Visit Diagnosis:  Status post ORIF of fracture of ankle  Left knee pain      Subjective Assessment - 01/15/15 1601    Symptoms knee is better ,  foot/ankle is just a little sore..  Busy with her husband..  Does not feel well today, her daughter may have given me a cold.  Better with rest, cold   Currently in Pain? Yes  see above                    [facility] Adult PT Treatment/Exercise - 01/15/15 1611    Knee/Hip Exercises: Stretches   Gastroc Stretch 3 reps   Knee/Hip Exercises: Aerobic   Stationary Bike Nustep   Level 8 10 minutes + 30 seconds   Elliptical 1-2 minutes   Knee/Hip Exercises: Standing   Lateral Step Up 10 reps   Forward Step Up  Right;Left;20 reps;Hand Hold: 2;Step Height: 6"  Rt   Step Down --  Rt   Cryotherapy   Number Minutes Cryotherapy 15 Minutes   Cryotherapy Location --  Lt knee, cold pack, Rt foot Vasopneumatic   Manual Therapy   Manual Therapy --  strapping tape, to hold fibula posterior to help on the step   Ankle Exercises: Standing   Rocker Board 2 minutes   Ankle Exercises: Seated   Other Seated Ankle Exercises --  4 way 10 reps each, green band                  PT Short Term Goals - 01/15/15 1750    PT SHORT TERM GOAL #1   Status Unable to assess           PT Long Term Goals - 01/15/15 1750    PT LONG TERM GOAL #1   Status On-going   PT LONG TERM GOAL #2   Status Unable to assess   PT LONG TERM GOAL #5   Status Unable to assess   PT LONG TERM GOAL #6   Baseline Hard for patient to answer this question.   Status On-going  Plan - 01/15/15 1749    Clinical Impression Statement strengthening, low endurance oday, not feeling well,  pain improving knee and foot.   PT Next Visit Plan Modalities as needed for pain control;  Continue with progressive standing AROM, strengthening and proprioceptive exercises within pain limits; patient requests to continue with progression on treadmill and leg press machines        Problem List Patient Active Problem List   Diagnosis Date Noted  . Ankle fracture, bimalleolar, closed 08/31/2014  . CKD (chronic kidney disease) stage 2, GFR 60-89 ml/min 08/31/2014  . Uncontrolled pain 08/31/2014  . Hypothyroidism 08/31/2014  . Nocturnal hypoxemia 03/28/2014   Melvenia Needles, PTA 01/15/2015 6:15 PM Phone: [phone] Fax: [phone]   Melvenia Needles 01/15/2015, 6:15 PM  [facility] [address] [place], [place], [address] Phone: [phone]   Fax:  [phone]

## 2015-01-19 ENCOUNTER — Ambulatory Visit: Payer: Medicare Other | Admitting: Physical Therapy

## 2015-01-19 DIAGNOSIS — I129 Hypertensive chronic kidney disease with stage 1 through stage 4 chronic kidney disease, or unspecified chronic kidney disease: Secondary | ICD-10-CM | POA: Diagnosis not present

## 2015-01-19 DIAGNOSIS — N182 Chronic kidney disease, stage 2 (mild): Secondary | ICD-10-CM | POA: Diagnosis not present

## 2015-01-19 DIAGNOSIS — S82841D Displaced bimalleolar fracture of right lower leg, subsequent encounter for closed fracture with routine healing: Secondary | ICD-10-CM | POA: Diagnosis not present

## 2015-01-19 DIAGNOSIS — Z9889 Other specified postprocedural states: Principal | ICD-10-CM

## 2015-01-19 DIAGNOSIS — Z8781 Personal history of (healed) traumatic fracture: Secondary | ICD-10-CM

## 2015-01-19 DIAGNOSIS — M25562 Pain in left knee: Secondary | ICD-10-CM

## 2015-01-19 DIAGNOSIS — Z96659 Presence of unspecified artificial knee joint: Secondary | ICD-10-CM | POA: Diagnosis not present

## 2015-01-19 DIAGNOSIS — E039 Hypothyroidism, unspecified: Secondary | ICD-10-CM | POA: Diagnosis not present

## 2015-01-19 NOTE — Therapy (Signed)
[place], [place], [address] Phone: [phone]   Fax:  [phone]  Physical Therapy Treatment  Patient Details  Name: Tammie [name] MRN: [id number] Date of Birth: 1940/12/08 Referring Provider:  Carol Ada, MD  Encounter Date: 01/19/2015      PT End of Session - 01/19/15 1650    Visit Number 13   Number of Visits 16   Date for PT Re-Evaluation 01/29/15   PT Start Time 6962   PT Stop Time 1655   PT Time Calculation (min) 57 min   Activity Tolerance Patient tolerated treatment well;Patient limited by fatigue      Past Medical History  Diagnosis Date  . HTN (hypertension)   . Renal cell cancer   . Glaucoma   . Hypothyroidism   . Colon adenoma   . Chronic kidney disease   . Depression     Past Surgical History  Procedure Laterality Date  . Total abdominal hysterectomy    . Kidney surgery    . Replacement total knee    . Joint replacement    . Orif ankle fracture Right 09/01/2014    Procedure: OPEN REDUCTION INTERNAL FIXATION (ORIF) ANKLE FRACTURE;  Surgeon: [name], MD;  Location: [place];  Service: Orthopedics;  Laterality: Right;    There were no vitals taken for this visit.  Visit Diagnosis:  Status post ORIF of fracture of ankle  Left knee pain      Subjective Assessment - 01/19/15 1606    Symptoms Just a little.  rides her bike and was able to walk outside 10 minutes for exercise.  Has to watch husband 24/7.   Currently in Pain? Yes   Pain Score --  just a little   Pain Orientation Right   Wong-Baker Pain Rating Hurts little more   Pain Type Chronic pain   Pain Location Knee   Pain Orientation Left   Pain Radiating Towards none given today.   Pain Descriptors / Indicators Aching   Pain Frequency Constant                    [facility] Adult PT Treatment/Exercise - 01/19/15 0001    Knee/Hip Exercises: Aerobic   Elliptical 1= minutes  slow speed  part of time not recorded,     Tread Mill 5 imnutes 1.3 mph   Knee/Hip Exercises: Machines for Strengthening   Cybex Leg Press --  1,1.5,2,plates 10 reps each   Knee/Hip Exercises: Standing   Forward Step Up Right;Both;1 set;Hand Hold: 2;Step Height: 6"   Functional Squat 10 reps  sit to stand from mat   Knee/Hip Exercises: Seated   Other Seated Knee Exercises 30 reps, knee flexion green band   Cryotherapy   Number Minutes Cryotherapy 15 Minutes   Cryotherapy Location --  knee Lt, foot rt ,   Type of Cryotherapy --  game ready moderate, 32 degrees,foot RT,  Knee - cold pack   Ankle Exercises: Seated   Other Seated Ankle Exercises band 4 way 20 reps, green   Ankle Exercises: Standing   Rocker Board 2 minutes  use of hands for balance   Heel Raises 20 reps   Toe Raise 20 reps                  PT Short Term Goals - 01/19/15 1708    PT SHORT TERM GOAL #1   Title Gait speed improved to .65 m/sec indicating decreased incidence  for adverse effects.   Time 4   Period Weeks   Status On-going           PT Long Term Goals - 01/19/15 1709    PT LONG TERM GOAL #1   Title "Pt will be independent with advanced HEP.    Time 8   Period Weeks   Status On-going   PT LONG TERM GOAL #2   Title Right ankle strength improved to grossly 4/5 needed for standing/walking community distances.   Time 8   Period Weeks   Status On-going   PT LONG TERM GOAL #5   Title Gait speed of at least .87m/sec needed for community ambulation with less chance of adverse events.    Time 8   Period Weeks   Status On-going               Plan - 01/19/15 1707    PT Next Visit Plan Last visit next, Taping, ROM, answer any questions.   Timed up and go for gait speed. assessment.  Chech all goals.  LEFS or FOTO whichever needed        Problem List Patient Active Problem List   Diagnosis Date Noted  . Ankle fracture, bimalleolar, closed 08/31/2014  . CKD (chronic kidney disease) stage  2, GFR 60-89 ml/min 08/31/2014  . Uncontrolled pain 08/31/2014  . Hypothyroidism 08/31/2014  . Nocturnal hypoxemia 03/28/2014  Melvenia Needles, PTA 01/19/2015 5:11 PM Phone: [phone] Fax: [phone]   Melvenia Needles 01/19/2015, 5:11 PM  [facility] [address] [place], [place], [address] Phone: [phone]   Fax:  [phone]

## 2015-01-22 ENCOUNTER — Ambulatory Visit: Payer: Medicare Other | Admitting: Physical Therapy

## 2015-01-22 DIAGNOSIS — N182 Chronic kidney disease, stage 2 (mild): Secondary | ICD-10-CM | POA: Diagnosis not present

## 2015-01-22 DIAGNOSIS — I129 Hypertensive chronic kidney disease with stage 1 through stage 4 chronic kidney disease, or unspecified chronic kidney disease: Secondary | ICD-10-CM | POA: Diagnosis not present

## 2015-01-22 DIAGNOSIS — S82841D Displaced bimalleolar fracture of right lower leg, subsequent encounter for closed fracture with routine healing: Secondary | ICD-10-CM | POA: Diagnosis not present

## 2015-01-22 DIAGNOSIS — M25562 Pain in left knee: Secondary | ICD-10-CM

## 2015-01-22 DIAGNOSIS — Z96659 Presence of unspecified artificial knee joint: Secondary | ICD-10-CM | POA: Diagnosis not present

## 2015-01-22 DIAGNOSIS — Z8781 Personal history of (healed) traumatic fracture: Secondary | ICD-10-CM

## 2015-01-22 DIAGNOSIS — E039 Hypothyroidism, unspecified: Secondary | ICD-10-CM | POA: Diagnosis not present

## 2015-01-22 DIAGNOSIS — Z9889 Other specified postprocedural states: Secondary | ICD-10-CM

## 2015-01-22 NOTE — Therapy (Addendum)
[place], [place], [address] Phone: [phone]   Fax:  [phone]  Physical Therapy Treatment/Discharge summary  Patient Details  Name: KELAIAH ESCALONA MRN: [id number] Date of Birth: 10/19/1941 Referring Provider:  Carol Ada, MD  Encounter Date: 01/22/2015    Past Medical History  Diagnosis Date  . HTN (hypertension)   . Renal cell cancer   . Glaucoma   . Hypothyroidism   . Colon adenoma   . Chronic kidney disease   . Depression     Past Surgical History  Procedure Laterality Date  . Total abdominal hysterectomy    . Kidney surgery    . Replacement total knee    . Joint replacement    . Orif ankle fracture Right 09/01/2014    Procedure: OPEN REDUCTION INTERNAL FIXATION (ORIF) ANKLE FRACTURE;  Surgeon: [name], MD;  Location: [place];  Service: Orthopedics;  Laterality: Right;    There were no vitals filed for this visit.  Visit Diagnosis:  Left knee pain  Status post ORIF of fracture of ankle      Subjective Assessment - 01/22/15 1556    Symptoms Doing ok with walking.  Feels a little better today.   Currently in Pain? Yes   Pain Score --  a lttle   Pain Orientation Right   Pain Descriptors / Indicators --  pain                       [facility] Adult PT Treatment/Exercise - 01/22/15 1548    Knee/Hip Exercises: Aerobic   Stationary Bike Nustep 10 minutes   Elliptical 1.5 minutes   Tread Mill 5 minutes   Knee/Hip Exercises: Machines for Strengthening   Total Gym Leg Press 1 plate 40 minutes   Cryotherapy   Number Minutes Cryotherapy 15 Minutes   Cryotherapy Location --  foot, knee   Type of Cryotherapy --  vasopneumatic, cold pack   Ankle Exercises: Standing   SLS 7 seconds max   Heel Raises 20 reps   Toe Raise 20 reps   Heel Walk (Round Trip) --  heel to toe, cones walking  SBA                  PT Short Term Goals - 01/22/15 1719     PT SHORT TERM GOAL #1   Title Gait speed improved to .65 m/sec indicating decreased incidence for adverse effects.   Time 4   Period Weeks   Status Not Met   PT SHORT TERM GOAL #2   Title Gait with cane (SPC or QC) 100 feet with CGA needed for very short household distances.     Time 4   Period Weeks   Status Achieved   PT SHORT TERM GOAL #3   Title Right ankle dorsiflexion to 0 degrees needed for greater ease with going up and down steps at home.    Time 4   Status Achieved   PT SHORT TERM GOAL #4   Title Left knee extension to 0 degrees, improved HS length needed for walking community distances with less knee pain   Time 4   Period Weeks   Status Achieved           PT Long Term Goals - 01/22/15 1719    PT LONG TERM GOAL #1   Title "Pt will be independent with advanced HEP.    Time 8   Period Weeks  Status Achieved   PT LONG TERM GOAL #2   Title Right ankle strength improved to grossly 4/5 needed for standing/walking community distances.   Time 8   Period Weeks   Status Achieved   PT LONG TERM GOAL #3   Title Patient will be able to walk 500 feet on level surfaces indepedently for household and short community distances.   Time 8   Period Weeks   Status Achieved   PT LONG TERM GOAL #4   Title Patient will have right ankle DF of 3 degrees needed for greater ease ascending/descending steps at home and negotiating curbs.     Time 8   Period Weeks   Status Achieved   PT LONG TERM GOAL #5   Title Gait speed of at least .3msec needed for community ambulation with less chance of adverse events.    Time 8   Status Unable to assess   PT LONG TERM GOAL #6   Time 8   Period Weeks   Status Partially Met               Problem List Patient Active Problem List   Diagnosis Date Noted  . Ankle fracture, bimalleolar, closed 08/31/2014  . CKD (chronic kidney disease) stage 2, GFR 60-89 ml/min 08/31/2014  . Uncontrolled pain 08/31/2014  . Hypothyroidism  08/31/2014  . Nocturnal hypoxemia 03/28/2014  KMelvenia Needles PTA 01/22/2015 5:21 PM Phone: [phone]: [phone]  [facility]/2015, 5:21 PM  [facility] [address]Phone: [phone]  Fax:  [phone]    PHYSICAL THERAPY DISCHARGE SUMMARY  Visits from Start of Care: 14  Current functional level related to goals / functional outcomes: Majority of Goals met   Remaining deficits: Patient states she is ready for discharge secondary to her husband is on Hospice care and she is unable to leave him alone.  Difficulty with prolonged walking should improve over time and as she continues with HEP for further ROM/strengthening   Education / Equipment: HEP Plan: Patient agrees to discharge.  Patient goals were partially met. Patient is being discharged due to the patient's request.  ?????  SRuben Im PT 01/22/2015 5:44 PM Phone: [phone]  Discharge G codes:  Mobility Goal 40-60 CK Discharge CK    Fax: [phone]

## 2015-02-10 DIAGNOSIS — M25511 Pain in right shoulder: Secondary | ICD-10-CM | POA: Diagnosis not present

## 2015-02-20 DIAGNOSIS — F5101 Primary insomnia: Secondary | ICD-10-CM | POA: Diagnosis not present

## 2015-02-20 DIAGNOSIS — F329 Major depressive disorder, single episode, unspecified: Secondary | ICD-10-CM | POA: Diagnosis not present

## 2015-02-20 DIAGNOSIS — E039 Hypothyroidism, unspecified: Secondary | ICD-10-CM | POA: Diagnosis not present

## 2015-02-20 DIAGNOSIS — N183 Chronic kidney disease, stage 3 (moderate): Secondary | ICD-10-CM | POA: Diagnosis not present

## 2015-02-20 DIAGNOSIS — F419 Anxiety disorder, unspecified: Secondary | ICD-10-CM | POA: Diagnosis not present

## 2015-02-20 DIAGNOSIS — M25511 Pain in right shoulder: Secondary | ICD-10-CM | POA: Diagnosis not present

## 2015-02-20 DIAGNOSIS — I10 Essential (primary) hypertension: Secondary | ICD-10-CM | POA: Diagnosis not present

## 2015-02-27 DIAGNOSIS — M810 Age-related osteoporosis without current pathological fracture: Secondary | ICD-10-CM | POA: Diagnosis not present

## 2015-02-27 DIAGNOSIS — Z1231 Encounter for screening mammogram for malignant neoplasm of breast: Secondary | ICD-10-CM | POA: Diagnosis not present

## 2015-03-04 DIAGNOSIS — M7581 Other shoulder lesions, right shoulder: Secondary | ICD-10-CM | POA: Diagnosis not present

## 2015-03-09 DIAGNOSIS — M545 Low back pain: Secondary | ICD-10-CM | POA: Diagnosis not present

## 2015-03-26 DIAGNOSIS — K2270 Barrett's esophagus without dysplasia: Secondary | ICD-10-CM | POA: Diagnosis not present

## 2015-03-26 DIAGNOSIS — K219 Gastro-esophageal reflux disease without esophagitis: Secondary | ICD-10-CM | POA: Diagnosis not present

## 2015-03-26 DIAGNOSIS — M810 Age-related osteoporosis without current pathological fracture: Secondary | ICD-10-CM | POA: Diagnosis not present

## 2015-04-06 DIAGNOSIS — M810 Age-related osteoporosis without current pathological fracture: Secondary | ICD-10-CM | POA: Diagnosis not present

## 2015-04-08 ENCOUNTER — Ambulatory Visit (INDEPENDENT_AMBULATORY_CARE_PROVIDER_SITE_OTHER): Payer: Medicare Other | Admitting: Pulmonary Disease

## 2015-04-08 ENCOUNTER — Encounter: Payer: Self-pay | Admitting: Pulmonary Disease

## 2015-04-08 VITALS — BP 130/74 | HR 70 | Temp 98.0°F | Ht <= 58 in | Wt 184.8 lb

## 2015-04-08 DIAGNOSIS — G4734 Idiopathic sleep related nonobstructive alveolar hypoventilation: Secondary | ICD-10-CM

## 2015-04-08 NOTE — Progress Notes (Signed)
   Subjective:    Patient ID: Tammie [name], female    DOB: 1941-06-17, 74 y.o.   MRN: [id number]  HPI The patient comes in today for follow-up of her known nocturnal hypoxemia, felt secondary to hypoaeration from centripetal obesity. She has been wearing oxygen nocturnally, and felt that she was sleeping well until her husband died in March 08, 2023 of this year. Since that time, she has had difficulties with her sleep, and also restlessness. She has found it very difficult to discipline herself regarding her diet, and her weight is actually increased 2 pounds since the last visit.   Review of Systems  Constitutional: Negative for fever and unexpected weight change.  HENT: Negative for congestion, dental problem, ear pain, nosebleeds, postnasal drip, rhinorrhea, sinus pressure, sneezing, sore throat and trouble swallowing.   Eyes: Negative for redness and itching.  Respiratory: Negative for cough, chest tightness, shortness of breath and wheezing.   Cardiovascular: Negative for palpitations and leg swelling.  Gastrointestinal: Negative for nausea and vomiting.  Genitourinary: Negative for dysuria.  Musculoskeletal: Negative for joint swelling.  Skin: Negative for rash.  Neurological: Negative for headaches.  Hematological: Does not bruise/bleed easily.  Psychiatric/Behavioral: Negative for dysphoric mood. The patient is not nervous/anxious.        Objective:   Physical Exam Obese female in no acute distress Nose without purulence or discharge noted Neck without lymphadenopathy or thyromegaly Lower extremities with mild edema, no cyanosis Alert and oriented, moves all 4 extremities.       Assessment & Plan:

## 2015-04-08 NOTE — Patient Instructions (Signed)
Continue on oxygen at night while sleeping. Keep working on weight reduction, and would recommend starting on some type of exercise program.  followup with Dr. Halford Chessman in one year

## 2015-04-08 NOTE — Assessment & Plan Note (Signed)
The patient has nocturnal hypoxemia that is felt secondary to centripetal obesity with hypoaeration. There is nothing by her history her lab work to suggest obesity hypoventilation syndrome, and her current sleep disruption did not start until her husband died this past 02-25-2023. She does not have sleep apnea by prior studies, nor does she have significant lung disease. I've asked her to continue on her nocturnal oxygen, and to work aggressively on weight loss.

## 2015-04-28 DIAGNOSIS — H4040X0 Glaucoma secondary to eye inflammation, unspecified eye, stage unspecified: Secondary | ICD-10-CM | POA: Diagnosis not present

## 2015-08-09 DIAGNOSIS — R05 Cough: Secondary | ICD-10-CM | POA: Diagnosis not present

## 2015-08-09 DIAGNOSIS — J189 Pneumonia, unspecified organism: Secondary | ICD-10-CM | POA: Diagnosis not present

## 2015-08-09 DIAGNOSIS — R0600 Dyspnea, unspecified: Secondary | ICD-10-CM | POA: Diagnosis not present

## 2015-08-09 DIAGNOSIS — R030 Elevated blood-pressure reading, without diagnosis of hypertension: Secondary | ICD-10-CM | POA: Diagnosis not present

## 2015-08-11 DIAGNOSIS — J209 Acute bronchitis, unspecified: Secondary | ICD-10-CM | POA: Diagnosis not present

## 2015-08-11 DIAGNOSIS — N183 Chronic kidney disease, stage 3 (moderate): Secondary | ICD-10-CM | POA: Diagnosis not present

## 2015-09-23 DIAGNOSIS — K635 Polyp of colon: Secondary | ICD-10-CM | POA: Diagnosis not present

## 2015-09-23 DIAGNOSIS — D124 Benign neoplasm of descending colon: Secondary | ICD-10-CM | POA: Diagnosis not present

## 2015-09-23 DIAGNOSIS — Z1211 Encounter for screening for malignant neoplasm of colon: Secondary | ICD-10-CM | POA: Diagnosis not present

## 2015-09-23 DIAGNOSIS — D123 Benign neoplasm of transverse colon: Secondary | ICD-10-CM | POA: Diagnosis not present

## 2015-09-23 DIAGNOSIS — Z86010 Personal history of colonic polyps: Secondary | ICD-10-CM | POA: Diagnosis not present

## 2015-10-13 DIAGNOSIS — Z23 Encounter for immunization: Secondary | ICD-10-CM | POA: Diagnosis not present

## 2015-10-13 DIAGNOSIS — R739 Hyperglycemia, unspecified: Secondary | ICD-10-CM | POA: Diagnosis not present

## 2015-10-13 DIAGNOSIS — M810 Age-related osteoporosis without current pathological fracture: Secondary | ICD-10-CM | POA: Diagnosis not present

## 2015-10-27 DIAGNOSIS — L304 Erythema intertrigo: Secondary | ICD-10-CM | POA: Diagnosis not present

## 2015-10-27 DIAGNOSIS — L821 Other seborrheic keratosis: Secondary | ICD-10-CM | POA: Diagnosis not present

## 2015-10-28 DIAGNOSIS — R7309 Other abnormal glucose: Secondary | ICD-10-CM | POA: Diagnosis not present

## 2015-10-28 DIAGNOSIS — I10 Essential (primary) hypertension: Secondary | ICD-10-CM | POA: Diagnosis not present

## 2015-10-28 DIAGNOSIS — F329 Major depressive disorder, single episode, unspecified: Secondary | ICD-10-CM | POA: Diagnosis not present

## 2015-10-28 DIAGNOSIS — E039 Hypothyroidism, unspecified: Secondary | ICD-10-CM | POA: Diagnosis not present

## 2015-10-28 DIAGNOSIS — N183 Chronic kidney disease, stage 3 (moderate): Secondary | ICD-10-CM | POA: Diagnosis not present

## 2015-10-28 DIAGNOSIS — R0982 Postnasal drip: Secondary | ICD-10-CM | POA: Diagnosis not present

## 2015-10-28 DIAGNOSIS — E7800 Pure hypercholesterolemia, unspecified: Secondary | ICD-10-CM | POA: Diagnosis not present

## 2015-10-28 DIAGNOSIS — R7303 Prediabetes: Secondary | ICD-10-CM | POA: Diagnosis not present

## 2015-11-17 DIAGNOSIS — J309 Allergic rhinitis, unspecified: Secondary | ICD-10-CM | POA: Diagnosis not present

## 2015-11-17 DIAGNOSIS — H9190 Unspecified hearing loss, unspecified ear: Secondary | ICD-10-CM | POA: Diagnosis not present

## 2015-11-17 DIAGNOSIS — K219 Gastro-esophageal reflux disease without esophagitis: Secondary | ICD-10-CM | POA: Diagnosis not present

## 2015-11-17 DIAGNOSIS — Z79899 Other long term (current) drug therapy: Secondary | ICD-10-CM | POA: Diagnosis not present

## 2015-11-17 DIAGNOSIS — R0982 Postnasal drip: Secondary | ICD-10-CM | POA: Diagnosis not present

## 2016-02-18 DIAGNOSIS — N182 Chronic kidney disease, stage 2 (mild): Secondary | ICD-10-CM | POA: Diagnosis not present

## 2016-02-18 DIAGNOSIS — Z905 Acquired absence of kidney: Secondary | ICD-10-CM | POA: Insufficient documentation

## 2016-02-18 DIAGNOSIS — I10 Essential (primary) hypertension: Secondary | ICD-10-CM | POA: Diagnosis not present

## 2016-03-25 ENCOUNTER — Encounter (HOSPITAL_COMMUNITY): Payer: Self-pay | Admitting: Emergency Medicine

## 2016-03-25 ENCOUNTER — Ambulatory Visit (HOSPITAL_COMMUNITY)
Admission: EM | Admit: 2016-03-25 | Discharge: 2016-03-25 | Disposition: A | Discharge status: Home / Self Care | Payer: Medicare Other | Attending: Family Medicine | Admitting: Family Medicine

## 2016-03-25 DIAGNOSIS — G8929 Other chronic pain: Secondary | ICD-10-CM | POA: Diagnosis not present

## 2016-03-25 DIAGNOSIS — M545 Low back pain: Secondary | ICD-10-CM | POA: Diagnosis not present

## 2016-03-25 DIAGNOSIS — M791 Myalgia, unspecified site: Secondary | ICD-10-CM

## 2016-03-25 DIAGNOSIS — T148 Other injury of unspecified body region: Secondary | ICD-10-CM

## 2016-03-25 DIAGNOSIS — T148XXA Other injury of unspecified body region, initial encounter: Secondary | ICD-10-CM

## 2016-03-25 MED ORDER — OXYCODONE-ACETAMINOPHEN 5-325 MG PO TABS: ORAL_TABLET | ORAL | Status: DC
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
## 2016-03-25 NOTE — ED Notes (Signed)
Pt c/o back pain onset 3-4 days... Reports pain radiates to right side hip and down right leg Pain increases w/activity... Missed appt w/PCP this am Denies inj/trauma A&O x4... No acute distress

## 2016-03-25 NOTE — Discharge Instructions (Signed)
Back Pain, Adult °Back pain is very common in adults. The cause of back pain is rarely dangerous and the pain often gets better over time. The cause of your back pain may not be known. Some common causes of back pain include: °· Strain of the muscles or ligaments supporting the spine. °· Wear and tear (degeneration) of the spinal disks. °· Arthritis. °· Direct injury to the back. °For many people, back pain may return. Since back pain is rarely dangerous, most people can learn to manage this condition on their own. °HOME CARE INSTRUCTIONS °Watch your back pain for any changes. The following actions may help to lessen any discomfort you are feeling: °· Remain active. It is stressful on your back to sit or stand in one place for long periods of time. Do not sit, drive, or stand in one place for more than 30 minutes at a time. Take short walks on even surfaces as soon as you are able. Try to increase the length of time you walk each day. °· Exercise regularly as directed by your health care provider. Exercise helps your back heal faster. It also helps avoid future injury by keeping your muscles strong and flexible. °· Do not stay in bed. Resting more than 1-2 days can delay your recovery. °· Pay attention to your body when you bend and lift. The most comfortable positions are those that put less stress on your recovering back. Always use proper lifting techniques, including: °¨ Bending your knees. °¨ Keeping the load close to your body. °¨ Avoiding twisting. °· Find a comfortable position to sleep. Use a firm mattress and lie on your side with your knees slightly bent. If you lie on your back, put a pillow under your knees. °· Avoid feeling anxious or stressed. Stress increases muscle tension and can worsen back pain. It is important to recognize when you are anxious or stressed and learn ways to manage it, such as with exercise. °· Take medicines only as directed by your health care provider. Over-the-counter  medicines to reduce pain and inflammation are often the most helpful. Your health care provider may prescribe muscle relaxant drugs. These medicines help dull your pain so you can more quickly return to your normal activities and healthy exercise. °· Apply ice to the injured area: °¨ Put ice in a plastic bag. °¨ Place a towel between your skin and the bag. °¨ Leave the ice on for 20 minutes, 2-3 times a day for the first 2-3 days. After that, ice and heat may be alternated to reduce pain and spasms. °· Maintain a healthy weight. Excess weight puts extra stress on your back and makes it difficult to maintain good posture. °SEEK MEDICAL CARE IF: °· You have pain that is not relieved with rest or medicine. °· You have increasing pain going down into the legs or buttocks. °· You have pain that does not improve in one week. °· You have night pain. °· You lose weight. °· You have a fever or chills. °SEEK IMMEDIATE MEDICAL CARE IF:  °· You develop new bowel or bladder control problems. °· You have unusual weakness or numbness in your arms or legs. °· You develop nausea or vomiting. °· You develop abdominal pain. °· You feel faint. °  °This information is not intended to replace advice given to you by your health care provider. Make sure you discuss any questions you have with your health care provider. °  °Document Released: 10/31/2005 Document Revised: 11/21/2014 Document Reviewed: 03/04/2014 °Elsevier Interactive Patient Education ©2016 Elsevier   Inc.  Chronic Pain Chronic pain can be defined as pain that is off and on and lasts for 3-6 months or longer. Many things cause chronic pain, which can make it difficult to make a diagnosis. There are many treatment options available for chronic pain. However, finding a treatment that works well for you may require trying various approaches until the right one is found. Many people benefit from a combination of two or more types of treatment to control their pain. SYMPTOMS    Chronic pain can occur anywhere in the body and can range from mild to very severe. Some types of chronic pain include:  Headache.  Low back pain.  Cancer pain.  Arthritis pain.  Neurogenic pain. This is pain resulting from damage to nerves. People with chronic pain may also have other symptoms such as:  Depression.  Anger.  Insomnia.  Anxiety. DIAGNOSIS  Your health care provider will help diagnose your condition over time. In many cases, the initial focus will be on excluding possible conditions that could be causing the pain. Depending on your symptoms, your health care provider may order tests to diagnose your condition. Some of these tests may include:   Blood tests.   CT scan.   MRI.   X-rays.   Ultrasounds.   Nerve conduction studies.  You may need to see a specialist.  TREATMENT  Finding treatment that works well may take time. You may be referred to a pain specialist. He or she may prescribe medicine or therapies, such as:   Mindful meditation or yoga.  Shots (injections) of numbing or pain-relieving medicines into the spine or area of pain.  Local electrical stimulation.  Acupuncture.   Massage therapy.   Aroma, color, light, or sound therapy.   Biofeedback.   Working with a physical therapist to keep from getting stiff.   Regular, gentle exercise.   Cognitive or behavioral therapy.   Group support.  Sometimes, surgery may be recommended.  HOME CARE INSTRUCTIONS   Take all medicines as directed by your health care provider.   Lessen stress in your life by relaxing and doing things such as listening to calming music.   Exercise or be active as directed by your health care provider.   Eat a healthy diet and include things such as vegetables, fruits, fish, and lean meats in your diet.   Keep all follow-up appointments with your health care provider.   Attend a support group with others suffering from chronic  pain. SEEK MEDICAL CARE IF:   Your pain gets worse.   You develop a new pain that was not there before.   You cannot tolerate medicines given to you by your health care provider.   You have new symptoms since your last visit with your health care provider.  SEEK IMMEDIATE MEDICAL CARE IF:   You feel weak.   You have decreased sensation or numbness.   You lose control of bowel or bladder function.   Your pain suddenly gets much worse.   You develop shaking.  You develop chills.  You develop confusion.  You develop chest pain.  You develop shortness of breath.  MAKE SURE YOU:  Understand these instructions.  Will watch your condition.  Will get help right away if you are not doing well or get worse.   This information is not intended to replace advice given to you by your health care provider. Make sure you discuss any questions you have with your health care provider.  Document Released: 07/23/2002 Document Revised: 07/03/2013 Document Reviewed: 04/26/2013 Elsevier Interactive Patient Education 2016 Elsevier Inc.  Muscle Pain, Adult Muscle pain (myalgia) may be caused by many things, including:  Overuse or muscle strain, especially if you are not in shape. This is the most common cause of muscle pain.  Injury.  Bruises.  Viruses, such as the flu.  Infectious diseases.  Fibromyalgia, which is a chronic condition that causes muscle tenderness, fatigue, and headache.  Autoimmune diseases, including lupus.  Certain drugs, including ACE inhibitors and statins. Muscle pain may be mild or severe. In most cases, the pain lasts only a short time and goes away without treatment. To diagnose the cause of your muscle pain, your health care provider will take your medical history. This means he or she will ask you when your muscle pain began and what has been happening. If you have not had muscle pain for very long, your health care provider may want to wait  before doing much testing. If your muscle pain has lasted a long time, your health care provider may want to run tests right away. If your health care provider thinks your muscle pain may be caused by illness, you may need to have additional tests to rule out certain conditions.  Treatment for muscle pain depends on the cause. Home care is often enough to relieve muscle pain. Your health care provider may also prescribe anti-inflammatory medicine. HOME CARE INSTRUCTIONS Watch your condition for any changes. The following actions may help to lessen any discomfort you are feeling:  Only take over-the-counter or prescription medicines as directed by your health care provider.  Apply ice to the sore muscle:  Put ice in a plastic bag.  Place a towel between your skin and the bag.  Leave the ice on for 15-20 minutes, 3-4 times a day.  You may alternate applying hot and cold packs to the muscle as directed by your health care provider.  If overuse is causing your muscle pain, slow down your activities until the pain goes away.  Remember that it is normal to feel some muscle pain after starting a workout program. Muscles that have not been used often will be sore at first.  Do regular, gentle exercises if you are not usually active.  Warm up before exercising to lower your risk of muscle pain.  Do not continue working out if the pain is very bad. Bad pain could mean you have injured a muscle. SEEK MEDICAL CARE IF:  Your muscle pain gets worse, and medicines do not help.  You have muscle pain that lasts longer than 3 days.  You have a rash or fever along with muscle pain.  You have muscle pain after a tick bite.  You have muscle pain while working out, even though you are in good physical condition.  You have redness, soreness, or swelling along with muscle pain.  You have muscle pain after starting a new medicine or changing the dose of a medicine. SEEK IMMEDIATE MEDICAL CARE  IF:  You have trouble breathing.  You have trouble swallowing.  You have muscle pain along with a stiff neck, fever, and vomiting.  You have severe muscle weakness or cannot move part of your body. MAKE SURE YOU:   Understand these instructions.  Will watch your condition.  Will get help right away if you are not doing well or get worse.   This information is not intended to replace advice given to you by your health care  provider. Make sure you discuss any questions you have with your health care provider.   Document Released: 09/22/2006 Document Revised: 11/21/2014 Document Reviewed: 08/27/2013 Elsevier Interactive Patient Education [employer].

## 2016-03-28 DIAGNOSIS — M25551 Pain in right hip: Secondary | ICD-10-CM | POA: Diagnosis not present

## 2016-03-28 DIAGNOSIS — M545 Low back pain: Secondary | ICD-10-CM | POA: Diagnosis not present

## 2016-03-30 DIAGNOSIS — M5416 Radiculopathy, lumbar region: Secondary | ICD-10-CM | POA: Diagnosis not present

## 2016-03-30 DIAGNOSIS — Z1231 Encounter for screening mammogram for malignant neoplasm of breast: Secondary | ICD-10-CM | POA: Diagnosis not present

## 2016-03-30 DIAGNOSIS — S46911A Strain of unspecified muscle, fascia and tendon at shoulder and upper arm level, right arm, initial encounter: Secondary | ICD-10-CM | POA: Diagnosis not present

## 2016-04-01 DIAGNOSIS — R928 Other abnormal and inconclusive findings on diagnostic imaging of breast: Secondary | ICD-10-CM | POA: Diagnosis not present

## 2016-04-15 DIAGNOSIS — M810 Age-related osteoporosis without current pathological fracture: Secondary | ICD-10-CM | POA: Diagnosis not present

## 2016-04-18 ENCOUNTER — Ambulatory Visit (INDEPENDENT_AMBULATORY_CARE_PROVIDER_SITE_OTHER): Payer: Medicare Other | Admitting: Pulmonary Disease

## 2016-04-18 ENCOUNTER — Encounter: Payer: Self-pay | Admitting: Pulmonary Disease

## 2016-04-18 VITALS — BP 138/72 | HR 71 | Ht 59.0 in | Wt 175.2 lb

## 2016-04-18 DIAGNOSIS — F411 Generalized anxiety disorder: Secondary | ICD-10-CM | POA: Diagnosis not present

## 2016-04-18 DIAGNOSIS — G4734 Idiopathic sleep related nonobstructive alveolar hypoventilation: Secondary | ICD-10-CM

## 2016-04-18 NOTE — Patient Instructions (Signed)
  Recheck nocturnal oxygen level during sleep We discussed slow tapering off Xanax every 3 months

## 2016-04-18 NOTE — Assessment & Plan Note (Signed)
Suggested revisiting nocturnal desaturation with  ONO  on room air

## 2016-04-18 NOTE — Assessment & Plan Note (Signed)
We discussed slow tapering off Xanax every 3 months
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
## 2016-04-25 DIAGNOSIS — R0902 Hypoxemia: Secondary | ICD-10-CM | POA: Diagnosis not present

## 2016-04-26 ENCOUNTER — Telehealth: Payer: Self-pay | Admitting: Pulmonary Disease

## 2016-04-26 NOTE — Telephone Encounter (Signed)
lmtcb x1 for pt. 

## 2016-04-26 NOTE — Telephone Encounter (Signed)
ONO showed significant drop in oxygen level less than 88% Stay on oxygen

## 2016-04-28 NOTE — Telephone Encounter (Signed)
Spoke with Maudie Mercury at [facility], states that pt is refusing 02 delivery to pt's house.   lmtcb X1 for pt to inquire further

## 2016-04-28 NOTE — Telephone Encounter (Signed)
Patient notified of ONO results. Patient wants to know if she can get a portable oxygen tank to take to her daughters house she said that her concentrator is too heavy to carry with her. She is leaving on Sunday 05/01/16 and returning Tuesday 05/03/16 Luanne Bras at [facility] and asked her if they can provide her with a tank to use at night during her stay at her daughters house. Patient does not have anyone to help her get the concentrator into her car for the trip. [facility] spoke with Rexene Edison, NP and they discussed getting patient set up in [place], [place] for when she arrives. Awaiting call back from [place] at [facility].

## 2016-04-28 NOTE — Telephone Encounter (Signed)
[facility] [phone] they got this arranged but pt stated she doesn't want it delierved to her daughters house nothing do not bother her daughter because she works and has children

## 2016-05-03 DIAGNOSIS — H2512 Age-related nuclear cataract, left eye: Secondary | ICD-10-CM | POA: Diagnosis not present

## 2016-05-03 DIAGNOSIS — Z961 Presence of intraocular lens: Secondary | ICD-10-CM | POA: Diagnosis not present

## 2016-05-03 DIAGNOSIS — H401494 Capsular glaucoma with pseudoexfoliation of lens, unspecified eye, indeterminate stage: Secondary | ICD-10-CM | POA: Diagnosis not present

## 2016-05-09 ENCOUNTER — Encounter: Payer: Self-pay | Admitting: Pulmonary Disease

## 2016-05-16 DIAGNOSIS — Z9981 Dependence on supplemental oxygen: Secondary | ICD-10-CM | POA: Insufficient documentation

## 2016-05-23 DIAGNOSIS — H401122 Primary open-angle glaucoma, left eye, moderate stage: Secondary | ICD-10-CM | POA: Diagnosis not present

## 2016-05-23 DIAGNOSIS — H2512 Age-related nuclear cataract, left eye: Secondary | ICD-10-CM | POA: Diagnosis not present

## 2016-05-24 DIAGNOSIS — Z961 Presence of intraocular lens: Secondary | ICD-10-CM | POA: Insufficient documentation

## 2016-05-26 DIAGNOSIS — M5431 Sciatica, right side: Secondary | ICD-10-CM | POA: Diagnosis not present

## 2016-05-26 DIAGNOSIS — M25551 Pain in right hip: Secondary | ICD-10-CM | POA: Diagnosis not present

## 2016-05-26 DIAGNOSIS — M25562 Pain in left knee: Secondary | ICD-10-CM | POA: Diagnosis not present

## 2016-05-26 DIAGNOSIS — M4726 Other spondylosis with radiculopathy, lumbar region: Secondary | ICD-10-CM | POA: Diagnosis not present

## 2016-06-03 ENCOUNTER — Other Ambulatory Visit: Payer: Self-pay | Admitting: Neurosurgery

## 2016-06-03 ENCOUNTER — Other Ambulatory Visit: Payer: Self-pay | Admitting: Sports Medicine

## 2016-06-03 DIAGNOSIS — M48061 Spinal stenosis, lumbar region without neurogenic claudication: Secondary | ICD-10-CM

## 2016-06-03 DIAGNOSIS — M545 Low back pain, unspecified: Secondary | ICD-10-CM

## 2016-06-03 DIAGNOSIS — G8929 Other chronic pain: Secondary | ICD-10-CM

## 2016-06-03 DIAGNOSIS — M5137 Other intervertebral disc degeneration, lumbosacral region: Secondary | ICD-10-CM | POA: Diagnosis not present

## 2016-06-09 ENCOUNTER — Other Ambulatory Visit: Payer: Medicare Other

## 2016-06-14 ENCOUNTER — Ambulatory Visit
Admission: RE | Admit: 2016-06-14 | Discharge: 2016-06-14 | Disposition: A | Discharge status: Home / Self Care | Payer: Medicare Other | Source: Ambulatory Visit | Attending: Sports Medicine | Admitting: Sports Medicine

## 2016-06-14 DIAGNOSIS — M545 Low back pain, unspecified: Secondary | ICD-10-CM

## 2016-06-14 DIAGNOSIS — G8929 Other chronic pain: Secondary | ICD-10-CM

## 2016-06-14 DIAGNOSIS — M4806 Spinal stenosis, lumbar region: Secondary | ICD-10-CM | POA: Diagnosis not present

## 2016-06-14 DIAGNOSIS — M48061 Spinal stenosis, lumbar region without neurogenic claudication: Secondary | ICD-10-CM

## 2016-06-20 ENCOUNTER — Ambulatory Visit
Admission: RE | Admit: 2016-06-20 | Discharge: 2016-06-20 | Disposition: A | Discharge status: Home / Self Care | Payer: Medicare Other | Source: Ambulatory Visit | Attending: Neurosurgery | Admitting: Neurosurgery

## 2016-06-20 DIAGNOSIS — M5137 Other intervertebral disc degeneration, lumbosacral region: Secondary | ICD-10-CM

## 2016-06-20 DIAGNOSIS — M47817 Spondylosis without myelopathy or radiculopathy, lumbosacral region: Secondary | ICD-10-CM | POA: Diagnosis not present

## 2016-06-20 MED ORDER — METHYLPREDNISOLONE ACETATE 40 MG/ML INJ SUSP (RADIOLOG
120.0000 mg | Freq: Once | INTRAMUSCULAR | Status: AC
  Administered 2016-06-20: 120 mg via EPIDURAL

## 2016-06-20 MED ORDER — IOPAMIDOL (ISOVUE-M 200) INJECTION 41%
1.0000 mL | Freq: Once | INTRAMUSCULAR | Status: AC
  Administered 2016-06-20: 1 mL via EPIDURAL

## 2016-06-20 NOTE — Discharge Instructions (Signed)

## 2016-06-22 DIAGNOSIS — I10 Essential (primary) hypertension: Secondary | ICD-10-CM | POA: Diagnosis not present

## 2016-07-12 DIAGNOSIS — M4120 Other idiopathic scoliosis, site unspecified: Secondary | ICD-10-CM | POA: Diagnosis not present

## 2016-07-12 DIAGNOSIS — M5137 Other intervertebral disc degeneration, lumbosacral region: Secondary | ICD-10-CM | POA: Diagnosis not present

## 2016-07-13 DIAGNOSIS — I10 Essential (primary) hypertension: Secondary | ICD-10-CM | POA: Diagnosis not present

## 2016-07-14 DIAGNOSIS — M4726 Other spondylosis with radiculopathy, lumbar region: Secondary | ICD-10-CM | POA: Diagnosis not present

## 2016-07-14 DIAGNOSIS — M25551 Pain in right hip: Secondary | ICD-10-CM | POA: Diagnosis not present

## 2016-07-14 DIAGNOSIS — M25562 Pain in left knee: Secondary | ICD-10-CM | POA: Diagnosis not present

## 2016-07-20 DIAGNOSIS — I10 Essential (primary) hypertension: Secondary | ICD-10-CM | POA: Diagnosis not present

## 2016-07-20 DIAGNOSIS — Z23 Encounter for immunization: Secondary | ICD-10-CM | POA: Diagnosis not present

## 2016-07-29 DIAGNOSIS — H20012 Primary iridocyclitis, left eye: Secondary | ICD-10-CM | POA: Diagnosis not present

## 2016-08-02 DIAGNOSIS — H3589 Other specified retinal disorders: Secondary | ICD-10-CM | POA: Diagnosis not present

## 2016-09-06 DIAGNOSIS — Z961 Presence of intraocular lens: Secondary | ICD-10-CM | POA: Diagnosis not present

## 2016-09-30 ENCOUNTER — Telehealth: Payer: Self-pay | Admitting: Pulmonary Disease

## 2016-09-30 DIAGNOSIS — G4734 Idiopathic sleep related nonobstructive alveolar hypoventilation: Secondary | ICD-10-CM

## 2016-09-30 NOTE — Telephone Encounter (Signed)
lmtcb x1 for pt. 

## 2016-10-03 NOTE — Telephone Encounter (Signed)
Patient returning call - she can be reached at [phone] - pr

## 2016-10-03 NOTE — Telephone Encounter (Signed)
Pt on 2L O2 qhs, pt is requesting a portable O2 tank to take to her daughters to spend the night.  RA please advise if you are okay with ordering this. Thanks.

## 2016-10-03 NOTE — Telephone Encounter (Signed)
OK - pl see phone note from 6/13  She is only on noct O2

## 2016-10-03 NOTE — Telephone Encounter (Signed)
lmtcb x2 for pt. 

## 2016-10-04 NOTE — Telephone Encounter (Signed)
lmtcb x1 for pt. 

## 2016-10-05 NOTE — Telephone Encounter (Signed)
lmtcb x2 for pt. 

## 2016-10-07 NOTE — Telephone Encounter (Signed)
Called and spoke with pt and she stated that she needs a POC that she can handle with taking to her daughters house to spend the night when needed.  She stated that she cannot handle a large POC.  Will place the order for eval for the pt with [facility].

## 2016-11-10 DIAGNOSIS — I10 Essential (primary) hypertension: Secondary | ICD-10-CM | POA: Diagnosis not present

## 2016-11-10 DIAGNOSIS — N183 Chronic kidney disease, stage 3 (moderate): Secondary | ICD-10-CM | POA: Diagnosis not present

## 2016-11-10 DIAGNOSIS — M810 Age-related osteoporosis without current pathological fracture: Secondary | ICD-10-CM | POA: Diagnosis not present

## 2016-11-10 DIAGNOSIS — E7800 Pure hypercholesterolemia, unspecified: Secondary | ICD-10-CM | POA: Diagnosis not present

## 2016-11-10 DIAGNOSIS — E039 Hypothyroidism, unspecified: Secondary | ICD-10-CM | POA: Diagnosis not present

## 2016-12-27 DIAGNOSIS — G44209 Tension-type headache, unspecified, not intractable: Secondary | ICD-10-CM | POA: Diagnosis not present

## 2016-12-27 DIAGNOSIS — I10 Essential (primary) hypertension: Secondary | ICD-10-CM | POA: Diagnosis not present

## 2016-12-27 DIAGNOSIS — M62838 Other muscle spasm: Secondary | ICD-10-CM | POA: Diagnosis not present

## 2016-12-27 DIAGNOSIS — M545 Low back pain: Secondary | ICD-10-CM | POA: Diagnosis not present

## 2017-01-29 DIAGNOSIS — R51 Headache: Secondary | ICD-10-CM | POA: Diagnosis not present

## 2017-01-29 DIAGNOSIS — I10 Essential (primary) hypertension: Secondary | ICD-10-CM | POA: Diagnosis not present

## 2017-01-29 DIAGNOSIS — R42 Dizziness and giddiness: Secondary | ICD-10-CM | POA: Diagnosis not present

## 2017-01-29 DIAGNOSIS — R110 Nausea: Secondary | ICD-10-CM | POA: Diagnosis not present

## 2017-02-03 DIAGNOSIS — M545 Low back pain: Secondary | ICD-10-CM | POA: Diagnosis not present

## 2017-02-03 DIAGNOSIS — I10 Essential (primary) hypertension: Secondary | ICD-10-CM | POA: Diagnosis not present

## 2017-02-03 DIAGNOSIS — G8929 Other chronic pain: Secondary | ICD-10-CM | POA: Diagnosis not present

## 2017-02-03 DIAGNOSIS — N183 Chronic kidney disease, stage 3 (moderate): Secondary | ICD-10-CM | POA: Diagnosis not present

## 2017-02-06 DIAGNOSIS — I10 Essential (primary) hypertension: Secondary | ICD-10-CM | POA: Diagnosis not present

## 2017-02-06 DIAGNOSIS — N182 Chronic kidney disease, stage 2 (mild): Secondary | ICD-10-CM | POA: Diagnosis not present

## 2017-02-06 DIAGNOSIS — Z905 Acquired absence of kidney: Secondary | ICD-10-CM | POA: Diagnosis not present

## 2017-02-06 DIAGNOSIS — N200 Calculus of kidney: Secondary | ICD-10-CM | POA: Diagnosis not present

## 2017-02-06 DIAGNOSIS — C641 Malignant neoplasm of right kidney, except renal pelvis: Secondary | ICD-10-CM | POA: Diagnosis not present

## 2017-02-08 ENCOUNTER — Other Ambulatory Visit: Payer: Self-pay | Admitting: Family Medicine

## 2017-02-08 DIAGNOSIS — N182 Chronic kidney disease, stage 2 (mild): Secondary | ICD-10-CM

## 2017-02-14 ENCOUNTER — Ambulatory Visit
Admission: RE | Admit: 2017-02-14 | Discharge: 2017-02-14 | Disposition: A | Discharge status: Home / Self Care | Payer: Medicare Other | Source: Ambulatory Visit | Attending: Family Medicine | Admitting: Family Medicine

## 2017-02-14 DIAGNOSIS — N182 Chronic kidney disease, stage 2 (mild): Secondary | ICD-10-CM

## 2017-02-16 ENCOUNTER — Encounter: Payer: Self-pay | Admitting: Physician Assistant

## 2017-02-17 ENCOUNTER — Encounter: Payer: Self-pay | Admitting: Physician Assistant

## 2017-02-17 ENCOUNTER — Ambulatory Visit (INDEPENDENT_AMBULATORY_CARE_PROVIDER_SITE_OTHER): Payer: Medicare Other | Admitting: Physician Assistant

## 2017-02-17 ENCOUNTER — Encounter (INDEPENDENT_AMBULATORY_CARE_PROVIDER_SITE_OTHER): Payer: Self-pay

## 2017-02-17 VITALS — BP 124/68 | HR 58 | Ht <= 58 in | Wt 183.4 lb

## 2017-02-17 DIAGNOSIS — IMO0001 Reserved for inherently not codable concepts without codable children: Secondary | ICD-10-CM

## 2017-02-17 DIAGNOSIS — N183 Chronic kidney disease, stage 3 unspecified: Secondary | ICD-10-CM

## 2017-02-17 DIAGNOSIS — E6609 Other obesity due to excess calories: Secondary | ICD-10-CM

## 2017-02-17 DIAGNOSIS — R079 Chest pain, unspecified: Secondary | ICD-10-CM

## 2017-02-17 DIAGNOSIS — R0602 Shortness of breath: Secondary | ICD-10-CM | POA: Diagnosis not present

## 2017-02-17 DIAGNOSIS — Z6837 Body mass index (BMI) 37.0-37.9, adult: Secondary | ICD-10-CM | POA: Diagnosis not present

## 2017-02-17 DIAGNOSIS — I10 Essential (primary) hypertension: Secondary | ICD-10-CM

## 2017-02-17 NOTE — Progress Notes (Signed)
Cardiology Office Note    Date:  02/17/2017  ID:  LYRIQ FINERTY, DOB Apr 04, 1941, MRN [id number] PCP:  [name], MD  Cardiologist:  Dr. Marlou Porch remotely   Chief Complaint: chest pain  History of Present Illness:  Tammie [name] is a 76 y.o. female with history of HTN, renal cell cancer s/p R nephrectomy, CKD stage III, depression, glaucoma, hypothyrodism who is here for evaluation of chest pain. Remotely saw Dr. Marlou Porch in 2015 for dyspnea who noted prior pulm function testing unremarkable. 2D echo 07/28/14: EF 55-60%, grade 1 DD, normal RV. ETT 07/28/14 did not show any evidence of ischemia. She was noted to have some desat at night on study. On sleep test showed 275 minutes with less than or equal to 88% oxygen saturation at night. It was felt her nocturnal hypoxia was due to OHS. Last labs 01/2017 showed Hgb 13.7, K 4.5, Na 139, BUN 18, Cr 1.0, glucose 81, CO2 31, LDL 112, HDL 54, trig 175. She saw nephrology 02/06/17 - Renal ultrasound was ordered showing no significant abnormalities. It seems from the note this was ordered to evaluate blood flow of the kidneys, but a renal artery duplex was not performed.  She presents back to clinic today for evaluation of chest pain and dyspnea that have been present for about 5 months. Also notes a multitude of other symptoms. 1) She has felt a sensation of chest heaviness, worse with certain tight bras. Has to wear old bras now. Better when she gets up and "gets her heart pumping." Not worse with inspiration.  2) Dyspnea - particularly worsening with exertion recently. States, "this is not me. I'm usually very active." Does not presently exercise. No orthopnea. 3) Lower extremity edema - states this is why [facility] put her on the fluid pill just a few days ago. She's only taken 2 doses so far. Also present for several months. Recently worse with increase in amlodipine from 2.5mg  to 5mg . Also worse when sitting for long periods of time. 4) Several  other symptoms mentioned: altogether just don't feel good, tired, feels like her eyes are poking out, back hurts, her right arm becomes numb if she sleeps on this side. She apparently had quite a bit of emotional trauma when she found out her husband was having an affair when she was age [age]. Says she tends to be nervous so wonders if symptoms may be due to this.   Past Medical History:  Diagnosis Date  . CKD (chronic kidney disease), stage III   . Colon adenoma   . Depression   . Glaucoma   . HTN (hypertension)   . Hypothyroidism   . Obesity   . Obesity hypoventilation syndrome ([place])   . Renal cell cancer ([place])    a. s/p right nephrectomy.    Past Surgical History:  Procedure Laterality Date  . JOINT REPLACEMENT    . KIDNEY SURGERY    . ORIF ANKLE FRACTURE Right 09/01/2014   Procedure: OPEN REDUCTION INTERNAL FIXATION (ORIF) ANKLE FRACTURE;  Surgeon: [name], MD;  Location: [facility];  Service: Orthopedics;  Laterality: Right;  . REPLACEMENT TOTAL KNEE    . TOTAL ABDOMINAL HYSTERECTOMY      Current Medications: Current Outpatient Prescriptions  Medication Sig Dispense Refill  . alprazolam (XANAX) 2 MG tablet Take 2 mg by mouth at bedtime.     Marland Kitchen amLODipine (NORVASC) 2.5 MG tablet Take 5 mg by mouth daily.    . Ascorbic Acid (VITAMIN C)  500 MG CAPS Take 500 mg by mouth daily.     . Calcium Carb-Cholecalciferol (CALCIUM + D3) 600-200 MG-UNIT TABS Take 1 tablet by mouth 2 (two) times daily.     Marland Kitchen FLUoxetine (PROZAC) 20 MG capsule Take 20 mg by mouth every morning.     . Glucosamine Sulfate 500 MG TABS Take 1 tablet by mouth 2 (two) times daily.    . Glucosamine-Chondroitin (GLUCOSAMINE CHONDR COMPLEX PO) Take 1 tablet by mouth 2 (two) times daily.     Marland Kitchen latanoprost (XALATAN) 0.005 % ophthalmic solution Place 1 drop into both eyes at bedtime.    Marland Kitchen levothyroxine (SYNTHROID, LEVOTHROID) 75 MCG tablet Take 75 mcg by mouth daily before breakfast.    . losartan (COZAAR) 100 MG  tablet Take 100 mg by mouth every morning.     . Multiple Vitamin (MULTI-VITAMINS) TABS Take 1 tablet by mouth daily.     Marland Kitchen omeprazole (PRILOSEC) 20 MG capsule Take by mouth daily.     . traZODone (DESYREL) 50 MG tablet Take 1 tablet by mouth at bedtime.      No current facility-administered medications for this visit.      Allergies:   Levaquin [levofloxacin]; Hydrocodone-acetaminophen; Oxycodone; Other; and Quinolones   Social History   Social History  . Marital status: Married    Spouse name: N/A  . Number of children: N/A  . Years of education: N/A   Social History Main Topics  . Smoking status: Never Smoker  . Smokeless tobacco: Never Used  . Alcohol use No  . Drug use: No  . Sexual activity: Not Asked   Other Topics Concern  . None   Social History Narrative  . None     Family History:  Family History  Problem Relation Age of Onset  . Heart disease Father     due to small pox as a [place]  . Lung cancer Paternal Grandfather     lived to age 17    ROS:   Please see the history of present illness. All other systems are reviewed and otherwise negative.    PHYSICAL EXAM:   VS:  BP 124/68   Pulse (!) 58   Ht 4\' 10"  (1.473 m)   Wt 183 lb 6.4 oz (83.2 kg)   SpO2 90%   BMI 38.33 kg/m   BMI: Body mass index is 38.33 kg/m. GEN: Well nourished, well developed obese F, in no acute distress  HEENT: normocephalic, atraumatic Neck: no JVD, carotid bruits, or masses Cardiac: RRR; no murmurs, rubs, or gallops, mild dependent nonpitting edema  Respiratory:  clear to auscultation bilaterally, normal work of breathing GI: soft, nontender, nondistended, + BS MS: no deformity or atrophy  Skin: warm and dry, no rash Neuro:  Alert and Oriented x 3, Strength and sensation are intact, follows commands Psych: euthymic mood, full affect  Wt Readings from Last 3 Encounters:  02/17/17 183 lb 6.4 oz (83.2 kg)  04/18/16 175 lb 3.2 oz (79.5 kg)  04/08/15 184 lb 12.8 oz (83.8  kg)      Studies/Labs Reviewed:   EKG:  EKG was ordered today and personally reviewed by me and demonstrates SB 58bpm, low voltage QRS, no acute St-T changes. Not acutely different from prior.  Recent Labs: No results found for requested labs within last 8760 hours.   Lipid Panel No results found for: CHOL, TRIG, HDL, CHOLHDL, VLDL, LDLCALC, LDLDIRECT  Additional studies/ records that were reviewed today include: Summarized above.  ASSESSMENT & PLAN:   1. Chest pain/dyspnea - chest pain is very atypical, but dyspnea warrants further workup particularly since patient feels these are new for her. It's been about 3 years since her last evaluation. Will proceed with 2D echo and exercise nuclear stress test. She does feel she can walk on a treadmill. If unable to reach target, she is appropriate to switch to [place]. If the above testing is normal I would recommend she engage in regular physical activity as deconditioning and obesity are likely playing some of a role in her symptoms. Of note, she is not tachycardic, tachypneic or hypoxic. She has no pleuritic chest pain or unstable features to suggest PE. 2. Edema - mild on exam, mostly dependent features. Lungs are clear. Will await echocardiogram. She was recently started on Lasix - only has taken 2 doses so far. Follow. Would not empirically push diuretic up too much given her CKD. I suspect recent amlodipine increase is also contributing. Will drop dose back down to 2.5mg  daily. 3. Essential HTN - follow BP with above recent changes. It sounds like the nephrology notes indicated wanting to check out blood flow with the kidneys but only a renal US was performed. Given her normal BP I do not necessarily think renal artery duplex is indicated at this time. 4. CKD III - followed by nephrology. 5. Obesity - as above, would recommend regular physical activity and lifestyle modifications to help with this.  Disposition: F/u with Dr. Marlou Porch care  team APP after above testing.    Medication Adjustments/Labs and Tests Ordered: Current medicines are reviewed at length with the patient today.  Concerns regarding medicines are outlined above. Medication changes, Labs and Tests ordered today are summarized above and listed in the Patient Instructions accessible in Encounters.   Raechel Ache PA-C  02/17/2017 11:33 AM    [address], [place], [place]  [address] Phone: [phone]; Fax: [phone]

## 2017-02-17 NOTE — Patient Instructions (Signed)
Medication Instructions:   START TAKING AMLODIPINE 2.5 MG DAILY   If you need a refill on your cardiac medications before your next appointment, please call your pharmacy.  Labwork: NONE ORDERED  TODAY'   Testing/Procedures: Your physician has requested that you have an echocardiogram. Echocardiography is a painless test that uses sound waves to create images of your heart. It provides your doctor with information about the size and shape of your heart and how well your heart's chambers and valves are working. This procedure takes approximately one hour. There are no restrictions for this procedure.  Your physician has requested that you have en exercise stress myoview. For further information please visit [email or web address]. Please follow instruction sheet, as given.     Follow-Up: WITH DR Marlou Porch CARE TEAM  APP AFTER PROCEDURES    Any Other Special Instructions Will Be Listed Below (If Applicable).

## 2017-03-01 DIAGNOSIS — I10 Essential (primary) hypertension: Secondary | ICD-10-CM | POA: Diagnosis not present

## 2017-03-01 DIAGNOSIS — C641 Malignant neoplasm of right kidney, except renal pelvis: Secondary | ICD-10-CM | POA: Diagnosis not present

## 2017-03-01 DIAGNOSIS — N182 Chronic kidney disease, stage 2 (mild): Secondary | ICD-10-CM | POA: Diagnosis not present

## 2017-03-06 ENCOUNTER — Telehealth (HOSPITAL_COMMUNITY): Payer: Self-pay | Admitting: *Deleted

## 2017-03-06 NOTE — Telephone Encounter (Signed)
Patient given detailed instructions per Myocardial Perfusion Study Information Sheet for the test on 02/26/17 at 1000. Patient notified to arrive 15 minutes early and that it is imperative to arrive on time for appointment to keep from having the test rescheduled.  If you need to cancel or reschedule your appointment, please call the office within 24 hours of your appointment. Failure to do so may result in a cancellation of your appointment, and a $50 no show fee. Patient verbalized understanding.[name], Ranae [place]

## 2017-03-08 ENCOUNTER — Telehealth: Payer: Self-pay | Admitting: Cardiology

## 2017-03-08 ENCOUNTER — Ambulatory Visit (HOSPITAL_BASED_OUTPATIENT_CLINIC_OR_DEPARTMENT_OTHER): Payer: Medicare Other

## 2017-03-08 ENCOUNTER — Ambulatory Visit (HOSPITAL_COMMUNITY): Payer: Medicare Other | Attending: Physician Assistant

## 2017-03-08 ENCOUNTER — Other Ambulatory Visit: Payer: Self-pay

## 2017-03-08 DIAGNOSIS — R079 Chest pain, unspecified: Secondary | ICD-10-CM

## 2017-03-08 DIAGNOSIS — R0789 Other chest pain: Secondary | ICD-10-CM | POA: Diagnosis not present

## 2017-03-08 DIAGNOSIS — R0609 Other forms of dyspnea: Secondary | ICD-10-CM | POA: Diagnosis not present

## 2017-03-08 DIAGNOSIS — I1310 Hypertensive heart and chronic kidney disease without heart failure, with stage 1 through stage 4 chronic kidney disease, or unspecified chronic kidney disease: Secondary | ICD-10-CM | POA: Insufficient documentation

## 2017-03-08 DIAGNOSIS — R0602 Shortness of breath: Secondary | ICD-10-CM

## 2017-03-08 DIAGNOSIS — N189 Chronic kidney disease, unspecified: Secondary | ICD-10-CM | POA: Diagnosis not present

## 2017-03-08 DIAGNOSIS — I2510 Atherosclerotic heart disease of native coronary artery without angina pectoris: Secondary | ICD-10-CM | POA: Diagnosis present

## 2017-03-08 LAB — MYOCARDIAL PERFUSION IMAGING
CHL CUP MPHR: 143 {beats}/min
CHL CUP NUCLEAR SDS: 2
CHL CUP NUCLEAR SRS: 9
CHL CUP RESTING HR STRESS: 62 {beats}/min
Estimated workload: 6.2 METS
Exercise duration (min): 4 min
Exercise duration (sec): 49 s
LHR: 0.3
LV dias vol: 45 mL (ref 46–106)
LV sys vol: 3 mL
Peak HR: 139 {beats}/min
Percent HR: 97 %
SSS: 9
TID: 0.51

## 2017-03-08 LAB — ECHOCARDIOGRAM COMPLETE
E decel time: 211 msec
E/e' ratio: 12.04
FS: 29 % (ref 28–44)
IVS/LV PW RATIO, ED: 1.12
LA ID, A-P, ES: 19 mm
LA diam index: 1.08 cm/m2
LA vol index: 26.6 mL/m2
LA vol: 46.9 mL
LAVOLA4C: 43.1 mL
LDCA: 2.54 cm2
LEFT ATRIUM END SYS DIAM: 19 mm
LV E/e' medial: 12.04
LV E/e'average: 12.04
LV PW d: 10.5 mm — AB (ref 0.6–1.1)
LV e' LATERAL: 7.29 cm/s
LVOT diameter: 18 mm
MV Dec: 211
MV pk A vel: 102 m/s
MV pk E vel: 87.8 m/s
MVPG: 3 mmHg
RV LATERAL S' VELOCITY: 14.7 cm/s
TDI e' lateral: 7.29
TDI e' medial: 7.07

## 2017-03-08 MED ORDER — TECHNETIUM TC 99M TETROFOSMIN IV KIT
32.8000 | PACK | Freq: Once | INTRAVENOUS | Status: AC | PRN
  Administered 2017-03-08: 32.8 via INTRAVENOUS
  Filled 2017-03-08: qty 33

## 2017-03-08 MED ORDER — TECHNETIUM TC 99M TETROFOSMIN IV KIT
10.8000 | PACK | Freq: Once | INTRAVENOUS | Status: AC | PRN
  Administered 2017-03-08: 10.8 via INTRAVENOUS
  Filled 2017-03-08: qty 11

## 2017-03-08 NOTE — Telephone Encounter (Signed)
New Message  Pt voiced calling about results.

## 2017-03-08 NOTE — Telephone Encounter (Signed)
03/08/2017 16:20 ECHO results as per Melina Copa called to patient who verbalized understanding. Georgana Curio MHA RN CCM

## 2017-03-09 ENCOUNTER — Telehealth: Payer: Self-pay | Admitting: Cardiology

## 2017-03-09 NOTE — Telephone Encounter (Signed)
Result Notes   Notes recorded by Jeanann Lewandowsky, RMA on 03/09/2017 at 1:06 PM EDT Pt aware of her stress test results and verbalized understanding. ------

## 2017-03-09 NOTE — Telephone Encounter (Signed)
New message     Pt calling about results from stress test.

## 2017-03-10 ENCOUNTER — Ambulatory Visit (INDEPENDENT_AMBULATORY_CARE_PROVIDER_SITE_OTHER): Payer: Medicare Other | Admitting: Cardiology

## 2017-03-10 ENCOUNTER — Telehealth: Payer: Self-pay | Admitting: Cardiology

## 2017-03-10 ENCOUNTER — Encounter: Payer: Self-pay | Admitting: Cardiology

## 2017-03-10 VITALS — BP 124/76 | HR 76 | Ht 59.0 in | Wt 183.8 lb

## 2017-03-10 DIAGNOSIS — R079 Chest pain, unspecified: Secondary | ICD-10-CM

## 2017-03-10 DIAGNOSIS — E6609 Other obesity due to excess calories: Secondary | ICD-10-CM | POA: Diagnosis not present

## 2017-03-10 DIAGNOSIS — Z6837 Body mass index (BMI) 37.0-37.9, adult: Secondary | ICD-10-CM | POA: Diagnosis not present

## 2017-03-10 DIAGNOSIS — N183 Chronic kidney disease, stage 3 unspecified: Secondary | ICD-10-CM

## 2017-03-10 DIAGNOSIS — IMO0001 Reserved for inherently not codable concepts without codable children: Secondary | ICD-10-CM

## 2017-03-10 DIAGNOSIS — I10 Essential (primary) hypertension: Secondary | ICD-10-CM | POA: Diagnosis not present

## 2017-03-10 NOTE — Progress Notes (Signed)
Cardiology Office Note   Date:  03/10/2017   ID:  Tammie [name], DOB 11/29/1940, MRN [id number]  PCP:  Tammie Naas, MD  Cardiologist:  Dr. Marlou [name].      Chief Complaint  Patient presents with  . Chest Pain    echo f/u nuc study      History of Present Illness: Tammie [name] is a 76 y.o. female who presents for chest pain.   She has a history of HTN, renal cell cancer s/p R nephrectomy, CKD stage III, depression, glaucoma, hypothyrodism who is here for evaluation of chest pain. Remotely saw Dr. Marlou [name] in 2015 for dyspnea who noted prior pulm function testing unremarkable. 2D echo 07/28/14: EF 55-60%, grade 1 DD, normal RV. ETT 07/28/14 did not show any evidence of ischemia. She was noted to have some desat at night on study. On sleep test showed 275 minutes with less than or equal to 88% oxygen saturation at night. It was felt her nocturnal hypoxia was due to OHS. Last labs 01/2017 showed Hgb 13.7, K 4.5, Na 139, BUN 18, Cr 1.0, glucose 81, CO2 31, LDL 112, HDL 54, trig 175. She saw nephrology 02/06/17 - Renal ultrasound was ordered showing no significant abnormalities. It seems from the note this was ordered to evaluate blood flow of the kidneys, but a renal artery duplex was not performed.  On last visit 02/17/17 with chest heaviness, dyspnea, lower ext edema and just not feeling well. She had 2 D echo and and exercise nuc study.  Edema on lasix , HTN.    Echo with EF 60-65%, no RWMA, G!DD no change from prior study.  Nuc with no ischemia EF was 94%.  Hypertensive response to exercise.   Pt is doing well today, no further chest discomfort and no edema.  She has stopped her lasix without edema and it was making her very constipated.  I reviewed the test results with her and reassured her about her heart.  She does not do much exercise at home due to back pain and surgery on her arm.  She has one ankle that was fractured and has plates and screws.  She does like to dance but  encouraged to be careful and not fall.    Past Medical History:  Diagnosis Date  . CKD (chronic kidney disease), stage III   . Colon adenoma   . Depression   . Glaucoma   . HTN (hypertension)   . Hypothyroidism   . Obesity   . Obesity hypoventilation syndrome ([place])   . Renal cell cancer ([place])    a. s/p right nephrectomy.    Past Surgical History:  Procedure Laterality Date  . JOINT REPLACEMENT    . KIDNEY SURGERY    . ORIF ANKLE FRACTURE Right 09/01/2014   Procedure: OPEN REDUCTION INTERNAL FIXATION (ORIF) ANKLE FRACTURE;  Surgeon: [name], MD;  Location: [facility];  Service: Orthopedics;  Laterality: Right;  . REPLACEMENT TOTAL KNEE    . TOTAL ABDOMINAL HYSTERECTOMY       Current Outpatient Prescriptions  Medication Sig Dispense Refill  . alprazolam (XANAX) 2 MG tablet Take 2 mg by mouth at bedtime.     Marland Kitchen amLODipine (NORVASC) 2.5 MG tablet Take 2.5 mg by mouth daily.     . Ascorbic Acid (VITAMIN C) 500 MG CAPS Take 500 mg by mouth daily.     . Calcium Carb-Cholecalciferol (CALCIUM + D3) 600-200 MG-UNIT TABS Take 1 tablet by mouth 2 (two) times daily.     Marland Kitchen  FLUoxetine (PROZAC) 20 MG capsule Take 20 mg by mouth every morning.     . furosemide (LASIX) 20 MG tablet Take 20 mg by mouth every other day.    . Glucosamine Sulfate 500 MG TABS Take 1 tablet by mouth 2 (two) times daily.    . Glucosamine-Chondroitin (GLUCOSAMINE CHONDR COMPLEX PO) Take 1 tablet by mouth 2 (two) times daily.     Marland Kitchen latanoprost (XALATAN) 0.005 % ophthalmic solution Place 1 drop into both eyes at bedtime.    Marland Kitchen levothyroxine (SYNTHROID, LEVOTHROID) 75 MCG tablet Take 75 mcg by mouth daily before breakfast.    . losartan (COZAAR) 100 MG tablet Take 100 mg by mouth every morning.     . Multiple Vitamin (MULTI-VITAMINS) TABS Take 1 tablet by mouth daily.     Marland Kitchen omeprazole (PRILOSEC) 20 MG capsule Take by mouth daily.     . traZODone (DESYREL) 50 MG tablet Take 1 tablet by mouth at bedtime.      No  current facility-administered medications for this visit.     Allergies:   Levaquin [levofloxacin]; Hydrocodone-acetaminophen; Oxycodone; Other; and Quinolones    Social History:  The patient  reports that she has never smoked. She has never used smokeless tobacco. She reports that she does not drink alcohol or use drugs.   Family History:  The patient's family history includes Heart disease in her father; Lung cancer in her paternal grandfather.    ROS:  General:no colds or fevers, no weight changes Skin:no rashes or ulcers HEENT:no blurred vision, no congestion CV:see HPI PUL:see HPI GI:no diarrhea constipation or melena, no indigestion GU:no hematuria, no dysuria MS:no joint pain, no claudication, + back pain Neuro:no syncope, no lightheadedness Endo:no diabetes, no thyroid disease  Wt Readings from Last 3 Encounters:  03/10/17 183 lb 12.8 oz (83.4 kg)  03/08/17 183 lb (83 kg)  02/17/17 183 lb 6.4 oz (83.2 kg)     PHYSICAL EXAM: VS:  BP 124/76   Pulse 76   Ht 4\' 11"  (1.499 m)   Wt 183 lb 12.8 oz (83.4 kg)   SpO2 96%   BMI 37.12 kg/m  , BMI Body mass index is 37.12 kg/m. General:Pleasant affect, NAD Skin:Warm and dry, brisk capillary refill HEENT:normocephalic, sclera clear, mucus membranes moist Neck:supple, no JVD, no bruits  Heart:S1S2 RRR without murmur, gallup, rub or click Lungs:clear without rales, rhonchi, or wheezes FKC:LEXN, non tender, + BS, do not palpate liver spleen or masses Ext:no lower ext edema, 2+ pedal pulses, 2+ radial pulses Neuro:alert and oriented, MAE, follows commands, + facial symmetry    EKG:  EKG is not ordered today.   Recent Labs: No results found for requested labs within last 8760 hours.    Lipid Panel No results found for: CHOL, TRIG, HDL, CHOLHDL, VLDL, LDLCALC, LDLDIRECT     Other studies Reviewed: Additional studies/ records that were reviewed today include: . Nuc study  Nuclear stress EF: 94%.  There was no ST  segment deviation noted during stress.  Blood pressure demonstrated a hypertensive response to exercise.  This is a low risk study.  The left ventricular ejection fraction is hyperdynamic (>65%).   1. No evidence for ischemia by ST segment analysis on stress ECG.  2. No definite evidence for ischemia or infarction on perfusion images.  Count-poor rest study makes interpretation more difficult.  3. Hyperdynamic LV.  Echo: Study Conclusions  - Left ventricle: The cavity size was normal. There was mild focal   basal hypertrophy of  the septum. Systolic function was normal.   The estimated ejection fraction was in the range of 60% to 65%.   Wall motion was normal; there were no regional wall motion   abnormalities. Doppler parameters are consistent with abnormal   left ventricular relaxation (grade 1 diastolic dysfunction). - Right atrium: The atrium was mildly dilated.  Impressions:  - Compared to the prior study, there has been no significant   interval change.  ASSESSMENT AND PLAN:  1.  Chest pain/dyspnea now resolved and Nuc stress test is normal and Echo with no change since 2015 with G1DD.pt was reassured.  Will have her follow up with Dr. Marlou [name] in 3-4 months.  2. Edema has resolved and she is no longer taking lasix.  Lungs remain clear  3. HTN BP is stable today   4. Obesity discussed exercise she is afraid of falling and has Back pain  5. CKD-3 followed by nephrology    Current medicines are reviewed with the patient today.  The patient Has no concerns regarding medicines.  The following changes have been made:  See above Labs/ tests ordered today include:see above  Disposition:   FU:  see above  Signed, Cecilie Kicks, NP  03/10/2017 5:00 PM    [address], [address] [place], [place] Phone: [phone]; Fax: [phone]

## 2017-03-10 NOTE — Patient Instructions (Signed)
Medication Instructions:  None Ordered   Labwork: None Ordered   Testing/Procedures: .non   Follow-Up: Your physician recommends that you schedule a follow-up appointment in: 3-4 months with Dr. Marlou Porch  Any Other Special Instructions Will Be Listed Below (If Applicable).     If you need a refill on your cardiac medications before your next appointment, please call your pharmacy.

## 2017-03-10 NOTE — Telephone Encounter (Signed)
Patient want to know why she is not seeing Dr. Radford Pax who she has seen in the past.   Want to switch to Dr. Radford Pax.

## 2017-03-11 NOTE — Telephone Encounter (Signed)
I have not seen her in years and she was last seen by Dr. Marlou Porch who I would prefer she stay with

## 2017-03-14 NOTE — Telephone Encounter (Signed)
Documentation given to scheduling as FYI.

## 2017-04-08 IMAGING — XA Imaging study
3 series · 3 of 3 positions shown · non-contrast
Comparison: none

CLINICAL DATA: Lumbosacral spondylosis without myelopathy

[Series 1: ortho standard · 1 of 1 slices shown (1 of 3)]
[im 1/1]
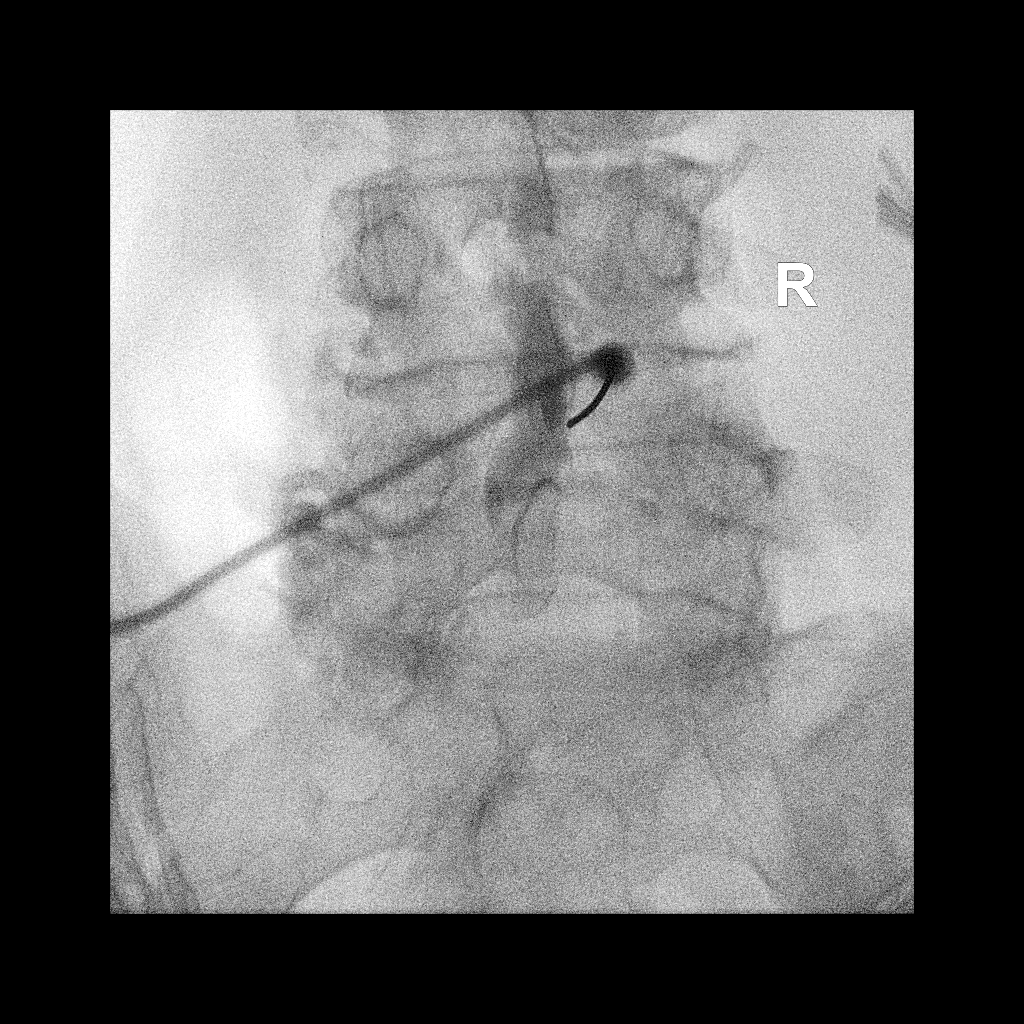

[Series 2: ortho standard · 1 of 1 slices shown (2 of 3)]
[im 1/1]
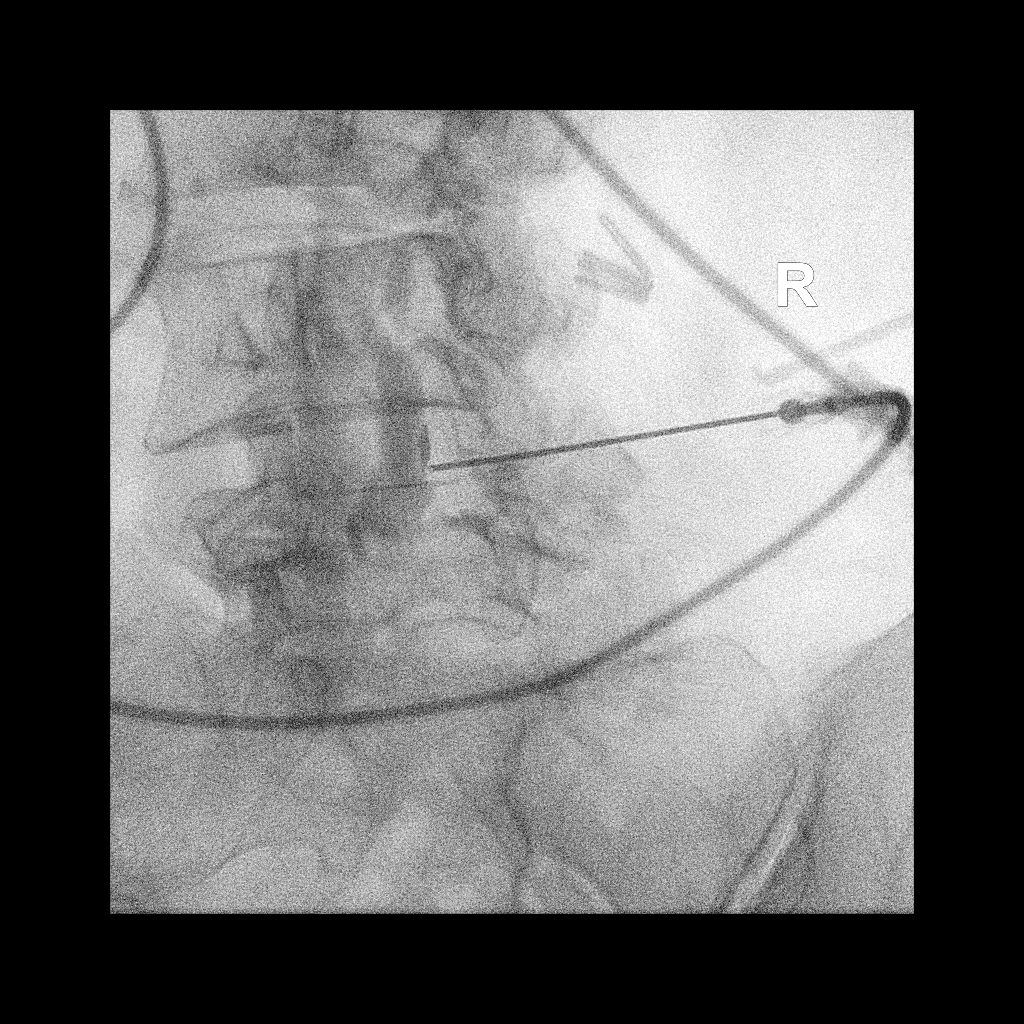

[Series 3: ortho standard · 1 of 1 slices shown (3 of 3)]
[im 1/1]
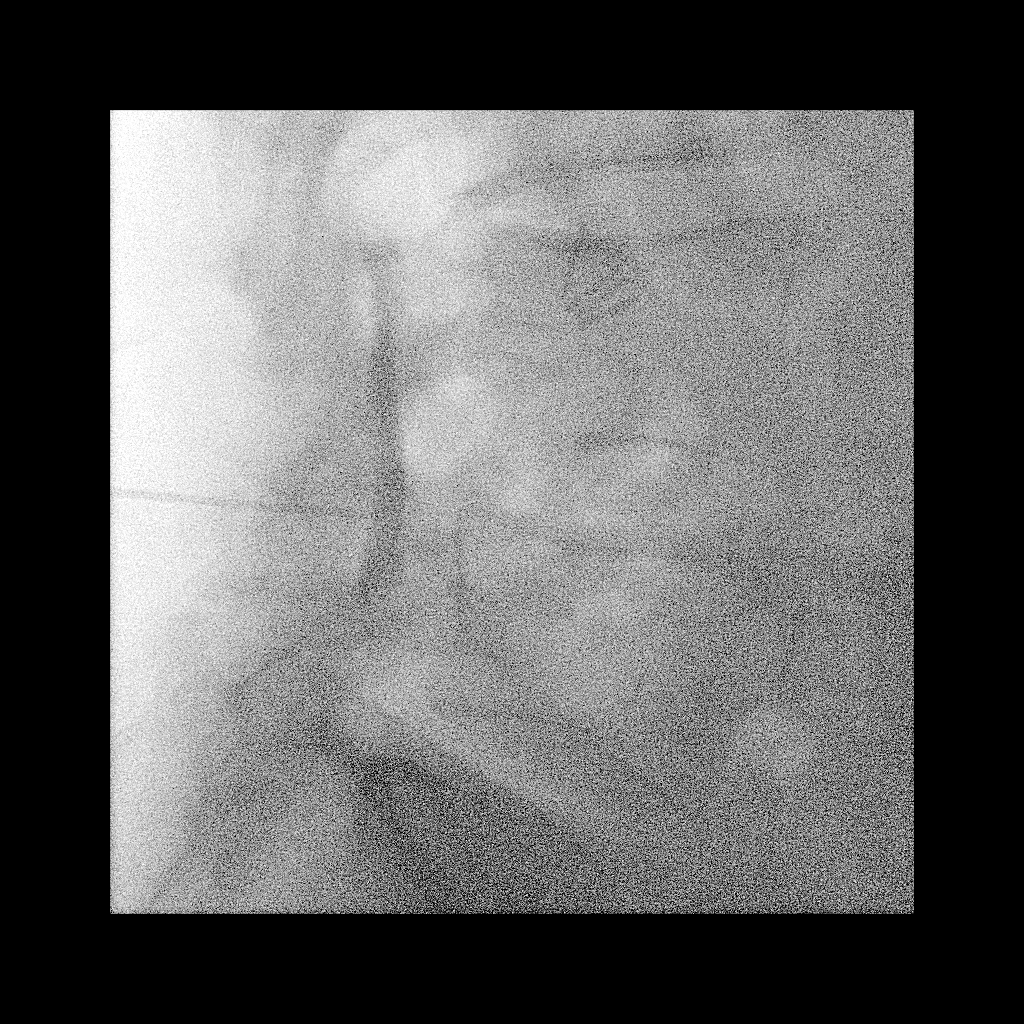

[3 of 3 positions shown; findings below may reference images not displayed]

FLUOROSCOPY TIME:  35 second

PROCEDURE:
The procedure, risks, benefits, and alternatives were explained to
the patient. Questions regarding the procedure were encouraged and
answered. The patient understands and consents to the procedure.

LUMBAR EPIDURAL INJECTION:

An interlaminar approach was performed on right at L4-5. The
overlying skin was cleansed and anesthetized. A 20 gauge epidural
needle was advanced using loss-of-resistance technique.

DIAGNOSTIC EPIDURAL INJECTION:

Injection of Isovue-M 200 shows a good epidural pattern with spread
above and below the level of needle placement, primarily on the
right no vascular opacification is seen.

THERAPEUTIC EPIDURAL INJECTION:

One hundred twenty Mg of Depo-Medrol mixed with 4 cc 1% lidocaine
were instilled. The procedure was well-tolerated, and the patient
was discharged thirty minutes following the injection in good
condition.

COMPLICATIONS:
None
IMPRESSION: Technically successful epidural injection on the right L4-5.

## 2017-05-12 DIAGNOSIS — M810 Age-related osteoporosis without current pathological fracture: Secondary | ICD-10-CM | POA: Diagnosis not present

## 2017-05-16 ENCOUNTER — Ambulatory Visit (INDEPENDENT_AMBULATORY_CARE_PROVIDER_SITE_OTHER): Payer: Medicare Other

## 2017-05-16 ENCOUNTER — Ambulatory Visit (INDEPENDENT_AMBULATORY_CARE_PROVIDER_SITE_OTHER): Payer: Medicare Other | Admitting: Orthopaedic Surgery

## 2017-05-16 DIAGNOSIS — M25571 Pain in right ankle and joints of right foot: Secondary | ICD-10-CM

## 2017-05-16 MED ORDER — LIDOCAINE HCL 1 % IJ SOLN
1.0000 mL | INTRAMUSCULAR | Status: AC | PRN
  Administered 2017-05-16: 1 mL

## 2017-05-16 MED ORDER — BUPIVACAINE HCL 0.5 % IJ SOLN
1.0000 mL | INTRAMUSCULAR | Status: AC | PRN
  Administered 2017-05-16: 1 mL via INTRA_ARTICULAR

## 2017-05-16 MED ORDER — METHYLPREDNISOLONE ACETATE 40 MG/ML IJ SUSP
40.0000 mg | INTRAMUSCULAR | Status: AC | PRN
  Administered 2017-05-16: 40 mg via INTRA_ARTICULAR

## 2017-05-16 NOTE — Progress Notes (Signed)
Office Visit Note   Patient: Tammie [name]           Date of Birth: [birth year]           MRN: [id number] Visit Date: 05/16/2017              Requested by: Carol Ada, [place] [place], [place] [id number] PCP: Carol Ada, MD   Assessment & Plan: Visit Diagnoses:  1. Pain in right ankle and joints of right foot     Plan: Overall impression is right ankle posttraumatic arthritis flareup. Ankle injection was performed today with cortisone. Relative rest, ASO brace, ice. Follow-up with me as needed.  Follow-Up Instructions: Return if symptoms worsen or fail to improve.   Orders:  Orders Placed This Encounter  Procedures  . XR Ankle Complete Right   No orders of the defined types were placed in this encounter.     Procedures: Medium Joint Inj Date/Time: 05/16/2017 3:09 PM Performed by: Leandrew Koyanagi Authorized by: Leandrew Koyanagi   Consent Given by:  Patient Timeout: prior to procedure the correct patient, procedure, and site was verified   Indications:  Pain Location:  Ankle Site:  R ankle Prep: patient was prepped and draped in usual sterile fashion   Needle Size:  22 G Approach:  Dorsal Ultrasound Guided: No   Fluoroscopic Guidance: No   Medications:  1 mL lidocaine 1 %; 1 mL bupivacaine 0.5 %; 40 mg methylPREDNISolone acetate 40 MG/ML     Clinical Data: No additional findings.   Subjective: Chief Complaint  Patient presents with  . Right Ankle - Follow-up    Hx of surgery on 09/01/2014    Patient is a 76 year old female who is 3 years status post ORIF right ankle fracture. She has been doing well until she suddenly developed right ankle pain. The pain does not radiate. It is circumferential around the ankle. She denies any injuries.    Review of Systems  Constitutional: Negative.   HENT: Negative.   Eyes: Negative.   Respiratory: Negative.   Cardiovascular: Negative.   Endocrine: Negative.   Musculoskeletal: Negative.    Neurological: Negative.   Hematological: Negative.   Psychiatric/Behavioral: Negative.   All other systems reviewed and are negative.    Objective: Vital Signs: There were no vitals taken for this visit.  Physical Exam  Constitutional: She is oriented to person, place, and time. She appears well-developed and well-nourished.  Pulmonary/Chest: Effort normal.  Neurological: She is alert and oriented to person, place, and time.  Skin: Skin is warm. Capillary refill takes less than 2 seconds.  Psychiatric: She has a normal mood and affect. Her behavior is normal. Judgment and thought content normal.  Nursing note and vitals reviewed.   Ortho Exam Right ankle exam shows no significant swelling or skin changes. She is neurovascularly intact. She has good range of motion of the ankle but with pain especially with dorsiflexion. Specialty Comments:  No specialty comments available.  Imaging: Xr Ankle Complete Right  Result Date: 05/16/2017 Stable fixation and healed fracture.    PMFS History: Patient Active Problem List   Diagnosis Date Noted  . Generalized anxiety disorder 04/18/2016  . Ankle fracture, bimalleolar, closed 08/31/2014  . CKD (chronic kidney disease) stage 2, GFR 60-89 ml/min 08/31/2014  . Uncontrolled pain 08/31/2014  . Hypothyroidism 08/31/2014  . Nocturnal hypoxemia 03/28/2014   Past Medical History:  Diagnosis Date  . CKD (chronic kidney disease), stage III   .  Colon adenoma   . Depression   . Glaucoma   . HTN (hypertension)   . Hypothyroidism   . Obesity   . Obesity hypoventilation syndrome ([place])   . Renal cell cancer ([place])    a. s/p right nephrectomy.    Family History  Problem Relation Age of Onset  . Heart disease Father        due to small pox as a [place]  . Lung cancer Paternal Grandfather        lived to age 40    Past Surgical History:  Procedure Laterality Date  . JOINT REPLACEMENT    . KIDNEY SURGERY    . ORIF ANKLE FRACTURE Right  09/01/2014   Procedure: OPEN REDUCTION INTERNAL FIXATION (ORIF) ANKLE FRACTURE;  Surgeon: [name], MD;  Location: [facility];  Service: Orthopedics;  Laterality: Right;  . REPLACEMENT TOTAL KNEE    . TOTAL ABDOMINAL HYSTERECTOMY     Social History   Occupational History  . Not on file.   Social History Main Topics  . Smoking status: Never Smoker  . Smokeless tobacco: Never Used  . Alcohol use No  . Drug use: No  . Sexual activity: Not on file

## 2017-06-15 DIAGNOSIS — N183 Chronic kidney disease, stage 3 (moderate): Secondary | ICD-10-CM | POA: Diagnosis not present

## 2017-06-15 DIAGNOSIS — Z905 Acquired absence of kidney: Secondary | ICD-10-CM | POA: Diagnosis not present

## 2017-06-15 DIAGNOSIS — I10 Essential (primary) hypertension: Secondary | ICD-10-CM | POA: Diagnosis not present

## 2017-06-15 DIAGNOSIS — C641 Malignant neoplasm of right kidney, except renal pelvis: Secondary | ICD-10-CM | POA: Diagnosis not present

## 2017-06-15 DIAGNOSIS — N182 Chronic kidney disease, stage 2 (mild): Secondary | ICD-10-CM | POA: Diagnosis not present

## 2017-07-10 ENCOUNTER — Ambulatory Visit (INDEPENDENT_AMBULATORY_CARE_PROVIDER_SITE_OTHER): Payer: Medicare Other | Admitting: Cardiology

## 2017-07-10 VITALS — BP 136/72 | HR 66 | Ht <= 58 in | Wt 186.8 lb

## 2017-07-10 DIAGNOSIS — R0602 Shortness of breath: Secondary | ICD-10-CM | POA: Diagnosis not present

## 2017-07-10 DIAGNOSIS — N183 Chronic kidney disease, stage 3 unspecified: Secondary | ICD-10-CM

## 2017-07-10 DIAGNOSIS — R079 Chest pain, unspecified: Secondary | ICD-10-CM | POA: Diagnosis not present

## 2017-07-10 DIAGNOSIS — I10 Essential (primary) hypertension: Secondary | ICD-10-CM | POA: Diagnosis not present

## 2017-07-10 NOTE — Patient Instructions (Addendum)
Medication Instructions:  No changes  Labwork: None today  Testing/Procedures: None today  Follow-Up: As needed.  Thank you for choosing [facility]!!

## 2017-07-10 NOTE — Progress Notes (Signed)
Cardiology Office Note:    Date:  07/10/2017   ID:  Tammie [name], DOB August 16, 1941, MRN [id number]  PCP:  Carol Ada, MD  Cardiologist:  Candee Furbish, MD    Referring MD: Carol Ada, MD    History of Present Illness:    Tammie [name] is a 76 y.o. female here for follow-up chest pain.  Prior visit in April 2018 with chest heaviness, dyspnea, lower external edema was followed up with echocardiogram and exercise nuclear stress test which were reassuring. No ischemia. Normal ejection fraction.  She is a history of renal cell cancer status post nephrectomy, depression, chronic kidney disease stage III. I saw her previously in 2015 for similar situation, dyspnea and pulmonary functions tests were unremarkable.  Lasix the past was discontinued because of constipation and concern for renal function.  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 She wants to be able to get out of the house and see people.  No further chest pain, no syncope, no bleeding, no orthopnea. She did ask about weight loss, dietary modification. When she stops eating bread she says that her hair falls out.  Past Medical History:  Diagnosis Date  . CKD (chronic kidney disease), stage III   . Colon adenoma   . Depression   . Glaucoma   . HTN (hypertension)   . Hypothyroidism   . Obesity   . Obesity hypoventilation syndrome ([place])   . Renal cell cancer ([place])    a. s/p right nephrectomy.    Past Surgical History:  Procedure Laterality Date  . JOINT REPLACEMENT    . KIDNEY SURGERY    . ORIF ANKLE FRACTURE Right 09/01/2014   Procedure: OPEN REDUCTION INTERNAL FIXATION (ORIF) ANKLE FRACTURE;  Surgeon: [name], MD;  Location: [facility];  Service: Orthopedics;  Laterality: Right;  . REPLACEMENT TOTAL KNEE    . TOTAL ABDOMINAL HYSTERECTOMY      Current Medications: Current Meds  Medication Sig  .  alprazolam (XANAX) 2 MG tablet Take 2 mg by mouth at bedtime.   Marland Kitchen amLODipine (NORVASC) 2.5 MG tablet Take 2.5 mg by mouth daily.   . Ascorbic Acid (VITAMIN C) 500 MG CAPS Take 500 mg by mouth daily.   . Calcium Carb-Cholecalciferol (CALCIUM + D3) 600-200 MG-UNIT TABS Take 1 tablet by mouth 2 (two) times daily.   Marland Kitchen FLUoxetine (PROZAC) 20 MG capsule Take 20 mg by mouth every morning.   . furosemide (LASIX) 20 MG tablet Take 20 mg by mouth every other day.  . Glucosamine-Chondroitin (GLUCOSAMINE CHONDR COMPLEX PO) Take 1 tablet by mouth 2 (two) times daily.   Marland Kitchen latanoprost (XALATAN) 0.005 % ophthalmic solution Place 1 drop into the left eye at bedtime.   Marland Kitchen levothyroxine (SYNTHROID, LEVOTHROID) 75 MCG tablet Take 75 mcg by mouth daily before breakfast.  . losartan (COZAAR) 100 MG tablet Take 100 mg by mouth every morning.   . Multiple Vitamin (MULTI-VITAMINS) TABS Take 1 tablet by mouth daily.   Marland Kitchen omeprazole (PRILOSEC) 20 MG capsule Take by mouth daily.   . traZODone (DESYREL) 50 MG tablet Take 1 tablet by mouth at bedtime.      Allergies:   Levaquin [levofloxacin]; Hydrocodone-acetaminophen; Oxycodone; Other; and Quinolones   Social History   Social History  . Marital status: Married    Spouse name: N/A  . Number of children: N/A  . Years of education:  N/A   Social History Main Topics  . Smoking status: Never Smoker  . Smokeless tobacco: Never Used  . Alcohol use No  . Drug use: No  . Sexual activity: Not on file   Other Topics Concern  . Not on file   Social History Narrative  . No narrative on file     Family History: The patient's family history includes Heart disease in her father; Lung cancer in her paternal grandfather. ROS:   Please see the history of present illness.     All other systems reviewed and are negative.  EKGs/Labs/Other Studies Reviewed:    The following studies were reviewed today:  Nuc study  Nuclear stress EF: 94%.  There was no ST segment  deviation noted during stress.  Blood pressure demonstrated a hypertensive response to exercise.  This is a low risk study.  The left ventricular ejection fraction is hyperdynamic (>65%).  1. No evidence for ischemia by ST segment analysis on stress ECG.  2. No definite evidence for ischemia or infarction on perfusion images. Count-poor rest study makes interpretation more difficult.  3. Hyperdynamic LV.  Echo: Study Conclusions  - Left ventricle: The cavity size was normal. There was mild focal basal hypertrophy of the septum. Systolic function was normal. The estimated ejection fraction was in the range of 60% to 65%. Wall motion was normal; there were no regional wall motion abnormalities. Doppler parameters are consistent with abnormal left ventricular relaxation (grade 1 diastolic dysfunction). - Right atrium: The atrium was mildly dilated.  Impressions:  - Compared to the prior study, there has been no significant interval change.  EKG:    Recent Labs: No results found for requested labs within last 8760 hours.  Recent Lipid Panel No results found for: CHOL, TRIG, HDL, CHOLHDL, VLDL, LDLCALC, LDLDIRECT  Physical Exam:    VS:  BP 136/72   Pulse 66   Ht 4\' 10"  (1.473 m)   Wt 186 lb 12.8 oz (84.7 kg)   BMI 39.04 kg/m     Wt Readings from Last 3 Encounters:  07/10/17 186 lb 12.8 oz (84.7 kg)  03/10/17 183 lb 12.8 oz (83.4 kg)  03/08/17 183 lb (83 kg)     GEN:  Well nourished, well developed in no acute distress HEENT: Normal NECK: No JVD; No carotid bruits LYMPHATICS: No lymphadenopathy CARDIAC: RRR, no murmurs, rubs, gallops RESPIRATORY:  Clear to auscultation without rales, wheezing or rhonchi  ABDOMEN: Soft, non-tender, non-distended MUSCULOSKELETAL:  No edema; No deformity  SKIN: Warm and dry NEUROLOGIC:  Alert and oriented x 3 PSYCHIATRIC:  Normal affect   ASSESSMENT:    1. Chest pain, unspecified type   2. Essential  hypertension   3. Shortness of breath   4. CKD (chronic kidney disease), stage III   5. Morbid obesity ([place])    PLAN:    In order of problems listed above: Marland Kitchen Chest pain  - Associated with dyspnea, nuclear stress test once again normal, echocardiogram no change since 2015, normal ejection fraction. Reassurance.  Edema  - Resolved improved  Essential hypertension  - Stable blood pressure. Doing well.  Obesity  - Has had trouble with falls in the past, broken arm. Continue to encourage exercise.  Please let us know we can be of further assistance. She would like some dietary assistance.  Medication Adjustments/Labs and Tests Ordered: Current medicines are reviewed at length with the patient today.  Concerns regarding medicines are outlined above.  Orders Placed This Encounter  Procedures  . Ambulatory referral to Nutrition and Diabetic Education   No orders of the defined types were placed in this encounter.   Signed, Candee Furbish, MD  07/10/2017 10:57 AM    [facility]

## 2017-07-19 ENCOUNTER — Ambulatory Visit: Payer: Medicare Other | Admitting: Registered"

## 2017-07-28 ENCOUNTER — Ambulatory Visit: Payer: Medicare Other | Admitting: Registered"

## 2017-08-03 DIAGNOSIS — Z1231 Encounter for screening mammogram for malignant neoplasm of breast: Secondary | ICD-10-CM | POA: Diagnosis not present

## 2017-08-03 DIAGNOSIS — M85832 Other specified disorders of bone density and structure, left forearm: Secondary | ICD-10-CM | POA: Diagnosis not present

## 2017-08-04 ENCOUNTER — Encounter: Payer: Medicare Other | Attending: Family Medicine | Admitting: Registered"

## 2017-08-04 ENCOUNTER — Encounter: Payer: Self-pay | Admitting: Registered"

## 2017-08-04 DIAGNOSIS — I129 Hypertensive chronic kidney disease with stage 1 through stage 4 chronic kidney disease, or unspecified chronic kidney disease: Secondary | ICD-10-CM | POA: Insufficient documentation

## 2017-08-04 DIAGNOSIS — R0602 Shortness of breath: Secondary | ICD-10-CM | POA: Insufficient documentation

## 2017-08-04 DIAGNOSIS — Z713 Dietary counseling and surveillance: Secondary | ICD-10-CM | POA: Diagnosis not present

## 2017-08-04 DIAGNOSIS — N182 Chronic kidney disease, stage 2 (mild): Secondary | ICD-10-CM | POA: Insufficient documentation

## 2017-08-04 NOTE — Patient Instructions (Signed)
Continue getting in three meals per day. Try to have balanced meals like the plate example (see handout).  Continue being mindful of sodium content in foods and trying to choose low sodium foods. Calorieking.com can be used to view nutrition information for foods at restaurants.   Try to be eat mindfully-try to put away electronics at mealtimes to promote listening to your body's hunger and fullness cues.   For physical activity-try to include activity as able and per doctor's recommendations. If unsure if you should do any type of activity, always consult your doctor before doing the exercise.

## 2017-08-04 NOTE — Progress Notes (Signed)
Medical Nutrition Therapy:  Appt start time: 1050 end time:  3825.   Assessment:  Primary concerns today: Pt referred for weight management, hypertension, and CKD stage 2. Pt says she had kidney cancer in the past and had to have a  right nephrectomy. She says her kidney function has decreased more recently and at her last doctor appointment she was told one side of her heart muscle has weakened. She says she wants to lose weight and thinks doing so will help her feel better. Pt says she would like nutrition information that can help improve her health. 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 Pt lives by herself.   Preferred Learning Style:   No preference indicated   Learning Readiness:   Ready  MEDICATIONS: See list.    DIETARY INTAKE:  Usual eating pattern includes 3 meals and more than 2 snacks each day. Typical snacks include dates, raisins, other fruits, or nuts. Pt says she will eat several snacks during the day, but very small amounts at a time.   Everyday foods include cheese. Pt says she tries to limit her sodium intake. Pt says she usually eats her meals at her coffee table with electronics present.   24-hr recall:  B ( AM): cheese, peanuts, 1 glass of 1% milk  Snk ( AM): None reported.  L ( PM): [employer] apple pecan salad, pomegranate dressing, water   Snk ( PM):  2 dates, raisins,1 orange,1 cookie (at different times), water  D ( PM): grilled chicken sandwich with lettuce and tomato from [employer]  Snk ( PM): 1 cookie Beverages: 1% milk, water   Usual physical activity: None reported. Pt says she would like to do more yard work but it is hard for her due to back pain. She says walking is also very painful for her.   Progress Towards Goal(s):  In progress.   Nutritional Diagnosis:  NB-1.1 Food and nutrition-related knowledge deficit As related to sodium content of food in packages and in restaurants .  As evidenced by pt consuming  foods high in sodium from restaurants and unsure how to assess sodium content using a nutrition label .    Intervention:  Nutrition counseling provided. Dietitian provided education regarding balanced nutrition. Dietitian discussed importance of focusing on overall healthy habits rather than weight. Dietitian provided low sodium nutrition therapy and education on using nutrition labels and calorie king to evaluate sodium in packaged and restaurant foods. Provided education on mindful eating. Pt appeared agreeable to information/goals discussed.   Goals:   Continue getting in three meals per day. Try to have balanced meals like the plate example (see handout).  Continue being mindful of sodium content in foods and trying to choose low sodium foods. Calorieking.com can be used to view nutrition information for foods at restaurants.   Try to be eat mindfully-try to put away electronics at mealtimes to promote listening to your body's hunger and fullness cues.   For physical activity-try to include activity as able and per doctor's recommendations. If unsure if you should do any type of activity, always consult your doctor before doing the exercise.   Teaching Method Utilized:  Visual Auditory  Handouts given during visit include:  Balanced plate with food list   Chair Exercises   Barriers to learning/adherence to lifestyle change: None indicated.   Demonstrated degree of understanding via:  Teach Back   Monitoring/Evaluation:  Dietary intake, exercise, and body weight prn.

## 2017-08-11 ENCOUNTER — Ambulatory Visit: Payer: Medicare Other | Admitting: Registered"

## 2017-09-12 DIAGNOSIS — Z961 Presence of intraocular lens: Secondary | ICD-10-CM | POA: Diagnosis not present

## 2017-09-12 DIAGNOSIS — H401434 Capsular glaucoma with pseudoexfoliation of lens, bilateral, indeterminate stage: Secondary | ICD-10-CM | POA: Diagnosis not present

## 2017-09-18 DIAGNOSIS — Z90710 Acquired absence of both cervix and uterus: Secondary | ICD-10-CM | POA: Diagnosis not present

## 2017-09-18 DIAGNOSIS — Z8614 Personal history of Methicillin resistant Staphylococcus aureus infection: Secondary | ICD-10-CM | POA: Diagnosis not present

## 2017-09-18 DIAGNOSIS — Z87442 Personal history of urinary calculi: Secondary | ICD-10-CM | POA: Diagnosis not present

## 2017-09-18 DIAGNOSIS — Z6835 Body mass index (BMI) 35.0-35.9, adult: Secondary | ICD-10-CM | POA: Diagnosis not present

## 2017-09-18 DIAGNOSIS — Z780 Asymptomatic menopausal state: Secondary | ICD-10-CM | POA: Diagnosis not present

## 2017-09-18 DIAGNOSIS — H401112 Primary open-angle glaucoma, right eye, moderate stage: Secondary | ICD-10-CM | POA: Diagnosis not present

## 2017-09-18 DIAGNOSIS — Z85528 Personal history of other malignant neoplasm of kidney: Secondary | ICD-10-CM | POA: Diagnosis not present

## 2017-09-18 DIAGNOSIS — Z881 Allergy status to other antibiotic agents status: Secondary | ICD-10-CM | POA: Diagnosis not present

## 2017-09-18 DIAGNOSIS — K219 Gastro-esophageal reflux disease without esophagitis: Secondary | ICD-10-CM | POA: Diagnosis not present

## 2017-09-18 DIAGNOSIS — M1990 Unspecified osteoarthritis, unspecified site: Secondary | ICD-10-CM | POA: Diagnosis not present

## 2017-09-18 DIAGNOSIS — G8929 Other chronic pain: Secondary | ICD-10-CM | POA: Diagnosis not present

## 2017-09-18 DIAGNOSIS — Z905 Acquired absence of kidney: Secondary | ICD-10-CM | POA: Diagnosis not present

## 2017-09-18 DIAGNOSIS — E785 Hyperlipidemia, unspecified: Secondary | ICD-10-CM | POA: Diagnosis not present

## 2017-09-18 DIAGNOSIS — F329 Major depressive disorder, single episode, unspecified: Secondary | ICD-10-CM | POA: Diagnosis not present

## 2017-09-18 DIAGNOSIS — F419 Anxiety disorder, unspecified: Secondary | ICD-10-CM | POA: Diagnosis not present

## 2017-09-18 DIAGNOSIS — Z6839 Body mass index (BMI) 39.0-39.9, adult: Secondary | ICD-10-CM | POA: Diagnosis not present

## 2017-09-18 DIAGNOSIS — I10 Essential (primary) hypertension: Secondary | ICD-10-CM | POA: Diagnosis not present

## 2017-09-18 DIAGNOSIS — E669 Obesity, unspecified: Secondary | ICD-10-CM | POA: Diagnosis not present

## 2017-09-18 DIAGNOSIS — H269 Unspecified cataract: Secondary | ICD-10-CM | POA: Diagnosis not present

## 2017-09-18 DIAGNOSIS — Z79899 Other long term (current) drug therapy: Secondary | ICD-10-CM | POA: Diagnosis not present

## 2017-09-21 DIAGNOSIS — F419 Anxiety disorder, unspecified: Secondary | ICD-10-CM | POA: Diagnosis not present

## 2017-09-21 DIAGNOSIS — F5101 Primary insomnia: Secondary | ICD-10-CM | POA: Diagnosis not present

## 2017-09-21 DIAGNOSIS — N183 Chronic kidney disease, stage 3 (moderate): Secondary | ICD-10-CM | POA: Diagnosis not present

## 2017-09-21 DIAGNOSIS — Z23 Encounter for immunization: Secondary | ICD-10-CM | POA: Diagnosis not present

## 2017-09-21 DIAGNOSIS — I10 Essential (primary) hypertension: Secondary | ICD-10-CM | POA: Diagnosis not present

## 2017-11-13 DIAGNOSIS — M810 Age-related osteoporosis without current pathological fracture: Secondary | ICD-10-CM | POA: Diagnosis not present

## 2017-12-04 DIAGNOSIS — R300 Dysuria: Secondary | ICD-10-CM | POA: Diagnosis not present

## 2017-12-04 DIAGNOSIS — N3000 Acute cystitis without hematuria: Secondary | ICD-10-CM | POA: Diagnosis not present

## 2017-12-04 DIAGNOSIS — L304 Erythema intertrigo: Secondary | ICD-10-CM | POA: Diagnosis not present

## 2017-12-11 DIAGNOSIS — L304 Erythema intertrigo: Secondary | ICD-10-CM | POA: Diagnosis not present

## 2017-12-11 DIAGNOSIS — L821 Other seborrheic keratosis: Secondary | ICD-10-CM | POA: Diagnosis not present

## 2017-12-26 DIAGNOSIS — H401494 Capsular glaucoma with pseudoexfoliation of lens, unspecified eye, indeterminate stage: Secondary | ICD-10-CM | POA: Diagnosis not present

## 2018-01-30 DIAGNOSIS — L304 Erythema intertrigo: Secondary | ICD-10-CM | POA: Diagnosis not present

## 2018-01-30 DIAGNOSIS — R102 Pelvic and perineal pain: Secondary | ICD-10-CM | POA: Diagnosis not present

## 2018-01-30 DIAGNOSIS — Z6841 Body Mass Index (BMI) 40.0 and over, adult: Secondary | ICD-10-CM | POA: Diagnosis not present

## 2018-01-30 DIAGNOSIS — N952 Postmenopausal atrophic vaginitis: Secondary | ICD-10-CM | POA: Diagnosis not present

## 2018-01-31 DIAGNOSIS — R102 Pelvic and perineal pain: Secondary | ICD-10-CM | POA: Diagnosis not present

## 2018-02-08 DIAGNOSIS — I8001 Phlebitis and thrombophlebitis of superficial vessels of right lower extremity: Secondary | ICD-10-CM | POA: Diagnosis not present

## 2018-02-20 DIAGNOSIS — M25551 Pain in right hip: Secondary | ICD-10-CM | POA: Diagnosis not present

## 2018-02-20 DIAGNOSIS — I10 Essential (primary) hypertension: Secondary | ICD-10-CM | POA: Diagnosis not present

## 2018-02-20 DIAGNOSIS — Z6841 Body Mass Index (BMI) 40.0 and over, adult: Secondary | ICD-10-CM | POA: Diagnosis not present

## 2018-02-20 DIAGNOSIS — M5416 Radiculopathy, lumbar region: Secondary | ICD-10-CM | POA: Diagnosis not present

## 2018-02-21 ENCOUNTER — Other Ambulatory Visit: Payer: Self-pay | Admitting: Student

## 2018-02-21 DIAGNOSIS — M25551 Pain in right hip: Secondary | ICD-10-CM

## 2018-02-21 DIAGNOSIS — M5416 Radiculopathy, lumbar region: Secondary | ICD-10-CM

## 2018-02-26 ENCOUNTER — Other Ambulatory Visit: Payer: Self-pay | Admitting: Family Medicine

## 2018-02-26 DIAGNOSIS — R109 Unspecified abdominal pain: Secondary | ICD-10-CM

## 2018-02-26 DIAGNOSIS — N183 Chronic kidney disease, stage 3 (moderate): Secondary | ICD-10-CM | POA: Diagnosis not present

## 2018-02-26 DIAGNOSIS — E039 Hypothyroidism, unspecified: Secondary | ICD-10-CM | POA: Diagnosis not present

## 2018-02-26 DIAGNOSIS — E7800 Pure hypercholesterolemia, unspecified: Secondary | ICD-10-CM | POA: Diagnosis not present

## 2018-02-26 DIAGNOSIS — I10 Essential (primary) hypertension: Secondary | ICD-10-CM | POA: Diagnosis not present

## 2018-03-02 ENCOUNTER — Other Ambulatory Visit: Payer: Medicare Other

## 2018-03-05 ENCOUNTER — Ambulatory Visit
Admission: RE | Admit: 2018-03-05 | Discharge: 2018-03-05 | Disposition: A | Discharge status: Home / Self Care | Payer: Medicare Other | Source: Ambulatory Visit | Attending: Student | Admitting: Student

## 2018-03-05 DIAGNOSIS — M25551 Pain in right hip: Secondary | ICD-10-CM | POA: Diagnosis not present

## 2018-03-05 DIAGNOSIS — M5416 Radiculopathy, lumbar region: Secondary | ICD-10-CM

## 2018-03-05 DIAGNOSIS — M48061 Spinal stenosis, lumbar region without neurogenic claudication: Secondary | ICD-10-CM | POA: Diagnosis not present

## 2018-03-06 DIAGNOSIS — H40033 Anatomical narrow angle, bilateral: Secondary | ICD-10-CM | POA: Diagnosis not present

## 2018-03-06 DIAGNOSIS — H04123 Dry eye syndrome of bilateral lacrimal glands: Secondary | ICD-10-CM | POA: Diagnosis not present

## 2018-03-13 ENCOUNTER — Ambulatory Visit
Admission: RE | Admit: 2018-03-13 | Discharge: 2018-03-13 | Disposition: A | Discharge status: Home / Self Care | Payer: Medicare Other | Source: Ambulatory Visit | Attending: Family Medicine | Admitting: Family Medicine

## 2018-03-13 DIAGNOSIS — K8020 Calculus of gallbladder without cholecystitis without obstruction: Secondary | ICD-10-CM | POA: Diagnosis not present

## 2018-03-13 DIAGNOSIS — M4317 Spondylolisthesis, lumbosacral region: Secondary | ICD-10-CM | POA: Diagnosis not present

## 2018-03-13 DIAGNOSIS — M48062 Spinal stenosis, lumbar region with neurogenic claudication: Secondary | ICD-10-CM | POA: Diagnosis not present

## 2018-03-13 DIAGNOSIS — R109 Unspecified abdominal pain: Secondary | ICD-10-CM

## 2018-03-14 ENCOUNTER — Other Ambulatory Visit: Payer: Self-pay | Admitting: Neurosurgery

## 2018-03-14 DIAGNOSIS — M4317 Spondylolisthesis, lumbosacral region: Secondary | ICD-10-CM

## 2018-03-21 ENCOUNTER — Ambulatory Visit
Admission: RE | Admit: 2018-03-21 | Discharge: 2018-03-21 | Disposition: A | Discharge status: Home / Self Care | Payer: Medicare Other | Source: Ambulatory Visit | Attending: Neurosurgery | Admitting: Neurosurgery

## 2018-03-21 ENCOUNTER — Other Ambulatory Visit: Payer: Self-pay | Admitting: Neurosurgery

## 2018-03-21 DIAGNOSIS — M4317 Spondylolisthesis, lumbosacral region: Secondary | ICD-10-CM

## 2018-03-21 DIAGNOSIS — M545 Low back pain: Secondary | ICD-10-CM | POA: Diagnosis not present

## 2018-03-21 MED ORDER — IOPAMIDOL (ISOVUE-M 200) INJECTION 41%
1.0000 mL | Freq: Once | INTRAMUSCULAR | Status: AC
  Administered 2018-03-21: 1 mL via INTRA_ARTICULAR

## 2018-03-21 MED ORDER — METHYLPREDNISOLONE ACETATE 40 MG/ML INJ SUSP (RADIOLOG
120.0000 mg | Freq: Once | INTRAMUSCULAR | Status: AC
  Administered 2018-03-21: 120 mg via INTRA_ARTICULAR

## 2018-03-21 NOTE — Discharge Instructions (Signed)

## 2018-05-15 DIAGNOSIS — M5440 Lumbago with sciatica, unspecified side: Secondary | ICD-10-CM | POA: Diagnosis not present

## 2018-05-18 ENCOUNTER — Other Ambulatory Visit: Payer: Self-pay | Admitting: Neurosurgery

## 2018-05-18 DIAGNOSIS — M5440 Lumbago with sciatica, unspecified side: Secondary | ICD-10-CM

## 2018-05-23 DIAGNOSIS — N183 Chronic kidney disease, stage 3 (moderate): Secondary | ICD-10-CM | POA: Diagnosis not present

## 2018-05-23 DIAGNOSIS — Z6841 Body Mass Index (BMI) 40.0 and over, adult: Secondary | ICD-10-CM | POA: Diagnosis not present

## 2018-05-23 DIAGNOSIS — K219 Gastro-esophageal reflux disease without esophagitis: Secondary | ICD-10-CM | POA: Diagnosis not present

## 2018-05-23 DIAGNOSIS — E7800 Pure hypercholesterolemia, unspecified: Secondary | ICD-10-CM | POA: Diagnosis not present

## 2018-05-23 DIAGNOSIS — M810 Age-related osteoporosis without current pathological fracture: Secondary | ICD-10-CM | POA: Diagnosis not present

## 2018-05-23 DIAGNOSIS — E039 Hypothyroidism, unspecified: Secondary | ICD-10-CM | POA: Diagnosis not present

## 2018-05-23 DIAGNOSIS — I10 Essential (primary) hypertension: Secondary | ICD-10-CM | POA: Diagnosis not present

## 2018-05-23 DIAGNOSIS — Z23 Encounter for immunization: Secondary | ICD-10-CM | POA: Diagnosis not present

## 2018-05-23 DIAGNOSIS — F325 Major depressive disorder, single episode, in full remission: Secondary | ICD-10-CM | POA: Diagnosis not present

## 2018-05-23 DIAGNOSIS — Z1389 Encounter for screening for other disorder: Secondary | ICD-10-CM | POA: Diagnosis not present

## 2018-05-23 DIAGNOSIS — Z0000 Encounter for general adult medical examination without abnormal findings: Secondary | ICD-10-CM | POA: Diagnosis not present

## 2018-08-19 DIAGNOSIS — J019 Acute sinusitis, unspecified: Secondary | ICD-10-CM | POA: Diagnosis not present

## 2018-08-24 DIAGNOSIS — J029 Acute pharyngitis, unspecified: Secondary | ICD-10-CM | POA: Diagnosis not present

## 2018-08-24 DIAGNOSIS — J0100 Acute maxillary sinusitis, unspecified: Secondary | ICD-10-CM | POA: Diagnosis not present

## 2018-09-17 DIAGNOSIS — R0982 Postnasal drip: Secondary | ICD-10-CM | POA: Diagnosis not present

## 2018-09-17 DIAGNOSIS — Z23 Encounter for immunization: Secondary | ICD-10-CM | POA: Diagnosis not present

## 2018-09-17 DIAGNOSIS — M2550 Pain in unspecified joint: Secondary | ICD-10-CM | POA: Diagnosis not present

## 2018-10-16 DIAGNOSIS — E7800 Pure hypercholesterolemia, unspecified: Secondary | ICD-10-CM | POA: Diagnosis not present

## 2018-10-16 DIAGNOSIS — E039 Hypothyroidism, unspecified: Secondary | ICD-10-CM | POA: Diagnosis not present

## 2018-10-16 DIAGNOSIS — R51 Headache: Secondary | ICD-10-CM | POA: Diagnosis not present

## 2018-10-16 DIAGNOSIS — I10 Essential (primary) hypertension: Secondary | ICD-10-CM | POA: Diagnosis not present

## 2018-10-16 DIAGNOSIS — M549 Dorsalgia, unspecified: Secondary | ICD-10-CM | POA: Diagnosis not present

## 2018-10-16 DIAGNOSIS — K219 Gastro-esophageal reflux disease without esophagitis: Secondary | ICD-10-CM | POA: Diagnosis not present

## 2018-10-16 DIAGNOSIS — R5383 Other fatigue: Secondary | ICD-10-CM | POA: Diagnosis not present

## 2018-10-16 DIAGNOSIS — J3489 Other specified disorders of nose and nasal sinuses: Secondary | ICD-10-CM | POA: Diagnosis not present

## 2018-10-26 DIAGNOSIS — Z9889 Other specified postprocedural states: Secondary | ICD-10-CM | POA: Diagnosis not present

## 2018-10-26 DIAGNOSIS — H35371 Puckering of macula, right eye: Secondary | ICD-10-CM | POA: Diagnosis not present

## 2018-10-26 DIAGNOSIS — H401434 Capsular glaucoma with pseudoexfoliation of lens, bilateral, indeterminate stage: Secondary | ICD-10-CM | POA: Diagnosis not present

## 2018-10-30 DIAGNOSIS — H43821 Vitreomacular adhesion, right eye: Secondary | ICD-10-CM | POA: Diagnosis not present

## 2018-10-30 DIAGNOSIS — H35379 Puckering of macula, unspecified eye: Secondary | ICD-10-CM | POA: Diagnosis not present

## 2018-10-30 DIAGNOSIS — H401494 Capsular glaucoma with pseudoexfoliation of lens, unspecified eye, indeterminate stage: Secondary | ICD-10-CM | POA: Diagnosis not present

## 2018-10-30 DIAGNOSIS — Z961 Presence of intraocular lens: Secondary | ICD-10-CM | POA: Diagnosis not present

## 2018-10-31 ENCOUNTER — Ambulatory Visit (INDEPENDENT_AMBULATORY_CARE_PROVIDER_SITE_OTHER): Payer: Medicare Other

## 2018-10-31 ENCOUNTER — Encounter (HOSPITAL_COMMUNITY): Payer: Self-pay | Admitting: Family Medicine

## 2018-10-31 ENCOUNTER — Ambulatory Visit (HOSPITAL_COMMUNITY)
Admission: EM | Admit: 2018-10-31 | Discharge: 2018-10-31 | Disposition: A | Discharge status: Home / Self Care | Payer: Medicare Other | Attending: Family Medicine | Admitting: Family Medicine

## 2018-10-31 DIAGNOSIS — R05 Cough: Secondary | ICD-10-CM | POA: Diagnosis not present

## 2018-10-31 DIAGNOSIS — J40 Bronchitis, not specified as acute or chronic: Secondary | ICD-10-CM | POA: Diagnosis not present

## 2018-10-31 DIAGNOSIS — B9789 Other viral agents as the cause of diseases classified elsewhere: Secondary | ICD-10-CM

## 2018-10-31 DIAGNOSIS — J069 Acute upper respiratory infection, unspecified: Secondary | ICD-10-CM | POA: Insufficient documentation

## 2018-10-31 MED ORDER — PREDNISONE 50 MG PO TABS: ORAL_TABLET | ORAL | 0 refills | Status: DC

## 2018-10-31 MED ORDER — BENZONATATE 100 MG PO CAPS: 100.0000 mg | ORAL_CAPSULE | Freq: Three times a day (TID) | ORAL | 0 refills | Status: DC | PRN

## 2018-10-31 NOTE — Discharge Instructions (Signed)
You should start feeling better in a day or two.  This bronchitis is caused by a virus.  No pneumonia is seen on the x-ray

## 2018-10-31 NOTE — ED Triage Notes (Signed)
Pt here for fever and URI sx x 4 days

## 2018-10-31 NOTE — ED Provider Notes (Signed)
[facility]    CSN: [id number] Arrival date & time: 10/31/18  1045     History   Chief Complaint Chief Complaint  Patient presents with  . URI    HPI Tammie [name] is a 77 y.o. female.   Is a 77 year old woman, established patient at [facility] urgent care, who presents with upper respiratory symptoms including fever.  Been sick for about 4 days.  She has no history of asthma and does not smoke.  Cough is productive.  She has some left ear pain as well.  She is a little bit short of breath.  She took some Tylenol this morning to control the fever.     Past Medical History:  Diagnosis Date  . CKD (chronic kidney disease), stage III ([place])   . Colon adenoma   . Depression   . Glaucoma   . HTN (hypertension)   . Hypothyroidism   . Obesity   . Obesity hypoventilation syndrome ([place])   . Renal cell cancer ([place])    a. s/p right nephrectomy.    Patient Active Problem List   Diagnosis Date Noted  . Pseudophakia of both eyes 05/24/2016  . On supplemental oxygen therapy 05/16/2016  . Age-related nuclear cataract of left eye 05/03/2016  . Generalized anxiety disorder 04/18/2016  . Solitary kidney, acquired 02/18/2016  . Glaucoma associated with ocular inflammation, unspecified laterality, stage unspecified 10/21/2014  . Ankle fracture, bimalleolar, closed 08/31/2014  . CKD (chronic kidney disease) stage 2, GFR 60-89 ml/min 08/31/2014  . Uncontrolled pain 08/31/2014  . Hypothyroidism 08/31/2014  . Nocturnal hypoxemia 03/28/2014  . Renal cell carcinoma of right kidney ([place]) 02/06/2014  . Nephrolithiasis 02/06/2014  . GERD (gastroesophageal reflux disease) 02/06/2014  . Peripheral visual field defect 12/09/2013  . 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 Nuclear sclerotic cataract of left eye 10/02/2012    Past Surgical History:  Procedure Laterality Date  . JOINT REPLACEMENT    . KIDNEY SURGERY    . ORIF ANKLE FRACTURE Right 09/01/2014   Procedure:  OPEN REDUCTION INTERNAL FIXATION (ORIF) ANKLE FRACTURE;  Surgeon: [name], MD;  Location: [facility];  Service: Orthopedics;  Laterality: Right;  . REPLACEMENT TOTAL KNEE    . TOTAL ABDOMINAL HYSTERECTOMY      OB History   No obstetric history on file.      Home Medications    Prior to Admission medications   Medication Sig Start Date End Date Taking? Authorizing Provider  alprazolam Duanne Moron) 2 MG tablet Take 2 mg by mouth at bedtime.     [provider]  amLODipine (NORVASC) 2.5 MG tablet Take 2.5 mg by mouth daily.  01/23/17   [provider]  Ascorbic Acid (VITAMIN C) 500 MG CAPS Take 500 mg by mouth daily.     [provider]  benzonatate (TESSALON) 100 MG capsule Take 1-2 capsules (100-200 mg total) by mouth 3 (three) times daily as needed for cough. 10/31/18   Robyn Haber, MD  Calcium Carb-Cholecalciferol (CALCIUM + D3) 600-200 MG-UNIT TABS Take 1 tablet by mouth 2 (two) times daily.     [provider]  FLUoxetine (PROZAC) 20 MG capsule Take 20 mg by mouth every morning.  03/11/14   [provider]  furosemide (LASIX) 20 MG tablet Take 20 mg by mouth every other day.    [provider]  Glucosamine-Chondroitin (GLUCOSAMINE CHONDR COMPLEX PO) Take 1 tablet by mouth 2 (two) times daily.     [provider]  latanoprost (XALATAN) 0.005 % ophthalmic solution Place 1 drop into the left eye at bedtime.     [provider]  levothyroxine (SYNTHROID, LEVOTHROID) 75 MCG tablet Take 75 mcg by mouth daily before breakfast.    [provider]  losartan (COZAAR) 100 MG tablet Take 100 mg by mouth every morning.  03/14/14   [provider]  Multiple Vitamin (MULTI-VITAMINS) TABS Take 1 tablet by mouth daily.     [provider]  omeprazole (PRILOSEC) 20 MG capsule Take by mouth daily.     [provider]  predniSONE (DELTASONE) 50 MG tablet One daily with food 10/31/18   Robyn Haber, MD  traZODone (DESYREL) 50 MG tablet Take 1 tablet by mouth at bedtime.  03/11/14   [provider]    Family History Family History  Problem Relation Age of Onset  . Heart disease Father        due to small pox as a [place]  . Lung cancer Paternal Grandfather        lived to age 54    Social History Social History   Tobacco Use  . Smoking status: Never Smoker  . Smokeless tobacco: Never Used  Substance Use Topics  . Alcohol use: No  . Drug use: No     Allergies   Levaquin [levofloxacin]; Nsaids; Hydrocodone-acetaminophen; Oxycodone; and Quinolones   Review of Systems Review of Systems  HENT: Positive for ear pain.   Respiratory: Positive for chest tightness, shortness of breath and wheezing.      Physical Exam Triage Vital Signs ED Triage Vitals [10/31/18 1156]  Enc Vitals Group     BP 122/61     Pulse Rate 84     Resp 18     Temp 98.3 F (36.8 C)     Temp Source Oral     SpO2 94 %     Weight      Height      Head Circumference      Peak Flow      Pain Score 5     Pain Loc      Pain Edu?      Excl. in [place]?    No data found.  Updated Vital Signs BP 122/61 (BP Location: Right Arm)   Pulse 84   Temp 98.3 F (36.8 C) (Oral)   Resp 18   SpO2 94%    Physical Exam Vitals signs and nursing note reviewed.  Constitutional:      Appearance: Normal appearance. She is obese.  HENT:     Head: Normocephalic.     Right Ear: Tympanic membrane and external ear normal.     Left Ear: Tympanic membrane and external ear normal.     Nose: Nose normal.     Mouth/Throat:     Mouth: Mucous membranes are moist.  Eyes:     Conjunctiva/sclera: Conjunctivae normal.     Pupils: Pupils are equal, round, and reactive to light.  Neck:     Musculoskeletal: Normal range of motion and neck supple.  Cardiovascular:     Rate and Rhythm: Normal rate.     Heart sounds: Normal heart sounds.  Pulmonary:     Effort: Pulmonary effort is normal.     Breath  sounds: Wheezing present.  Skin:    General: Skin is warm and dry.  Neurological:     General: No focal deficit present.     Mental Status: She is alert.  Psychiatric:  Mood and Affect: Mood normal.      UC Treatments / Results  Labs (all labs ordered are listed, but only abnormal results are displayed) Labs Reviewed - No data to display  EKG None  Radiology Dg Chest 2 View  Result Date: 10/31/2018 CLINICAL DATA:  Cough over the last 4 days. Chest congestion and wheezing. EXAM: CHEST - 2 VIEW COMPARISON:  08/09/2015 FINDINGS: Heart size is normal. Central bronchial thickening consistent with bronchitis. No infiltrate, collapse or effusion. Chronic spinal curvature and degenerative change. IMPRESSION: Bronchitis pattern. No consolidation or collapse. Electronically Signed   By: Nelson Chimes M.D.   On: 10/31/2018 12:50    Procedures Procedures (including critical care time)  Medications Ordered in UC Medications - No data to display  Initial Impression / Assessment and Plan / UC Course  I have reviewed the triage vital signs and the nursing notes.  Pertinent labs & imaging results that were available during my care of the patient were reviewed by me and considered in my medical decision making (see chart for details).    Final Clinical Impressions(s) / UC Diagnoses   Final diagnoses:  Viral URI with cough  Bronchitis     Discharge Instructions     You should start feeling better in a day or two.  This bronchitis is caused by a virus.  No pneumonia is seen on the x-ray    ED Prescriptions    Medication Sig Dispense Auth. Provider   predniSONE (DELTASONE) 50 MG tablet One daily with food 5 tablet Robyn Haber, MD   benzonatate (TESSALON) 100 MG capsule Take 1-2 capsules (100-200 mg total) by mouth 3 (three) times daily as needed for cough. 40 capsule Robyn Haber, MD     Controlled Substance Prescriptions [place] Controlled Substance Registry  consulted? Not Applicable   Robyn Haber, MD 10/31/18 [phone]

## 2018-11-20 DIAGNOSIS — M545 Low back pain: Secondary | ICD-10-CM | POA: Diagnosis not present

## 2018-11-20 DIAGNOSIS — M546 Pain in thoracic spine: Secondary | ICD-10-CM | POA: Diagnosis not present

## 2018-11-22 DIAGNOSIS — J0101 Acute recurrent maxillary sinusitis: Secondary | ICD-10-CM | POA: Diagnosis not present

## 2018-11-22 DIAGNOSIS — N183 Chronic kidney disease, stage 3 (moderate): Secondary | ICD-10-CM | POA: Diagnosis not present

## 2018-11-28 DIAGNOSIS — M810 Age-related osteoporosis without current pathological fracture: Secondary | ICD-10-CM | POA: Diagnosis not present

## 2019-03-11 DIAGNOSIS — M545 Low back pain: Secondary | ICD-10-CM | POA: Diagnosis not present

## 2019-04-23 DIAGNOSIS — N183 Chronic kidney disease, stage 3 (moderate): Secondary | ICD-10-CM | POA: Diagnosis not present

## 2019-04-23 DIAGNOSIS — E039 Hypothyroidism, unspecified: Secondary | ICD-10-CM | POA: Diagnosis not present

## 2019-04-23 DIAGNOSIS — R350 Frequency of micturition: Secondary | ICD-10-CM | POA: Diagnosis not present

## 2019-04-25 DIAGNOSIS — N183 Chronic kidney disease, stage 3 (moderate): Secondary | ICD-10-CM | POA: Diagnosis not present

## 2019-04-25 DIAGNOSIS — E039 Hypothyroidism, unspecified: Secondary | ICD-10-CM | POA: Diagnosis not present

## 2019-04-25 DIAGNOSIS — R350 Frequency of micturition: Secondary | ICD-10-CM | POA: Diagnosis not present

## 2019-04-25 DIAGNOSIS — I10 Essential (primary) hypertension: Secondary | ICD-10-CM | POA: Diagnosis not present

## 2019-05-06 DIAGNOSIS — F5101 Primary insomnia: Secondary | ICD-10-CM | POA: Diagnosis not present

## 2019-05-06 DIAGNOSIS — R0602 Shortness of breath: Secondary | ICD-10-CM | POA: Diagnosis not present

## 2019-05-06 DIAGNOSIS — F3341 Major depressive disorder, recurrent, in partial remission: Secondary | ICD-10-CM | POA: Diagnosis not present

## 2019-05-08 DIAGNOSIS — Z79899 Other long term (current) drug therapy: Secondary | ICD-10-CM | POA: Diagnosis not present

## 2019-05-08 DIAGNOSIS — R0789 Other chest pain: Secondary | ICD-10-CM | POA: Diagnosis not present

## 2019-05-08 DIAGNOSIS — R911 Solitary pulmonary nodule: Secondary | ICD-10-CM | POA: Diagnosis not present

## 2019-05-08 DIAGNOSIS — Z6841 Body Mass Index (BMI) 40.0 and over, adult: Secondary | ICD-10-CM | POA: Diagnosis not present

## 2019-05-08 DIAGNOSIS — Z1159 Encounter for screening for other viral diseases: Secondary | ICD-10-CM | POA: Diagnosis not present

## 2019-05-08 DIAGNOSIS — E039 Hypothyroidism, unspecified: Secondary | ICD-10-CM | POA: Diagnosis not present

## 2019-05-08 DIAGNOSIS — I5032 Chronic diastolic (congestive) heart failure: Secondary | ICD-10-CM | POA: Diagnosis not present

## 2019-05-08 DIAGNOSIS — Z881 Allergy status to other antibiotic agents status: Secondary | ICD-10-CM | POA: Diagnosis not present

## 2019-05-08 DIAGNOSIS — F419 Anxiety disorder, unspecified: Secondary | ICD-10-CM | POA: Diagnosis not present

## 2019-05-08 DIAGNOSIS — Z905 Acquired absence of kidney: Secondary | ICD-10-CM | POA: Diagnosis not present

## 2019-05-08 DIAGNOSIS — R531 Weakness: Secondary | ICD-10-CM | POA: Diagnosis not present

## 2019-05-08 DIAGNOSIS — N183 Chronic kidney disease, stage 3 (moderate): Secondary | ICD-10-CM | POA: Diagnosis not present

## 2019-05-08 DIAGNOSIS — R935 Abnormal findings on diagnostic imaging of other abdominal regions, including retroperitoneum: Secondary | ICD-10-CM | POA: Diagnosis not present

## 2019-05-08 DIAGNOSIS — E785 Hyperlipidemia, unspecified: Secondary | ICD-10-CM | POA: Diagnosis not present

## 2019-05-08 DIAGNOSIS — I2510 Atherosclerotic heart disease of native coronary artery without angina pectoris: Secondary | ICD-10-CM | POA: Diagnosis not present

## 2019-05-08 DIAGNOSIS — R918 Other nonspecific abnormal finding of lung field: Secondary | ICD-10-CM | POA: Diagnosis not present

## 2019-05-08 DIAGNOSIS — K219 Gastro-esophageal reflux disease without esophagitis: Secondary | ICD-10-CM | POA: Diagnosis not present

## 2019-05-08 DIAGNOSIS — Z85528 Personal history of other malignant neoplasm of kidney: Secondary | ICD-10-CM | POA: Diagnosis not present

## 2019-05-08 DIAGNOSIS — I10 Essential (primary) hypertension: Secondary | ICD-10-CM | POA: Diagnosis not present

## 2019-05-08 DIAGNOSIS — Z886 Allergy status to analgesic agent status: Secondary | ICD-10-CM | POA: Diagnosis not present

## 2019-05-08 DIAGNOSIS — R0602 Shortness of breath: Secondary | ICD-10-CM | POA: Diagnosis not present

## 2019-05-08 DIAGNOSIS — Z20828 Contact with and (suspected) exposure to other viral communicable diseases: Secondary | ICD-10-CM | POA: Diagnosis not present

## 2019-05-08 DIAGNOSIS — Z9981 Dependence on supplemental oxygen: Secondary | ICD-10-CM | POA: Diagnosis not present

## 2019-05-08 DIAGNOSIS — K449 Diaphragmatic hernia without obstruction or gangrene: Secondary | ICD-10-CM | POA: Diagnosis not present

## 2019-05-08 DIAGNOSIS — I130 Hypertensive heart and chronic kidney disease with heart failure and stage 1 through stage 4 chronic kidney disease, or unspecified chronic kidney disease: Secondary | ICD-10-CM | POA: Diagnosis not present

## 2019-05-08 DIAGNOSIS — Z7901 Long term (current) use of anticoagulants: Secondary | ICD-10-CM | POA: Diagnosis not present

## 2019-05-09 DIAGNOSIS — Z6841 Body Mass Index (BMI) 40.0 and over, adult: Secondary | ICD-10-CM | POA: Diagnosis not present

## 2019-05-09 DIAGNOSIS — R0789 Other chest pain: Secondary | ICD-10-CM | POA: Diagnosis not present

## 2019-05-09 DIAGNOSIS — R079 Chest pain, unspecified: Secondary | ICD-10-CM | POA: Diagnosis not present

## 2019-05-09 DIAGNOSIS — C641 Malignant neoplasm of right kidney, except renal pelvis: Secondary | ICD-10-CM | POA: Diagnosis not present

## 2019-05-09 DIAGNOSIS — R0602 Shortness of breath: Secondary | ICD-10-CM | POA: Diagnosis not present

## 2019-05-09 DIAGNOSIS — E039 Hypothyroidism, unspecified: Secondary | ICD-10-CM | POA: Diagnosis not present

## 2019-05-09 DIAGNOSIS — I10 Essential (primary) hypertension: Secondary | ICD-10-CM | POA: Diagnosis not present

## 2019-05-10 DIAGNOSIS — C641 Malignant neoplasm of right kidney, except renal pelvis: Secondary | ICD-10-CM | POA: Diagnosis not present

## 2019-05-10 DIAGNOSIS — R079 Chest pain, unspecified: Secondary | ICD-10-CM | POA: Diagnosis not present

## 2019-05-10 DIAGNOSIS — I10 Essential (primary) hypertension: Secondary | ICD-10-CM | POA: Diagnosis not present

## 2019-05-10 DIAGNOSIS — R0602 Shortness of breath: Secondary | ICD-10-CM | POA: Diagnosis not present

## 2019-05-10 DIAGNOSIS — Z6841 Body Mass Index (BMI) 40.0 and over, adult: Secondary | ICD-10-CM | POA: Diagnosis not present

## 2019-05-10 DIAGNOSIS — E039 Hypothyroidism, unspecified: Secondary | ICD-10-CM | POA: Diagnosis not present

## 2019-05-29 DIAGNOSIS — N183 Chronic kidney disease, stage 3 (moderate): Secondary | ICD-10-CM | POA: Diagnosis not present

## 2019-05-29 DIAGNOSIS — R0602 Shortness of breath: Secondary | ICD-10-CM | POA: Diagnosis not present

## 2019-05-29 DIAGNOSIS — K219 Gastro-esophageal reflux disease without esophagitis: Secondary | ICD-10-CM | POA: Diagnosis not present

## 2019-05-29 DIAGNOSIS — Z6841 Body Mass Index (BMI) 40.0 and over, adult: Secondary | ICD-10-CM | POA: Diagnosis not present

## 2019-05-29 DIAGNOSIS — Z9981 Dependence on supplemental oxygen: Secondary | ICD-10-CM | POA: Diagnosis not present

## 2019-05-29 DIAGNOSIS — I10 Essential (primary) hypertension: Secondary | ICD-10-CM | POA: Diagnosis not present

## 2019-05-29 DIAGNOSIS — C641 Malignant neoplasm of right kidney, except renal pelvis: Secondary | ICD-10-CM | POA: Diagnosis not present

## 2019-05-29 DIAGNOSIS — R0789 Other chest pain: Secondary | ICD-10-CM | POA: Diagnosis not present

## 2019-06-04 DIAGNOSIS — H43821 Vitreomacular adhesion, right eye: Secondary | ICD-10-CM | POA: Diagnosis not present

## 2019-06-04 DIAGNOSIS — Z961 Presence of intraocular lens: Secondary | ICD-10-CM | POA: Diagnosis not present

## 2019-06-04 DIAGNOSIS — H35379 Puckering of macula, unspecified eye: Secondary | ICD-10-CM | POA: Diagnosis not present

## 2019-06-04 DIAGNOSIS — H401494 Capsular glaucoma with pseudoexfoliation of lens, unspecified eye, indeterminate stage: Secondary | ICD-10-CM | POA: Diagnosis not present

## 2019-06-10 DIAGNOSIS — I5032 Chronic diastolic (congestive) heart failure: Secondary | ICD-10-CM | POA: Diagnosis not present

## 2019-06-10 DIAGNOSIS — F5101 Primary insomnia: Secondary | ICD-10-CM | POA: Diagnosis not present

## 2019-06-10 DIAGNOSIS — G4734 Idiopathic sleep related nonobstructive alveolar hypoventilation: Secondary | ICD-10-CM | POA: Diagnosis not present

## 2019-06-10 DIAGNOSIS — I10 Essential (primary) hypertension: Secondary | ICD-10-CM | POA: Diagnosis not present

## 2019-06-10 DIAGNOSIS — F419 Anxiety disorder, unspecified: Secondary | ICD-10-CM | POA: Diagnosis not present

## 2019-06-10 DIAGNOSIS — N183 Chronic kidney disease, stage 3 (moderate): Secondary | ICD-10-CM | POA: Diagnosis not present

## 2019-06-10 DIAGNOSIS — M810 Age-related osteoporosis without current pathological fracture: Secondary | ICD-10-CM | POA: Diagnosis not present

## 2019-06-13 ENCOUNTER — Telehealth: Payer: Self-pay

## 2019-06-13 NOTE — Telephone Encounter (Signed)
NOTES ON FILE FROM DR [facility] REFERRAL IN PROFICIENT

## 2019-07-02 ENCOUNTER — Telehealth: Payer: Self-pay | Admitting: Cardiology

## 2019-07-02 NOTE — Telephone Encounter (Signed)
New Message   Patient is requesting to switch from Dr. Marlou Porch to Dr. Percival Spanish.

## 2019-07-02 NOTE — Telephone Encounter (Signed)
Her was an as needed follow up.  Is there a new indication.  I would see her if she has a new indication and needs to come to the [facility] clinic.

## 2019-07-02 NOTE — Telephone Encounter (Signed)
Okay with me if she needs to see cardiology for follow-up Tammie Furbish, MD

## 2019-07-08 DIAGNOSIS — Z23 Encounter for immunization: Secondary | ICD-10-CM | POA: Diagnosis not present

## 2019-07-08 DIAGNOSIS — F3341 Major depressive disorder, recurrent, in partial remission: Secondary | ICD-10-CM | POA: Diagnosis not present

## 2019-07-08 DIAGNOSIS — N183 Chronic kidney disease, stage 3 (moderate): Secondary | ICD-10-CM | POA: Diagnosis not present

## 2019-07-10 NOTE — Progress Notes (Signed)
Cardiology Office Note   Date:  07/11/2019   ID:  SHARAYA PUJOLS, DOB 06/03/1941, MRN [id number]  PCP:  Carol Ada, MD  Cardiologist:   No primary care provider on file. Referring:  Carol Ada, MD  Chief Complaint  Patient presents with  . Shortness of Breath      History of Present Illness: Tammie [name] is a 78 y.o. female who presents for evaluation of acute on chronic diastolic heart failure.  This is her first visit with me.  She previously saw Dr. Marlou Porch.  She had chest pain but she has no ischemia on a stress test and she had a normal EF.  She was in the hospital in June in [place].  She was visiting her daughter and she started having shortness of breath.  She went to the emergency room and was found to have acute heart failure.  She was admitted and I was able to review records in [place].  She had some evidence of diastolic dysfunction on echo.  She had normal systolic function.  She had normal valves.  She had a perfusion study that was unremarkable.  She was managed with diuresis.  She only has 1 kidney but she has had stable renal function most recently.  Since getting out of the hospital she is taking her medications which was increased by the addition of carvedilol.  She is been very vigilant about watching her salt.  She has not yet been weighing herself daily but she is taking her weight occasionally is been stable.  Her blood pressure apparently has been well controlled although she is not taking this routinely.  She is not having any palpitations, presyncope or syncope.  Is not been having any chest discomfort, neck or arm discomfort.  Said no swelling.  She has had a little bit of a memory disorder which seems to be new and could be related to the beta-blocker.   Past Medical History:  Diagnosis Date  . CKD (chronic kidney disease), stage III ([place])   . Colon adenoma   . Depression   . Glaucoma   . HTN (hypertension)   . Hypothyroidism   .  Obesity   . Obesity hypoventilation syndrome ([place])   . Renal cell cancer ([place])    a. s/p right nephrectomy.    Past Surgical History:  Procedure Laterality Date  . JOINT REPLACEMENT    . KIDNEY SURGERY    . ORIF ANKLE FRACTURE Right 09/01/2014   Procedure: OPEN REDUCTION INTERNAL FIXATION (ORIF) ANKLE FRACTURE;  Surgeon: [name], MD;  Location: [facility];  Service: Orthopedics;  Laterality: Right;  . REPLACEMENT TOTAL KNEE    . TOTAL ABDOMINAL HYSTERECTOMY       Current Outpatient Medications  Medication Sig Dispense Refill  . alprazolam (XANAX) 2 MG tablet Take 2 mg by mouth at bedtime.     Marland Kitchen amLODipine (NORVASC) 2.5 MG tablet Take 2.5 mg by mouth daily.     . Ascorbic Acid (VITAMIN C) 500 MG CAPS Take 500 mg by mouth daily.     . Calcium Carb-Cholecalciferol (CALCIUM + D3) 600-200 MG-UNIT TABS Take 1 tablet by mouth 2 (two) times daily.     . furosemide (LASIX) 20 MG tablet Take 20 mg by mouth 3 (three) times a week. Mondays, Wednesdays, Fridays    . Glucosamine-Chondroitin (GLUCOSAMINE CHONDR COMPLEX PO) Take 1 tablet by mouth 2 (two) times daily.     Marland Kitchen levothyroxine (SYNTHROID, LEVOTHROID) 75 MCG  tablet Take 75 mcg by mouth daily before breakfast.    . losartan (COZAAR) 100 MG tablet Take 100 mg by mouth every morning.     . Multiple Vitamin (MULTI-VITAMINS) TABS Take 1 tablet by mouth daily.     Marland Kitchen omeprazole (PRILOSEC) 20 MG capsule Take by mouth daily.     . traZODone (DESYREL) 50 MG tablet Take 1 tablet by mouth at bedtime.     . carvedilol (COREG) 3.125 MG tablet Take 1 tablet by mouth 2 (two) times daily.     No current facility-administered medications for this visit.     Allergies:   Levaquin [levofloxacin], Nsaids, Hydrocodone-acetaminophen, Oxycodone, and Quinolones    ROS:  Please see the history of present illness.   Otherwise, review of systems are positive for none.   All other systems are reviewed and negative.    PHYSICAL EXAM: VS:  BP 130/62    Pulse 63   Temp 97.7 F (36.5 C) (Temporal)   Ht 4' 10.25" (1.48 m)   Wt 189 lb 9.6 oz (86 kg)   SpO2 95%   BMI 39.29 kg/m  , BMI Body mass index is 39.29 kg/m. GENERAL:  Well appearing HEENT:  Pupils equal round and reactive, fundi not visualized, oral mucosa unremarkable NECK:  No jugular venous distention, waveform within normal limits, carotid upstroke brisk and symmetric, no bruits, no thyromegaly LYMPHATICS:  No cervical, inguinal adenopathy LUNGS:  Clear to auscultation bilaterally BACK:  No CVA tenderness CHEST:  Unremarkable HEART:  PMI not displaced or sustained,S1 and S2 within normal limits, no S3, no S4, no clicks, no rubs, no murmurs ABD:  Flat, positive bowel sounds normal in frequency in pitch, no bruits, no rebound, no guarding, no midline pulsatile mass, no hepatomegaly, no splenomegaly EXT:  2 plus pulses throughout, no edema, no cyanosis no clubbing SKIN:  No rashes no nodules NEURO:  Cranial nerves II through XII grossly intact, motor grossly intact throughout PSYCH:  Cognitively intact, oriented to person place and time    EKG:  EKG is ordered today. The ekg ordered today demonstrates sinus rhythm, rate 63, axis within normal limits, intervals within normal limits, no acute ST-T wave changes.   Recent Labs: No results found for requested labs within last 8760 hours.    Lipid Panel No results found for: CHOL, TRIG, HDL, CHOLHDL, VLDL, LDLCALC, LDLDIRECT    Wt Readings from Last 3 Encounters:  07/11/19 189 lb 9.6 oz (86 kg)  07/10/17 186 lb 12.8 oz (84.7 kg)  03/10/17 183 lb 12.8 oz (83.4 kg)      Other studies Reviewed: Additional studies/ records that were reviewed today include: Extensive review of [facility] records. Review of the above records demonstrates:  Please see elsewhere in the note.     ASSESSMENT AND PLAN:  Acute Diastolic HF: This is her first post hospital visit for acute diastolic heart failure.  She is euvolemic now.  We extensively  discussed the pathophysiology of this.  She is here with her daughter.  The patient is very bright and understands salt and fluid management.  We talked about daily weights.  She can continue current dose of diuretic with PRN dosing discussed.  No further work-up is suggested at this point.  Essential hypertension Blood pressures currently controlled.  No change in therapy.  Obesity We talked about weight management.  She said she was aware of this and is slowly working on this.  Memory She recently changed her Prozac which could have  something to do with this.  I asked her to discuss with her primary provider.  If however this gets worse I probably would stop the carvedilol and increase her amlodipine.  Current medicines are reviewed at length with the patient today.  The patient does not have concerns regarding medicines.  The following changes have been made:  no change  Labs/ tests ordered today include: None No orders of the defined types were placed in this encounter.    Disposition:   FU with me in 3 months.     Signed, Minus Breeding, MD  07/11/2019 3:25 PM    [facility]

## 2019-07-11 ENCOUNTER — Other Ambulatory Visit: Payer: Self-pay

## 2019-07-11 ENCOUNTER — Encounter: Payer: Self-pay | Admitting: Cardiology

## 2019-07-11 ENCOUNTER — Ambulatory Visit (INDEPENDENT_AMBULATORY_CARE_PROVIDER_SITE_OTHER): Payer: Medicare Other | Admitting: Cardiology

## 2019-07-11 VITALS — BP 130/62 | HR 63 | Temp 97.7°F | Ht 58.25 in | Wt 189.6 lb

## 2019-07-11 DIAGNOSIS — I5031 Acute diastolic (congestive) heart failure: Secondary | ICD-10-CM

## 2019-07-11 DIAGNOSIS — N183 Chronic kidney disease, stage 3 unspecified: Secondary | ICD-10-CM

## 2019-07-11 DIAGNOSIS — I10 Essential (primary) hypertension: Secondary | ICD-10-CM | POA: Diagnosis not present

## 2019-07-11 NOTE — Patient Instructions (Signed)
Medication Instructions:  Your physician recommends that you continue on your current medications as directed. Please refer to the Current Medication list given to you today.  If you need a refill on your cardiac medications before your next appointment, please call your pharmacy.   Lab work: NONE   Testing/Procedures: NONE  Follow-Up: At [facility], you and your health needs are our priority.  As part of our continuing mission to provide you with exceptional heart care, we have created designated Provider Care Teams.  These Care Teams include your primary Cardiologist (physician) and Advanced Practice Providers (APPs -  Physician Assistants and Nurse Practitioners) who all work together to provide you with the care you need, when you need it. You will need a follow up appointment in 4 months.  Please call our office 2 months in advance to schedule this appointment.  You may see DR [facility]  or one of the following Advanced Practice Providers on your designated Care Team:   Rosaria Ferries, PA-C . Jory Sims, DNP, ANP

## 2019-07-30 ENCOUNTER — Telehealth: Payer: Self-pay | Admitting: Cardiology

## 2019-07-30 NOTE — Telephone Encounter (Signed)
New Message    Pt c/o Shortness Of Breath: STAT if SOB developed within the last 24 hours or pt is noticeably SOB on the phone  1. Are you currently SOB (can you hear that pt is SOB on the phone)? Yes   2. How long have you been experiencing SOB? 2 to 3 days   3. Are you SOB when sitting or when up moving around? Moving around  4. Are you currently experiencing any other symptoms?  Patient states she has a heaviness in her chest, no pain.  ?

## 2019-07-30 NOTE — Telephone Encounter (Signed)
called Tammie [name], she states that she has been having some issues with SOB for the last few days- she had issues bad the other night and thought of going to ED but did not, she states it is better today but states she is still not right. She denies checking her blood pressure at home but has not had any issues with it in the past. She states the only other symptom she has is feeling her heart beating fast at nighttime when she lays down to go to sleep. Tammie [name] advised I would notify Dr.Hochrein for any advice on this matter, as he just seen her last month. Tammie [name] thankful for the call.

## 2019-08-01 NOTE — Telephone Encounter (Signed)
Spoke with patient to follow up. About 1 week ago she has some days she does not feel well at all and the next day she feels good. She has decreased energy, fatigue, headache, indigestion at times, and elevated heart rate when she is up moving around. She does have shortness of breath going up the steps or when she is doing things quickly. Denies and fever, cough, swelling, or weight gain. She is unable to determine if she feels better on days she takes her Lasix. Will forward to Dr Percival Spanish for review

## 2019-08-01 NOTE — Telephone Encounter (Signed)
Called patient to follow up, Left message to call back

## 2019-08-08 NOTE — Telephone Encounter (Signed)
Can she give [place] some readings of her BP over the next week.  I could reduce her meds if she has a lower BP.

## 2019-08-09 NOTE — Telephone Encounter (Signed)
Left message to call back  

## 2019-08-18 NOTE — Progress Notes (Signed)
Cardiology Office Note   Date:  08/19/2019   ID:  Tammie [name], DOB 12-04-1940, MRN [id number]  PCP:  Carol Ada, MD  Cardiologist:   No primary care provider on file. Referring:  Carol Ada, MD  Chief Complaint  Patient presents with  . Shortness of Breath      History of Present Illness: Tammie [name] is a 78 y.o. female who presents for evaluation of acute on chronic diastolic heart failure.   She did call with increased SOB.   She says that if she gets up to move quickly like getting the doorbell or going upstairs she will get short of breath and breathing quickly.  Her heart rate will be pounding.  She does have some discomfort in her chest.  This is sharp.  It happens sporadically.  It lasts for 2 to 3 minutes.  She says that she does not really think this stuff is much different than what she had in June when she was hospitalized in [place] with acute diastolic heart failure.  At that time she had an echo with normal systolic function and normal valves.  She had a perfusion study that was negative.  She has been managed with diuretics.  Only has 1 kidney with stable renal function.  She has had a little bit of a memory disorder which seems to be new and could be related to the beta-blocker.  We talked about the fact that this could have been Prozac.  She actually stopped taking it on her own and her memory apparently is better.   Past Medical History:  Diagnosis Date  . CKD (chronic kidney disease), stage III   . Colon adenoma   . Depression   . Glaucoma   . HTN (hypertension)   . Hypothyroidism   . Obesity   . Obesity hypoventilation syndrome ([place])   . Renal cell cancer ([place])    a. s/p right nephrectomy.    Past Surgical History:  Procedure Laterality Date  . JOINT REPLACEMENT    . KIDNEY SURGERY    . ORIF ANKLE FRACTURE Right 09/01/2014   Procedure: OPEN REDUCTION INTERNAL FIXATION (ORIF) ANKLE FRACTURE;  Surgeon: [name], MD;   Location: [facility];  Service: Orthopedics;  Laterality: Right;  . REPLACEMENT TOTAL KNEE    . TOTAL ABDOMINAL HYSTERECTOMY       Current Outpatient Medications  Medication Sig Dispense Refill  . alprazolam (XANAX) 2 MG tablet Take 1 mg by mouth at bedtime.     Marland Kitchen amLODipine (NORVASC) 2.5 MG tablet Take 2.5 mg by mouth daily.     . Ascorbic Acid (VITAMIN C) 500 MG CAPS Take 500 mg by mouth daily.     . Calcium Carb-Cholecalciferol (CALCIUM + D3) 600-200 MG-UNIT TABS Take 1 tablet by mouth 2 (two) times daily.     . carvedilol (COREG) 3.125 MG tablet Take 1 tablet by mouth 2 (two) times daily.    . furosemide (LASIX) 20 MG tablet Take 20 mg by mouth 3 (three) times a week. Mondays, Wednesdays, Fridays    . Glucosamine-Chondroitin (GLUCOSAMINE CHONDR COMPLEX PO) Take 1 tablet by mouth 2 (two) times daily.     Marland Kitchen levothyroxine (SYNTHROID, LEVOTHROID) 75 MCG tablet Take 75 mcg by mouth daily before breakfast.    . losartan (COZAAR) 100 MG tablet Take 100 mg by mouth every morning.     . Multiple Vitamin (MULTI-VITAMINS) TABS Take 1 tablet by mouth daily.     Marland Kitchen  omeprazole (PRILOSEC) 20 MG capsule Take by mouth daily.     . traZODone (DESYREL) 50 MG tablet Take 1 tablet by mouth at bedtime.     . vitamin B-12 (CYANOCOBALAMIN) 500 MCG tablet Take 500 mcg by mouth daily.     No current facility-administered medications for this visit.     Allergies:   Levaquin [levofloxacin], Nsaids, Hydrocodone-acetaminophen, Oxycodone, and Quinolones    ROS:  Please see the history of present illness.   Otherwise, review of systems are positive for none.   All other systems are reviewed and negative.    PHYSICAL EXAM: VS:  BP 138/68   Pulse (!) 54   Temp (!) 97.4 F (36.3 C)   Ht 4' 10.25" (1.48 m)   Wt 192 lb 6.4 oz (87.3 kg)   SpO2 94%   BMI 39.87 kg/m  , BMI Body mass index is 39.87 kg/m. Marland KitchenGENERAL:  Well appearing NECK:  No jugular venous distention, waveform within normal limits, carotid upstroke  brisk and symmetric, no bruits, no thyromegaly LUNGS:  Clear to auscultation bilaterally CHEST:  Unremarkable HEART:  PMI not displaced or sustained,S1 and S2 within normal limits, no S3, no S4, no clicks, no rubs, no murmurs ABD:  Flat, positive bowel sounds normal in frequency in pitch, no bruits, no rebound, no guarding, no midline pulsatile mass, no hepatomegaly, no splenomegaly EXT:  2 plus pulses throughout, no edema, no cyanosis no clubbing  EKG:  EKG is not ordered today.   Recent Labs: No results found for requested labs within last 8760 hours.    Lipid Panel No results found for: CHOL, TRIG, HDL, CHOLHDL, VLDL, LDLCALC, LDLDIRECT    Wt Readings from Last 3 Encounters:  08/19/19 192 lb 6.4 oz (87.3 kg)  07/11/19 189 lb 9.6 oz (86 kg)  07/10/17 186 lb 12.8 oz (84.7 kg)      Other studies Reviewed: Additional studies/ records that were reviewed today include: None Review of the above records demonstrates:  Please see elsewhere in the note.     ASSESSMENT AND PLAN:  ChronicDiastolic HF:    I think the patient is likely euvolemic.  Probably some of her dyspnea is related to her obesity and sedentary lifestyle related to some joint problems.  She does wear oxygen at night.  Walking around the office today her oxygen saturation did fall from mid 90s to high 80s.  He has been taking Lasix every day for a week and then go back to the 3 times a week.  I do not want to check a BNP level.  I would like for her to have pulmonary function testing and also to follow-up with a pulmonologist who she has not seen for a few years.  Essential hypertension:   Her blood pressure is controlled.  She will continue the meds as listed.   Obesity: We have talked specifically about this. Current medicines are reviewed at length with the patient today.  The patient does not have concerns regarding medicines.  The following changes have been made:  See above  Labs/ tests ordered today include:    Orders Placed This Encounter  Procedures  . Brain natriuretic peptide  . Ambulatory referral to Pulmonology  . Pulmonary function test     Disposition:   FU with me in 2  months.     Signed, Minus Breeding, MD  08/19/2019 12:47 PM    [facility]

## 2019-08-19 ENCOUNTER — Other Ambulatory Visit: Payer: Self-pay

## 2019-08-19 ENCOUNTER — Ambulatory Visit (INDEPENDENT_AMBULATORY_CARE_PROVIDER_SITE_OTHER): Payer: Medicare Other | Admitting: Cardiology

## 2019-08-19 ENCOUNTER — Encounter: Payer: Self-pay | Admitting: Cardiology

## 2019-08-19 VITALS — BP 138/68 | HR 54 | Temp 97.4°F | Ht 58.25 in | Wt 192.4 lb

## 2019-08-19 DIAGNOSIS — I5031 Acute diastolic (congestive) heart failure: Secondary | ICD-10-CM | POA: Diagnosis not present

## 2019-08-19 DIAGNOSIS — R0602 Shortness of breath: Secondary | ICD-10-CM | POA: Diagnosis not present

## 2019-08-19 NOTE — Patient Instructions (Signed)
Medication Instructions:  Take Lasix every day for one week. Then back to every other day.  If you need a refill on your cardiac medications before your next appointment, please call your pharmacy.   Lab work: BNP If you have labs (blood work) drawn today and your tests are completely normal, you will receive your results only by: [place] (if you have MyChart) OR A paper copy in the mail If you have any lab test that is abnormal or we need to change your treatment, we will call you to review the results.  Testing/Procedures: Your physician has recommended that you have a pulmonary function test. Pulmonary Function Tests are a group of tests that measure how well air moves in and out of your lungs.   Follow-Up: At [facility] you and your health needs are our priority.  As part of our continuing mission to provide you with exceptional heart care, we have created designated Provider Care Teams.  These Care Teams include your primary Cardiologist (physician) and Advanced Practice Providers (APPs -  Physician Assistants and Nurse Practitioners) who all work together to provide you with the care you need, when you need it. You will need a follow up appointment in 2 months.  Please call our office 2 months in advance to schedule this appointment.  You may see Dr. Percival Spanish or one of the following Advanced Practice Providers on your designated Care Team:   Rosaria Ferries, PA-C Jory Sims, DNP, ANP

## 2019-08-20 ENCOUNTER — Telehealth: Payer: Self-pay

## 2019-08-20 LAB — BRAIN NATRIURETIC PEPTIDE: BNP: 31 pg/mL (ref 0.0–100.0)

## 2019-08-20 NOTE — Telephone Encounter (Signed)
-----   Message from Roland Earl sent at 08/19/2019 12:40 PM EDT ----- Regarding: PFT's COVID TESTING SCHEDULING TEMPLATE FOR [place]( [address]) IF PROCEDURE IS ON...                           THEN TEST ON... MONDAY THURSDAY (9-3:30) TUESDAY FRIDAY (9-3:30) WEDNESDAY SATURDAY (9-12:30) THURSDAY MONDAY (9-3:30) FRIDAY TUESDAY (9-3:30)                       #Wednesday testing is only 9-12#   There is a PFT scheduled on Name:  Tammie [name], a patient of Dr. Percival Spanish, on Monday 08/26/19.    Please call the patient and  schedule/order Covid testing by Thursday 08/22/19  at  [facility] and instruct the patient to self-isolate after the Covid testing is performed until the PFT appointment.  If the patient does not have the Covid testing performed and if we don't have the testing results back prior to the PFTappointment, we will have to cancel the test.

## 2019-08-20 NOTE — Telephone Encounter (Signed)
Covid test has been scheduled for 10/8 @ 1:20pm. Pt verbalized understanding.

## 2019-08-22 ENCOUNTER — Other Ambulatory Visit (HOSPITAL_COMMUNITY)
Admission: RE | Admit: 2019-08-22 | Discharge: 2019-08-22 | Disposition: A | Discharge status: Home / Self Care | Payer: Medicare Other | Source: Ambulatory Visit | Attending: Cardiology | Admitting: Cardiology

## 2019-08-22 DIAGNOSIS — Z01812 Encounter for preprocedural laboratory examination: Secondary | ICD-10-CM | POA: Insufficient documentation

## 2019-08-22 DIAGNOSIS — Z20828 Contact with and (suspected) exposure to other viral communicable diseases: Secondary | ICD-10-CM | POA: Insufficient documentation

## 2019-08-23 LAB — NOVEL CORONAVIRUS, NAA (HOSP ORDER, SEND-OUT TO REF LAB; TAT 18-24 HRS): SARS-CoV-2, NAA: NOT DETECTED

## 2019-08-26 ENCOUNTER — Ambulatory Visit (HOSPITAL_COMMUNITY)
Admission: RE | Admit: 2019-08-26 | Discharge: 2019-08-26 | Disposition: A | Discharge status: Home / Self Care | Payer: Medicare Other | Source: Ambulatory Visit | Attending: Cardiology | Admitting: Cardiology

## 2019-08-26 ENCOUNTER — Other Ambulatory Visit: Payer: Self-pay

## 2019-08-26 DIAGNOSIS — I5031 Acute diastolic (congestive) heart failure: Secondary | ICD-10-CM

## 2019-08-26 DIAGNOSIS — R0602 Shortness of breath: Secondary | ICD-10-CM | POA: Diagnosis not present

## 2019-08-26 LAB — PULMONARY FUNCTION TEST
DL/VA % pred: 115 %
DL/VA: 4.99 ml/min/mmHg/L
DLCO unc % pred: 77 %
DLCO unc: 11.36 ml/min/mmHg
FEF 25-75 Post: 1.51 L/sec
FEF 25-75 Pre: 1.84 L/sec
FEF2575-%Change-Post: -17 %
FEF2575-%Pred-Post: 146 %
FEF2575-%Pred-Pre: 178 %
FEV1-%Change-Post: -2 %
FEV1-%Pred-Post: 97 %
FEV1-%Pred-Pre: 99 %
FEV1-Post: 1.27 L
FEV1-Pre: 1.31 L
FEV1FVC-%Change-Post: -1 %
FEV1FVC-%Pred-Pre: 118 %
FEV6-%Change-Post: -1 %
FEV6-%Pred-Post: 87 %
FEV6-%Pred-Pre: 89 %
FEV6-Post: 1.47 L
FEV6-Pre: 1.5 L
FEV6FVC-%Change-Post: 0 %
FEV6FVC-%Pred-Post: 106 %
FEV6FVC-%Pred-Pre: 107 %
FVC-%Change-Post: -1 %
FVC-%Pred-Post: 82 %
FVC-%Pred-Pre: 83 %
FVC-Post: 1.48 L
FVC-Pre: 1.5 L
Post FEV1/FVC ratio: 86 %
Post FEV6/FVC ratio: 99 %
Pre FEV1/FVC ratio: 88 %
Pre FEV6/FVC Ratio: 100 %
RV % pred: 87 %
RV: 1.78 L
TLC % pred: 85 %
TLC: 3.43 L

## 2019-08-26 MED ORDER — ALBUTEROL SULFATE (2.5 MG/3ML) 0.083% IN NEBU
2.5000 mg | INHALATION_SOLUTION | Freq: Once | RESPIRATORY_TRACT | Status: AC
  Administered 2019-08-26: 2.5 mg via RESPIRATORY_TRACT

## 2019-08-26 NOTE — Telephone Encounter (Signed)
Patient seen by Dr Percival Spanish 10/5

## 2019-08-27 ENCOUNTER — Telehealth: Payer: Self-pay | Admitting: Cardiology

## 2019-08-27 NOTE — Telephone Encounter (Signed)
Returned call to patient no answer.LMTC. 

## 2019-08-27 NOTE — Telephone Encounter (Signed)
Patient states she was given a medication and it's making her sick, she doesn't know the name of it. She would like the nurse to give her a call.

## 2019-08-27 NOTE — Telephone Encounter (Signed)
She is on a very low dose of beta blocker.  She can stop this.

## 2019-08-27 NOTE — Telephone Encounter (Signed)
Pt informed of providers result & recommendations. Pt verbalized understanding. Pt will try and see how it it goes. No further questions .

## 2019-08-27 NOTE — Telephone Encounter (Signed)
Pt called in ans states that  She has been taking carvidelol and lasix since 07-11-2019 (she insists that she just started taking) she is taking lasix every other day and carvedilol BID. she states that she has only 1 kidney. Her BP has been running fine she states that it "runs about 143/92" does not have HR. She states that she has been having headache, lightheaded, diarrhea, dizziness and "bottom on fire" toe and LE numbness. She insists that all of these side effects are from one of these medications. She states that she  NEEDS AN ANSWER TODAY OR SHE WILL STOP THE MEDICATIONS TODAY, she cannot handle this any longer. Informed pt that she should not stop medications unless MD tells her to. She states that too many side effects are making her feel so bad. Please advise.

## 2019-08-28 ENCOUNTER — Telehealth: Payer: Self-pay

## 2019-08-28 DIAGNOSIS — R0602 Shortness of breath: Secondary | ICD-10-CM

## 2019-08-28 NOTE — Telephone Encounter (Signed)
Patient would like the results to her PFT.

## 2019-08-28 NOTE — Telephone Encounter (Signed)
Gave pt. results

## 2019-08-28 NOTE — Telephone Encounter (Signed)
Left message per DPR. Told pt about referral to pulmonology. Put in order for referral.

## 2019-08-28 NOTE — Telephone Encounter (Signed)
-----   Message from Minus Breeding, MD sent at 08/27/2019  9:41 PM EDT ----- She has some mild abnormality on PFT.  Don't know that it explains her dyspnea.  Please schedule her to see pulmonary as a new patient.

## 2019-09-12 ENCOUNTER — Other Ambulatory Visit: Payer: Self-pay

## 2019-09-12 ENCOUNTER — Ambulatory Visit (INDEPENDENT_AMBULATORY_CARE_PROVIDER_SITE_OTHER): Payer: Medicare Other | Admitting: Pulmonary Disease

## 2019-09-12 ENCOUNTER — Encounter: Payer: Self-pay | Admitting: Pulmonary Disease

## 2019-09-12 VITALS — BP 110/70 | HR 77 | Temp 97.0°F | Ht 59.0 in | Wt 193.4 lb

## 2019-09-12 DIAGNOSIS — R0981 Nasal congestion: Secondary | ICD-10-CM | POA: Diagnosis not present

## 2019-09-12 DIAGNOSIS — Z6839 Body mass index (BMI) 39.0-39.9, adult: Secondary | ICD-10-CM | POA: Diagnosis not present

## 2019-09-12 DIAGNOSIS — R0602 Shortness of breath: Secondary | ICD-10-CM

## 2019-09-12 NOTE — Patient Instructions (Addendum)
Thank you for visiting Dr. Valeta Harms at [facility]. Today we recommend the following:  Orders Placed This Encounter  Procedures  . Ambulatory referral to ENT   Return if symptoms worsen or fail to improve.    Please do your part to reduce the spread of COVID-19.

## 2019-09-12 NOTE — Progress Notes (Signed)
Synopsis: Referred in October 2020 for shortness of breath by Minus Breeding, MD  Subjective:   PATIENT ID: Tammie [name] GENDER: female DOB: 05/09/1941, MRN: [id number]  Chief Complaint  Patient presents with  . 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 She reports June 24th she was very sick and became very SOB and was later dx with Heart Failure. She is already being followed by Cardiology.     78 year old female past medical history of stage III CKD, depression, hypertension, obesity, obesity hypoventilation syndrome, history of renal cell cancer status post nephrectomy. Was diagnosed with heart failure in 24th June, admitted to the hospital and found to have volume on her. She was diuresis and was told that she diastolic heart failure.  At this point the patient complains of significant dyspnea on exertion.  But after further delineation it appears to only be episodic.  She states that she is at least able to ride her stationary bike at home for 20 minutes and sometimes feels fine.  But on occasion she feels short of breath when she climbs flights of stairs or she is pushing a grocery cart within the store.  But once she is able to sit down rest to catch her breath she feels fine again and that is able to restart.  She also has complaints of difficulty with reflux symptoms.  And a fullness within the chest.  She also complains of chronic nasal congestion.  She feels like sometimes this hinders her from breathing.  This has been going on for greater than 10 years she says.  She feels like she cannot blow her nose ever.  And she does not have a good sense of smell.   Past Medical History:  Diagnosis Date  . CKD (chronic kidney disease), stage III   . Colon adenoma   . Depression   . Glaucoma   . HTN (hypertension)   . Hypothyroidism   . Obesity   . Obesity hypoventilation syndrome ([place])   . Renal cell cancer ([place])    a. s/p right nephrectomy.     Family History  Problem  Relation Age of Onset  . Heart disease Father        due to small pox as a [place]  . Lung cancer Paternal Grandfather        lived to age 27     Past Surgical History:  Procedure Laterality Date  . JOINT REPLACEMENT    . KIDNEY SURGERY    . NEPHRECTOMY Right   . ORIF ANKLE FRACTURE Right 09/01/2014   Procedure: OPEN REDUCTION INTERNAL FIXATION (ORIF) ANKLE FRACTURE;  Surgeon: [name], MD;  Location: [facility];  Service: Orthopedics;  Laterality: Right;  . REPLACEMENT TOTAL KNEE    . TOTAL ABDOMINAL HYSTERECTOMY      Social History   Socioeconomic History  . Marital status: Married    Spouse name: Not on file  . Number of children: Not on file  . Years of education: Not on file  . Highest education level: Not on file  Occupational History  . Not on file  Social Needs  . Financial resource strain: Not on file  . Food insecurity    Worry: Not on file    Inability: Not on file  . Transportation needs    Medical: Not on file    Non-medical: Not on file  Tobacco Use  . Smoking status: Never Smoker  . Smokeless tobacco: Never Used  Substance and Sexual Activity  . Alcohol use: No  . Drug use: No  . Sexual activity: Not on file  Lifestyle  . Physical activity    Days per week: Not on file    Minutes per session: Not on file  . Stress: Not on file  Relationships  . Social Herbalist on phone: Not on file    Gets together: Not on file    Attends religious service: Not on file    Active member of club or organization: Not on file    Attends meetings of clubs or organizations: Not on file    Relationship status: Not on file  . Intimate partner violence    Fear of current or ex partner: Not on file    Emotionally abused: Not on file    Physically abused: Not on file    Forced sexual activity: Not on file  Other Topics Concern  . Not on file  Social History Narrative  . Not on file     Allergies  Allergen Reactions  . Levaquin [Levofloxacin]  Hives  . Nsaids Other (See Comments)    Avoid related to pt has had nephrectomy and advised to not use NSAIDs  . Hydrocodone-Acetaminophen Other (See Comments)    DRY MUCOUS MENBRANES  . Oxycodone Other (See Comments)    DRY MUCOUS MEMBRANES  . Quinolones Rash     Outpatient Medications Prior to Visit  Medication Sig Dispense Refill  . alprazolam (XANAX) 2 MG tablet Take 1 mg by mouth at bedtime.     Marland Kitchen amLODipine (NORVASC) 2.5 MG tablet Take 2.5 mg by mouth daily.     . Ascorbic Acid (VITAMIN C) 500 MG CAPS Take 500 mg by mouth daily.     . Calcium Carb-Cholecalciferol (CALCIUM + D3) 600-200 MG-UNIT TABS Take 1 tablet by mouth 2 (two) times daily.     . carvedilol (COREG) 3.125 MG tablet Take 1 tablet by mouth 2 (two) times daily.    . furosemide (LASIX) 20 MG tablet Take 20 mg by mouth 3 (three) times a week. Mondays, Wednesdays, Fridays    . Glucosamine-Chondroitin (GLUCOSAMINE CHONDR COMPLEX PO) Take 1 tablet by mouth 2 (two) times daily.     Marland Kitchen levothyroxine (SYNTHROID, LEVOTHROID) 75 MCG tablet Take 75 mcg by mouth daily before breakfast.    . losartan (COZAAR) 100 MG tablet Take 100 mg by mouth every morning.     . Multiple Vitamin (MULTI-VITAMINS) TABS Take 1 tablet by mouth daily.     Marland Kitchen omeprazole (PRILOSEC) 20 MG capsule Take by mouth daily.     . traZODone (DESYREL) 50 MG tablet Take 1 tablet by mouth at bedtime.     . vitamin B-12 (CYANOCOBALAMIN) 500 MCG tablet Take 500 mcg by mouth daily.     No facility-administered medications prior to visit.     Review of Systems  Constitutional: Negative for chills, fever, malaise/fatigue and weight loss.  HENT: Negative for hearing loss, sore throat and tinnitus.   Eyes: Negative for blurred vision and double vision.  Respiratory: Positive for shortness of breath. Negative for cough, hemoptysis, sputum production, wheezing and stridor.   Cardiovascular: Negative for chest pain, palpitations, orthopnea, leg swelling and PND.   Gastrointestinal: Negative for abdominal pain, constipation, diarrhea, heartburn, nausea and vomiting.  Genitourinary: Negative for dysuria, hematuria and urgency.  Musculoskeletal: Negative for joint pain and myalgias.  Skin: Negative for itching and rash.  Neurological: Negative for dizziness, tingling, weakness and  headaches.  Endo/Heme/Allergies: Negative for environmental allergies. Does not bruise/bleed easily.  Psychiatric/Behavioral: Negative for depression. The patient is not nervous/anxious and does not have insomnia.   All other systems reviewed and are negative.    Objective:  Physical Exam Vitals signs reviewed.  Constitutional:      General: She is not in acute distress.    Appearance: She is well-developed. She is obese.     Comments: Short stature  HENT:     Head: Normocephalic and atraumatic.  Eyes:     General: No scleral icterus.    Conjunctiva/sclera: Conjunctivae normal.     Pupils: Pupils are equal, round, and reactive to light.  Neck:     Musculoskeletal: Neck supple.     Vascular: No JVD.     Trachea: No tracheal deviation.  Cardiovascular:     Rate and Rhythm: Normal rate and regular rhythm.     Heart sounds: Normal heart sounds. No murmur.  Pulmonary:     Effort: Pulmonary effort is normal. No tachypnea, accessory muscle usage or respiratory distress.     Breath sounds: Normal breath sounds. No stridor. No wheezing, rhonchi or rales.  Abdominal:     General: Bowel sounds are normal. There is no distension.     Palpations: Abdomen is soft.     Tenderness: There is no abdominal tenderness.     Comments: Obese pannus  Musculoskeletal:        General: No tenderness.  Lymphadenopathy:     Cervical: No cervical adenopathy.  Skin:    General: Skin is warm and dry.     Capillary Refill: Capillary refill takes less than 2 seconds.     Findings: No rash.  Neurological:     Mental Status: She is alert and oriented to person, place, and time.   Psychiatric:        Behavior: Behavior normal.      Vitals:   09/12/19 1110  BP: 110/70  Pulse: 77  Temp: (!) 97 F (36.1 C)  TempSrc: Temporal  SpO2: 96%  Weight: 193 lb 6.4 oz (87.7 kg)  Height: 4\' 11"  (1.499 m)   96% on RA BMI Readings from Last 3 Encounters:  09/12/19 39.06 kg/m  08/19/19 39.87 kg/m  07/11/19 39.29 kg/m   Wt Readings from Last 3 Encounters:  09/12/19 193 lb 6.4 oz (87.7 kg)  08/19/19 192 lb 6.4 oz (87.3 kg)  07/11/19 189 lb 9.6 oz (86 kg)     CBC    Component Value Date/Time   WBC 8.6 08/31/2014 1038   RBC 4.72 08/31/2014 1038   HGB 14.4 08/31/2014 1038   HGB 14.5 12/30/2009 1331   HGB 14.4 07/28/2006 0939   HCT 44.9 08/31/2014 1038   HCT 44.1 12/30/2009 1331   HCT 43.1 07/28/2006 0939   PLT 266 08/31/2014 1038   PLT 312 12/30/2009 1331   PLT 319 07/28/2006 0939   MCV 95.1 08/31/2014 1038   MCV 91 12/30/2009 1331   MCV 92.3 07/28/2006 0939   MCH 30.5 08/31/2014 1038   MCHC 32.1 08/31/2014 1038   RDW 14.3 08/31/2014 1038   RDW 12.4 12/30/2009 1331   RDW 13.8 07/28/2006 0939   LYMPHSABS 2.2 08/31/2014 1038   LYMPHSABS 2.4 12/30/2009 1331   LYMPHSABS 2.5 07/28/2006 0939   MONOABS 0.6 08/31/2014 1038   MONOABS 0.5 07/28/2006 0939   EOSABS 0.1 08/31/2014 1038   EOSABS 0.3 12/30/2009 1331   BASOSABS 0.0 08/31/2014 1038   BASOSABS 0.0 12/30/2009 1331  BASOSABS 0.1 07/28/2006 0939    Chest Imaging: Chest x-ray 11/02/2018: Bronchial thickening, possible bronchitis The patient's images have been independently reviewed by me.    Pulmonary Functions Testing Results: PFT Results Latest Ref Rng & Units 08/26/2019 04/30/2014  FVC-Pre L 1.50 1.61  FVC-Predicted Pre % 83 80  FVC-Post L 1.48 1.63  FVC-Predicted Post % 82 81  Pre FEV1/FVC % % 88 87  Post FEV1/FCV % % 86 89  FEV1-Pre L 1.31 1.40  FEV1-Predicted Pre % 99 94  FEV1-Post L 1.27 1.45  DLCO UNC% % 77 108  DLCO COR %Predicted % 115 150  TLC L 3.43 2.98  TLC % Predicted  % 85 74  RV % Predicted % 87 58    FeNO: None   Pathology: None   Echocardiogram: None   Heart Catheterization: None     Assessment & Plan:     ICD-10-CM   1. SOB (shortness of breath)  R06.02   2. Nasal congestion  R09.81 Ambulatory referral to ENT  3. Class 2 severe obesity due to excess calories with serious comorbidity and body mass index (BMI) of 39.0 to 39.9 in adult [facility])  E66.01    Z68.39     Discussion:  I believe that the patient's shortness of breath is multifactorial.  She is a lifelong non-smoker no history of previous lung diseases.  Her medication list was reviewed for any potential diseases that may affect the lung.  Additionally she has had pulmonary function test which show a reduced FEV1 and FVC both of which are related to her BMI and her short stature.  Otherwise her lung function is normal.  She has also had a recent normal CAT scan of the chest which revealed a stable lung nodule from 2008.  The reports of this are available in care everywhere completed at [facility].  She also had recent cardiac work-up completed at [facility] located in care everywhere.  I think with all of these are reassuring studies that she has had completed to include cardiac evaluation, pulmonary function test and CT imaging.  I do not believe further testing is warranted at this time.  The patient has a BMI of 39 and is just under 5 feet tall.  She also does not have a very active lifestyle and then once she tries to do things she feels more short of breath.  Therefore I think that slowly trying to increase her exercise tolerance and weight loss are going to be very important to this.  Not to mention her shortness of breath is confounded by chronic diastolic heart failure as well as CKD.  When her volume retention is high her breathlessness is more.  As for her chronic nasal congestion we will place a referral to ENT per patient request.  Patient to return to clinic as needed.  Greater than 50% of  this patient's 45-minute visit was spent face-to-face discussing the above recommendations and treatment.   Current Outpatient Medications:  .  alprazolam (XANAX) 2 MG tablet, Take 1 mg by mouth at bedtime. , Disp: , Rfl:  .  amLODipine (NORVASC) 2.5 MG tablet, Take 2.5 mg by mouth daily. , Disp: , Rfl:  .  Ascorbic Acid (VITAMIN C) 500 MG CAPS, Take 500 mg by mouth daily. , Disp: , Rfl:  .  Calcium Carb-Cholecalciferol (CALCIUM + D3) 600-200 MG-UNIT TABS, Take 1 tablet by mouth 2 (two) times daily. , Disp: , Rfl:  .  carvedilol (COREG) 3.125 MG tablet, Take  1 tablet by mouth 2 (two) times daily., Disp: , Rfl:  .  furosemide (LASIX) 20 MG tablet, Take 20 mg by mouth 3 (three) times a week. Mondays, Wednesdays, Fridays, Disp: , Rfl:  .  Glucosamine-Chondroitin (GLUCOSAMINE CHONDR COMPLEX PO), Take 1 tablet by mouth 2 (two) times daily. , Disp: , Rfl:  .  levothyroxine (SYNTHROID, LEVOTHROID) 75 MCG tablet, Take 75 mcg by mouth daily before breakfast., Disp: , Rfl:  .  losartan (COZAAR) 100 MG tablet, Take 100 mg by mouth every morning. , Disp: , Rfl:  .  Multiple Vitamin (MULTI-VITAMINS) TABS, Take 1 tablet by mouth daily. , Disp: , Rfl:  .  omeprazole (PRILOSEC) 20 MG capsule, Take by mouth daily. , Disp: , Rfl:  .  traZODone (DESYREL) 50 MG tablet, Take 1 tablet by mouth at bedtime. , Disp: , Rfl:  .  vitamin B-12 (CYANOCOBALAMIN) 500 MCG tablet, Take 500 mcg by mouth daily., Disp: , Rfl:    Garner Nash, DO [facility] 09/12/2019 11:16 AM

## 2019-10-14 ENCOUNTER — Telehealth: Payer: Self-pay | Admitting: Pulmonary Disease

## 2019-10-14 ENCOUNTER — Ambulatory Visit (INDEPENDENT_AMBULATORY_CARE_PROVIDER_SITE_OTHER): Payer: Medicare Other | Admitting: Adult Health

## 2019-10-14 ENCOUNTER — Telehealth: Payer: Self-pay | Admitting: Cardiology

## 2019-10-14 ENCOUNTER — Encounter: Payer: Self-pay | Admitting: Adult Health

## 2019-10-14 DIAGNOSIS — R0602 Shortness of breath: Secondary | ICD-10-CM | POA: Diagnosis not present

## 2019-10-14 NOTE — Telephone Encounter (Signed)
Pt c/o Shortness Of Breath: STAT if SOB developed within the last 24 hours or pt is noticeably SOB on the phone  1. Are you currently SOB (can you hear that pt is SOB on the phone)? No   2. How long have you been experiencing SOB? For the past 3-4 days   3. Are you SOB when sitting or when up moving around? When she is moving around, she states it drops to 88%. She does use oxygen at night. When she is wearing her oxygen and sitting it's 93%  4. Are you currently experiencing any other symptoms? She is having some mild swelling. He BP is 143/78.   She currently has appt with Dr. Percival Spanish on 12/18, I offered her appt on 12/3, she refused that one. She wants to be seen tomorrow.

## 2019-10-14 NOTE — Telephone Encounter (Signed)
Spoke with the pt  She is c/o chills, increased SOB and states that her sats have been 88-90% ra  Televisit with TP for today at 4 pm

## 2019-10-14 NOTE — Progress Notes (Signed)
Virtual Visit via Telephone Note  I connected with Tammie [name] on 10/14/19 at  4:00 PM EST by telephone and verified that I am speaking with the correct person using two identifiers.  Location: Patient: Home  Provider: Office    I discussed the limitations, risks, security and privacy concerns of performing an evaluation and management service by telephone and the availability of in person appointments. I also discussed with the patient that there may be a patient responsible charge related to this service. The patient expressed understanding and agreed to proceed.   History of Present Illness: 78 year old female never smoker seen for pulmonary consult September 12, 2019 for ongoing shortness of breath.  Medical history significant for congestive heart failure, stage III chronic kidney disease, morbid obesity, obesity hypoventilation syndrome, renal cell cancer status post nephrectomy.   Today's televisit is an acute office visit for shortness of breath.  Patient complains over the last week that she continues to have shortness of breath especially with activity.  She is also has some lower extremity ankle edema.  She denies any cough, congestion, fever, chest pain, calf pain.  Patient has known diastolic heart failure.  She is followed by cardiology.  She is on Lasix 20 mg Monday Wednesday Friday.  She is on nocturnal oxygen at 2.5 L.  Has noticed that her O2 saturations at home have been 88 to 90% walking.  This exertional hypoxemia has been going on for a while.  But she says that it has been more noticeable and she is getting more short of breath with activity she is sedentary but feels her activity tolerance is declining.  she has been using her oxygen with activity which seems to help.  Previous pulmonary function testing was normal with no significant airflow restriction or obstruction.  DLCO was 77%.  Patient is a never smoker. Last month patient was recommended to see ENT for chronic  nasal congestion.  She has an appointment tomorrow.. She was admitted earlier this year for decompensated diastolic failure.  She improved with diuresis. Daughter is present for visit.   Observations/Objective: 10/14/19 Speaks in full sentences.  PFT August 26, 2019 showed FEV1 97%, ratio 86, FVC 82%, no significant bronchodilator response, DLCO 77%.  Care everywhere notes CT chest June 2020 moderate hiatal hernia, scattered small platelike peripheral opacities suggestive of atelectasis.  Stable right lower lobe pulmonary nodule 8.6 mm previously noted in 2008.  Stress Myoview June 2020 [facility] EF 86% normal stress and rest myocardial perfusion images  BNP 08/2019 -nml   Assessment and Plan: Progressive dyspnea suspect is related to decompensated diastolic heart failure-patient sounds stable on the phone has oxygen with O2 saturations greater than 90%.  This seems to be a chronic progressive issue.  Although advised her if symptoms worsen O2 saturations decreased and or develops chest pain, nausea vomiting fever etc. she is to seek emergency care immediately.  She will return to the office for close follow-up tomorrow with labs and chest x-ray.  We will use gentle diuresis as patient has chronic kidney disease and previous nephrectomy. Patient does have an upcoming appointment with cardiology in 2 weeks.  Her daughter also says that she is going to contact them to see if she can get a sooner appointment.  Plan  Patient Instructions  Take extra lasix 20mg  daily today and tomorrow .  Return to office tomorrow for follow up with labs and chest xray .  Wear oxygen 2.5l/m continously until seen in office  tomorrow  Keep follow up with Cardiology in 2 weeks as planned . Can get sooner appointment if able.  If you get worse overnight go to ER .  Please contact office for sooner follow up if symptoms do not improve or worsen or seek emergency care       Follow Up  Instructions: Follow-up in 1 day with labs and chest x-ray   I discussed the assessment and treatment plan with the patient. The patient was provided an opportunity to ask questions and all were answered. The patient agreed with the plan and demonstrated an understanding of the instructions.   The patient was advised to call back or seek an in-person evaluation if the symptoms worsen or if the condition fails to improve as anticipated.  I provided 33 minutes of non-face-to-face time during this encounter.   Rexene Edison, NP

## 2019-10-14 NOTE — Patient Instructions (Addendum)
Take extra lasix 20mg  daily today and tomorrow .  Return to office tomorrow for follow up with labs and chest xray .  Wear oxygen 2.5l/m continously until seen in office tomorrow  Keep follow up with Cardiology in 2 weeks as planned . Can get sooner appointment if able.  If you get worse overnight go to ER .  Please contact office for sooner follow up if symptoms do not improve or worsen or seek emergency care

## 2019-10-14 NOTE — Telephone Encounter (Signed)
Left message to call back  

## 2019-10-15 ENCOUNTER — Ambulatory Visit (INDEPENDENT_AMBULATORY_CARE_PROVIDER_SITE_OTHER): Payer: Medicare Other | Admitting: Adult Health

## 2019-10-15 ENCOUNTER — Ambulatory Visit (INDEPENDENT_AMBULATORY_CARE_PROVIDER_SITE_OTHER): Payer: Medicare Other

## 2019-10-15 ENCOUNTER — Other Ambulatory Visit: Payer: Self-pay

## 2019-10-15 ENCOUNTER — Encounter: Payer: Self-pay | Admitting: Adult Health

## 2019-10-15 ENCOUNTER — Telehealth: Payer: Self-pay | Admitting: Cardiology

## 2019-10-15 ENCOUNTER — Encounter: Payer: Self-pay | Admitting: Physician Assistant

## 2019-10-15 ENCOUNTER — Ambulatory Visit (INDEPENDENT_AMBULATORY_CARE_PROVIDER_SITE_OTHER): Payer: Medicare Other | Admitting: Physician Assistant

## 2019-10-15 VITALS — BP 145/67 | HR 75 | Ht 58.5 in | Wt 190.0 lb

## 2019-10-15 DIAGNOSIS — N182 Chronic kidney disease, stage 2 (mild): Secondary | ICD-10-CM | POA: Diagnosis not present

## 2019-10-15 DIAGNOSIS — Z79899 Other long term (current) drug therapy: Secondary | ICD-10-CM

## 2019-10-15 DIAGNOSIS — N183 Chronic kidney disease, stage 3 unspecified: Secondary | ICD-10-CM

## 2019-10-15 DIAGNOSIS — I5031 Acute diastolic (congestive) heart failure: Secondary | ICD-10-CM

## 2019-10-15 DIAGNOSIS — G4734 Idiopathic sleep related nonobstructive alveolar hypoventilation: Secondary | ICD-10-CM | POA: Diagnosis not present

## 2019-10-15 DIAGNOSIS — M7989 Other specified soft tissue disorders: Secondary | ICD-10-CM | POA: Diagnosis not present

## 2019-10-15 DIAGNOSIS — R0609 Other forms of dyspnea: Secondary | ICD-10-CM

## 2019-10-15 DIAGNOSIS — J343 Hypertrophy of nasal turbinates: Secondary | ICD-10-CM | POA: Diagnosis not present

## 2019-10-15 DIAGNOSIS — R0602 Shortness of breath: Secondary | ICD-10-CM | POA: Diagnosis not present

## 2019-10-15 DIAGNOSIS — R0982 Postnasal drip: Secondary | ICD-10-CM | POA: Diagnosis not present

## 2019-10-15 DIAGNOSIS — J310 Chronic rhinitis: Secondary | ICD-10-CM | POA: Diagnosis not present

## 2019-10-15 DIAGNOSIS — Z905 Acquired absence of kidney: Secondary | ICD-10-CM

## 2019-10-15 DIAGNOSIS — R0600 Dyspnea, unspecified: Secondary | ICD-10-CM | POA: Insufficient documentation

## 2019-10-15 NOTE — Telephone Encounter (Signed)
D/w Doreene Adas and pt at virtual visit today she has Pulmonary appt today at 230pm

## 2019-10-15 NOTE — Telephone Encounter (Signed)
See previous 12/1 telephone note. 

## 2019-10-15 NOTE — Telephone Encounter (Signed)
Pt c/o Shortness Of Breath: STAT if SOB developed within the last 24 hours or pt is noticeably SOB on the phone  1. Are you currently SOB (can you hear that pt is SOB on the phone)? yes  2. How long have you been experiencing SOB? A few days  3. Are you SOB when sitting or when up moving around? both  4. Are you currently experiencing any other symptoms? No

## 2019-10-15 NOTE — Progress Notes (Signed)
Virtual Visit via Telephone Note   This visit type was conducted due to national recommendations for restrictions regarding the COVID-19 Pandemic (e.g. social distancing) in an effort to limit this patient's exposure and mitigate transmission in our community.  Due to her co-morbid illnesses, this patient is at least at moderate risk for complications without adequate follow up.  This format is felt to be most appropriate for this patient at this time.  The patient did not have access to video technology/had technical difficulties with video requiring transitioning to audio format only (telephone).  All issues noted in this document were discussed and addressed.  No physical exam could be performed with this format.  Please refer to the patient's chart for her  consent to telehealth for [facility]   Date:  10/15/2019   ID:  Tammie [name], DOB Oct 26, 1941, MRN [id number]  Patient Location: Home Provider Location: Home  PCP:  Carol Ada, MD  Cardiologist:  Minus Breeding, MD  Electrophysiologist:  None   Evaluation Performed:  Follow-Up Visit  Chief Complaint:  SOB  History of Present Illness:    Tammie [name] is a 78 y.o. female with a history of chronic diastolic heart failure.  She was hospitalized in June 2020 in [place] with acute on chronic diastolic heart failure.  Her cardiogram at that time with normal EF and no valvular disease.  Nuclear stress test was negative.  She was managed with diuretics.  She only has one kidney with stable renal function.  Her beta-blocker was stopped in October by Dr. Percival Spanish for memory issues.  Patient has ongoing dyspnea likely related to obesity and sedentary lifestyle.  She also has some joint problems.  She wears oxygen at night only.  At baseline, she generally takes Lasix 3 times per week.  She was last seen by Dr. Percival Spanish on 08/19/2019.  She was noted to be euvolemic at that time.  However, her oxygen saturations did drop from the  mid 90s to the high 80s with ambulation in the office.  BN P at that time was normal.  Patient called our office today with complaints of increased swelling in her lower legs, increased shortness of breath including dyspnea on exertion for the past 4 to 5 days.  She called her pulmonologist who advised her to increase her Lasix yesterday and to follow-up with cardiology.  She was added to my schedule.  I was able to connect with her via telephone call. She complains of O2 sats in at 88%. She has been wearing her O2 during the day. She has cough. She states she got sick yesterday including SOB, hypoxia, DOE, and she is now hypertensive. She also has swelling in her feet. She has taken 20 mg lasix x 2 yesterday and she has taken 20 mg x 1 today. She reports voiding more on the extra lasix. Swelling is slightly better today. However, this has not translated into improvement in her breathing. She also reports chills, no fever. She denies cough, GI symptoms, and loss of taste and smell. She may need to be admitted to manage her diuresis and renal function - she does not want to go to the hospital.  The patient does have symptoms concerning for COVID-19 infection (fever, chills, cough, or new shortness of breath).    Past Medical History:  Diagnosis Date  . CKD (chronic kidney disease), stage III   . Colon adenoma   . Depression   . Glaucoma   . HTN (  hypertension)   . Hypothyroidism   . Obesity   . Obesity hypoventilation syndrome ([place])   . Renal cell cancer ([place])    a. s/p right nephrectomy.   Past Surgical History:  Procedure Laterality Date  . JOINT REPLACEMENT    . KIDNEY SURGERY    . NEPHRECTOMY Right   . ORIF ANKLE FRACTURE Right 09/01/2014   Procedure: OPEN REDUCTION INTERNAL FIXATION (ORIF) ANKLE FRACTURE;  Surgeon: [name], MD;  Location: [facility];  Service: Orthopedics;  Laterality: Right;  . REPLACEMENT TOTAL KNEE    . TOTAL ABDOMINAL HYSTERECTOMY       Current Meds   Medication Sig  . alprazolam (XANAX) 2 MG tablet Take 2 mg by mouth at bedtime.   Marland Kitchen amLODipine (NORVASC) 2.5 MG tablet Take 2.5 mg by mouth daily.   . Ascorbic Acid (VITAMIN C) 500 MG CAPS Take 500 mg by mouth daily.   . Calcium Carb-Cholecalciferol (CALCIUM + D3) 600-200 MG-UNIT TABS Take 1 tablet by mouth 2 (two) times daily.   . carvedilol (COREG) 3.125 MG tablet Take 1 tablet by mouth 2 (two) times daily.  . fluticasone (FLONASE) 50 MCG/ACT nasal spray Place 1 spray into both nostrils 2 (two) times daily as needed for allergies or rhinitis.  . furosemide (LASIX) 20 MG tablet Take 20 mg by mouth 3 (three) times a week. Mondays, Wednesdays, Fridays  . Glucosamine-Chondroitin (GLUCOSAMINE CHONDR COMPLEX PO) Take 1 tablet by mouth 2 (two) times daily.   Marland Kitchen levothyroxine (SYNTHROID, LEVOTHROID) 75 MCG tablet Take 75 mcg by mouth daily before breakfast.  . losartan (COZAAR) 100 MG tablet Take 100 mg by mouth every morning.   . Multiple Vitamin (MULTI-VITAMINS) TABS Take 1 tablet by mouth daily.   Marland Kitchen omeprazole (PRILOSEC) 20 MG capsule Take 20 mg by mouth daily.   . traZODone (DESYREL) 50 MG tablet Take 1 tablet by mouth at bedtime.   . vitamin B-12 (CYANOCOBALAMIN) 500 MCG tablet Take 500 mcg by mouth daily.     Allergies:   Levaquin [levofloxacin], Nsaids, Hydrocodone-acetaminophen, Oxycodone, and Quinolones   Social History   Tobacco Use  . Smoking status: Never Smoker  . Smokeless tobacco: Never Used  Substance Use Topics  . Alcohol use: No  . Drug use: No     Family Hx: The patient's family history includes Heart disease in her father; Lung cancer in her paternal grandfather.  ROS:   Please see the history of present illness.     All other systems reviewed and are negative.   Prior CV studies:   The following studies were reviewed today:  Echo 03/08/17: Study Conclusions  - Left ventricle: The cavity size was normal. There was mild focal   basal hypertrophy of the  septum. Systolic function was normal.   The estimated ejection fraction was in the range of 60% to 65%.   Wall motion was normal; there were no regional wall motion   abnormalities. Doppler parameters are consistent with abnormal   left ventricular relaxation (grade 1 diastolic dysfunction). - Right atrium: The atrium was mildly dilated.  Impressions:  - Compared to the prior study, there has been no significant   interval change.  Labs/Other Tests and Data Reviewed:    EKG:  No ECG reviewed.  Recent Labs: 08/19/2019: BNP 31.0   Recent Lipid Panel No results found for: CHOL, TRIG, HDL, CHOLHDL, LDLCALC, LDLDIRECT  Wt Readings from Last 3 Encounters:  10/15/19 190 lb (86.2 kg)  09/12/19 193 lb 6.4  oz (87.7 kg)  08/19/19 192 lb 6.4 oz (87.3 kg)     Objective:    Vital Signs:  BP (!) 145/67   Pulse 75   Ht 4' 10.5" (1.486 m)   Wt 190 lb (86.2 kg)   SpO2 97% Comment: w/O2  BMI 39.03 kg/m    VITAL SIGNS:  reviewed GEN:  no acute distress RESPIRATORY:  respirations unlabored, phone call difficutl to determine SOB NEURO:  alert and oriented x 3, no obvious focal deficit PSYCH:  normal affect  ASSESSMENT & PLAN:    Shortness of breath Dyspnea on exertion Lower extremity swelling Acute on chronic diastolic heart failure - some improvement with extra lasix - she generally takes 20 mg MWF - In instructed 20 mg T, 40 mg Wed, 20 mg Thur and come for BMP and BNP,then 20 mg Fri until she hears from me - I reiterated that she may be best served in the hospital setting, but she is not interested in this - I have also advised a COVID test - she will have a CXR with pulmonary - she thinks she has bronchitis or PNA   Solitary kidney status post right nephrectomy for renal cell carcinoma CKD stage III - I have cautiously increased lasix in an attempt to keep her out of the hospital   Hypertension - continue present medications with the above lasix changes   Obesity  Obesity hypoventilation syndrome - complicates respiratory status   COVID-19 Education: The signs and symptoms of COVID-19 were discussed with the patient and how to seek care for testing (follow up with PCP or arrange E-visit).  The importance of social distancing was discussed today.  Time:   Today, I have spent 26 minutes with the patient with telehealth technology discussing the above problems.     Medication Adjustments/Labs and Tests Ordered: Current medicines are reviewed at length with the patient today.  Concerns regarding medicines are outlined above.   Tests Ordered: No orders of the defined types were placed in this encounter.   Medication Changes: No orders of the defined types were placed in this encounter.   Follow Up:  Virtual Visit  in 1 week(s)  Signed, Ledora Bottcher, PA  10/15/2019 10:59 AM    [place]

## 2019-10-15 NOTE — Patient Instructions (Addendum)
Take Lasix as directed this week per Cardiology , get labs this week as planned at Cardiology . Begin Oxygen 2l/m with activity  Continue on Oxygen 2.5l/m At bedtime   Order for smaller portable concentrator to replace your current one.  Follow up with Dr. Estanislado Spire or [place] NP in 4-6 weeks and As needed   Please contact office for sooner follow up if symptoms do not improve or worsen or seek emergency care

## 2019-10-15 NOTE — Progress Notes (Signed)
@Patient  ID: Tammie [name], female    DOB: 09/13/1941, 78 y.o.   MRN: [id number]  Chief Complaint  Patient presents with   Follow-up    Dyspnea     Referring provider: Carol Ada, MD  HPI: 78 year old female never smoker seen for pulmonary consult September 12, 2019 for ongoing shortness of breath.  Medical history significant for congestive heart failure, stage III chronic kidney disease, morbid obesity, obesity hypoventilation syndrome and renal cell carcinoma status post nephrectomy, Moderate to severe scoliosis -chronic back pain  From [place], speaks excellent english   TEST/EVENTS :  PFT August 26, 2019 showed FEV1 97%, ratio 86, FVC 82%, no significant bronchodilator response, DLCO 77%.  Care everywhere notes CT chest June 2020 moderate hiatal hernia, scattered small platelike peripheral opacities suggestive of atelectasis.  Stable right lower lobe pulmonary nodule 8.6 mm previously noted in 2008.  Stress Myoview June 2020 [facility] EF 86% normal stress and rest myocardial perfusion images  BNP 08/2019 -nml   10/15/2019 Follow up : Dyspnea Patient presents for ongoing progressive shortness of breath especially with activity and intermittent desaturations with activity.  Patient has known diastolic heart failure.  Patient was seen via telemedicine visit yesterday with worsening shortness of breath over the last couple weeks.  She had some increased ankle edema.  And had noticed O2 saturations at 88 to 90% walking.  She is on nocturnal oxygen at 2 L.  She is a never smoker.  She was treated earlier this year for decompensated heart failure.  Patient was recommended to take Lasix 20 mg yesterday and today.  She had a  chest x-ray done today that show bronchitic changes, no acute process.  She says she is feeling okay, some better. Gets winded with minimal activity and activity tolerance is decreasing over last year. Walk test in office today with O2 sats 85% walking  lap around in office, quick recovery with rest to >90% on room air.  Feels that oxygen during daytime would help . Wants a more portable system. She is on Oxygen 2.5l/m At bedtime  .  Has scoliosis with back pain , hard to bend over gets winded. Weight trending up , can not lose weight , feels because she is not active.  Seen [facility] today , Lasix adjusted to 20mg  daily for rest of week (40mg  daily tomorrow) , labs at end of week.  Seen ENT today . Told she has deviated septum but not surgical candidate. Causes her constant nasal stuffiness.   She denies any cough, congestion, fever, loss of taste or smell.  She denies any chest pain, hemoptysis, calf pain  Allergies  Allergen Reactions   Levaquin [Levofloxacin] Hives   Nsaids Other (See Comments)    Avoid related to pt has had nephrectomy and advised to not use NSAIDs   Hydrocodone-Acetaminophen Other (See Comments)    DRY MUCOUS MENBRANES   Oxycodone Other (See Comments)    DRY MUCOUS MEMBRANES   Quinolones Rash    Immunization History  Administered Date(s) Administered   Fluad Quad(high Dose 65+) 07/22/2019   Influenza Split 09/09/2013, 08/14/2015   Influenza-Unspecified 08/14/2014, 08/25/2017   Pneumococcal Polysaccharide-23 09/09/2013    Past Medical History:  Diagnosis Date   CKD (chronic kidney disease), stage III    Colon adenoma    Depression    Glaucoma    HTN (hypertension)    Hypothyroidism    Obesity    Obesity hypoventilation syndrome ([facility])    Renal cell  cancer [facility])    a. s/p right nephrectomy.    Tobacco History: Social History   Tobacco Use  Smoking Status Never Smoker  Smokeless Tobacco Never Used   Counseling given: Not Answered   Outpatient Medications Prior to Visit  Medication Sig Dispense Refill   alprazolam (XANAX) 2 MG tablet Take 2 mg by mouth at bedtime.      amLODipine (NORVASC) 2.5 MG tablet Take 2.5 mg by mouth daily.      Ascorbic Acid (VITAMIN C) 500 MG  CAPS Take 500 mg by mouth daily.      Calcium Carb-Cholecalciferol (CALCIUM + D3) 600-200 MG-UNIT TABS Take 1 tablet by mouth 2 (two) times daily.      carvedilol (COREG) 3.125 MG tablet Take 1 tablet by mouth 2 (two) times daily.     fluticasone (FLONASE) 50 MCG/ACT nasal spray Place 1 spray into both nostrils 2 (two) times daily as needed for allergies or rhinitis.     furosemide (LASIX) 20 MG tablet Take 20 mg by mouth 3 (three) times a week. Mondays, Wednesdays, Fridays     Glucosamine-Chondroitin (GLUCOSAMINE CHONDR COMPLEX PO) Take 1 tablet by mouth 2 (two) times daily.      levothyroxine (SYNTHROID, LEVOTHROID) 75 MCG tablet Take 75 mcg by mouth daily before breakfast.     losartan (COZAAR) 100 MG tablet Take 100 mg by mouth every morning.      Multiple Vitamin (MULTI-VITAMINS) TABS Take 1 tablet by mouth daily.      omeprazole (PRILOSEC) 20 MG capsule Take 20 mg by mouth daily.      traZODone (DESYREL) 50 MG tablet Take 1 tablet by mouth at bedtime.      vitamin B-12 (CYANOCOBALAMIN) 500 MCG tablet Take 500 mcg by mouth daily.     No facility-administered medications prior to visit.      Review of Systems:   Constitutional:   No  weight loss, night sweats,  Fevers, chills, + fatigue, or  lassitude.  HEENT:   No headaches,  Difficulty swallowing,  Tooth/dental problems, or  Sore throat,                No sneezing, itching, ear ache,  +nasal congestion, post nasal drip,   CV:  No chest pain,  Orthopnea, PND, +swelling in lower extremities, anasarca, dizziness, palpitations, syncope.   GI  No heartburn, indigestion, abdominal pain, nausea, vomiting, diarrhea, change in bowel habits, loss of appetite, bloody stools.   Resp:   No coughing up of blood.  No change in color of mucus.  No wheezing.  No chest wall deformity  Skin: no rash or lesions.  GU: no dysuria, change in color of urine, no urgency or frequency.  No flank pain, no hematuria   MS:  No joint pain or  swelling.  No decreased range of motion.  No back pain.    Physical Exam  BP 136/72 (BP Location: Left Arm, Cuff Size: Large)    Pulse 73    Temp 98.1 F (36.7 C) (Temporal)    Ht 4' 10.5" (1.486 m)    Wt 191 lb (86.6 kg)    SpO2 90% Comment: RA   BMI 39.24 kg/m   GEN: A/Ox3; pleasant , NAD, elderly obese per BMI  HEENT:  [place]/AT, , NOSE-clear, THROAT-clear, no lesions, no postnasal drip or exudate noted.   NECK:  Supple w/ fair ROM; no JVD; normal carotid impulses w/o bruits; no thyromegaly or nodules palpated; no lymphadenopathy.  RESP  Clear  P & A; w/o, wheezes/ rales/ or rhonchi. no accessory muscle use, no dullness to percussion  CARD:  RRR, no m/r/g, tr  peripheral edema, pulses intact, no cyanosis or clubbing.  GI:   Soft & nt; nml bowel sounds; no organomegaly or masses detected.   Musco: Warm bil, no deformities or joint swelling noted.   Neuro: alert, no focal deficits noted.    Skin: Warm, no lesions or rashes    Lab Results:  CBC  BNP    Component Value Date/Time   BNP 31.0 08/19/2019 1231    ProBNP No results found for: PROBNP  Imaging: Dg Chest 2 View  Result Date: 10/15/2019 CLINICAL DATA:  78 year old female with shortness of breath. EXAM: CHEST - 2 VIEW COMPARISON:  Chest radiograph dated 10/31/2018. FINDINGS: There is mild chronic bronchitic changes. No focal consolidation, pleural effusion, or pneumothorax. The cardiac silhouette is within normal limits. Atherosclerotic calcification of the aorta. No acute osseous pathology. IMPRESSION: No active cardiopulmonary disease. Electronically Signed   By: Anner Crete M.D.   On: 10/15/2019 15:50      PFT Results Latest Ref Rng & Units 08/26/2019 04/30/2014  FVC-Pre L 1.50 1.61  FVC-Predicted Pre % 83 80  FVC-Post L 1.48 1.63  FVC-Predicted Post % 82 81  Pre FEV1/FVC % % 88 87  Post FEV1/FCV % % 86 89  FEV1-Pre L 1.31 1.40  FEV1-Predicted Pre % 99 94  FEV1-Post L 1.27 1.45  DLCO UNC% % 77  108  DLCO COR %Predicted % 115 150  TLC L 3.43 2.98  TLC % Predicted % 85 74  RV % Predicted % 87 58    No results found for: NITRICOXIDE      Assessment & Plan:   Dyspnea Progressive dyspnea suspect is multifactorial with underlying chronic diastolic heart failure, deconditioning, morbid obesity.  Patient does have scoliosis suspect she has some restrictive process in her lungs however PFTs did not show any significant restriction or obstruction.  She does have some mild exertional hypoxemia.  We will start her on oxygen with activity.  Diurese as able.  Have to monitor her kidney function closely She would benefit from physical therapy or cardiopulmonary rehab to help with her strength. Chest x-ray today with no acute process.  Plan  Patient Instructions  Take Lasix as directed this week per Cardiology , get labs this week as planned at Cardiology . Begin Oxygen 2l/m with activity  Continue on Oxygen 2.5l/m At bedtime   Order for smaller portable concentrator to replace your current one.  Follow up with Dr. Estanislado Spire or Aubria Vanecek NP in 4-6 weeks and As needed   Please contact office for sooner follow up if symptoms do not improve or worsen or seek emergency care        CKD (chronic kidney disease) stage 2, GFR 60-89 ml/min Gentle diuresis.  Labs later this week as planned  Nocturnal hypoxemia No significant sleep apnea.  Has some mild restriction only on PFTs.  Suspect she has some hypoventilation issues (OHS).  She has had exertional hypoxemia for a while this seems to be contributing to a lot of her symptoms.  She does qualify for exertional oxygen.  Will begin 2 L with activity.     Rexene Edison, NP 10/15/2019

## 2019-10-15 NOTE — Telephone Encounter (Signed)
Received a call from patient she stated she has been having sob when she walks from room to room since yesterday.Increase swelling in both lower legs.She has not weighed.Stated she called her pulmonary Dr. was told to take a extra Lasix yesterday and today and see Dr.Hochrein.Advised Dr.Hochrein's schedule is full this afternoon.Virtual appointment scheduled with Fabian Sharp PA this morning at 10:30 am.

## 2019-10-15 NOTE — Patient Instructions (Signed)
Labwork: BMET AND BNP ON THURSDAY HERE IN OUR OFFICE AT [facility]    You will NOT need to fast   If you have labs (blood work) drawn today and your tests are completely normal, you will receive your results only by: Marland Kitchen MyChart Message (if you have MyChart) OR . A paper copy in the mail If you have any lab test that is abnormal or we need to change your treatment, we will call you to review the results.  Special Instructions: HAVE CHEST XRAY TODAY IN PULMONARY APPT AND DISCUSS COVID TESTING THERE ALSO.  Follow-Up: AFTER PULMONARY APPT   At [facility], you and your health needs are our priority.  As part of our continuing mission to provide you with exceptional heart care, we have created designated Provider Care Teams.  These Care Teams include your primary Cardiologist (physician) and Advanced Practice Providers (APPs -  Physician Assistants and Nurse Practitioners) who all work together to provide you with the care you need, when you need it.  Thank you for choosing [facility] at [facility]!!             Happy Holidays!!

## 2019-10-15 NOTE — Assessment & Plan Note (Addendum)
Progressive dyspnea suspect is multifactorial with underlying chronic diastolic heart failure, deconditioning, morbid obesity.  Patient does have scoliosis suspect she has some restrictive process in her lungs however PFTs did not show any significant restriction or obstruction.  She does have some mild exertional hypoxemia.  We will start her on oxygen with activity.  Diurese as able.  Have to monitor her kidney function closely She would benefit from physical therapy or cardiopulmonary rehab to help with her strength. Chest x-ray today with no acute process.  Plan  Patient Instructions  Take Lasix as directed this week per Cardiology , get labs this week as planned at Cardiology . Begin Oxygen 2l/m with activity  Continue on Oxygen 2.5l/m At bedtime   Order for smaller portable concentrator to replace your current one.  Follow up with Dr. Estanislado Spire or Jennine Peddy NP in 4-6 weeks and As needed   Please contact office for sooner follow up if symptoms do not improve or worsen or seek emergency care

## 2019-10-15 NOTE — Assessment & Plan Note (Signed)
No significant sleep apnea.  Has some mild restriction only on PFTs.  Suspect she has some hypoventilation issues (OHS).  She has had exertional hypoxemia for a while this seems to be contributing to a lot of her symptoms.  She does qualify for exertional oxygen.  Will begin 2 L with activity.

## 2019-10-15 NOTE — Assessment & Plan Note (Signed)
Gentle diuresis.  Labs later this week as planned

## 2019-10-16 NOTE — Addendum Note (Signed)
Addended by: Parke Poisson E on: 10/16/2019 04:44 PM   Modules accepted: Orders

## 2019-10-16 NOTE — Progress Notes (Signed)
PCCM: thanks for seeing her Garner Nash, DO [facility] 10/16/2019 6:58 AM

## 2019-10-16 NOTE — Progress Notes (Signed)
PCCM:  Thanks for seeing her Garner Nash, DO [facility] 10/16/2019 6:59 AM

## 2019-10-17 ENCOUNTER — Telehealth: Payer: Self-pay | Admitting: Adult Health

## 2019-10-17 DIAGNOSIS — Z79899 Other long term (current) drug therapy: Secondary | ICD-10-CM | POA: Diagnosis not present

## 2019-10-17 DIAGNOSIS — N183 Chronic kidney disease, stage 3 unspecified: Secondary | ICD-10-CM | POA: Diagnosis not present

## 2019-10-17 DIAGNOSIS — R0602 Shortness of breath: Secondary | ICD-10-CM | POA: Diagnosis not present

## 2019-10-17 DIAGNOSIS — Z905 Acquired absence of kidney: Secondary | ICD-10-CM | POA: Diagnosis not present

## 2019-10-17 DIAGNOSIS — M7989 Other specified soft tissue disorders: Secondary | ICD-10-CM | POA: Diagnosis not present

## 2019-10-17 DIAGNOSIS — I5031 Acute diastolic (congestive) heart failure: Secondary | ICD-10-CM | POA: Diagnosis not present

## 2019-10-17 NOTE — Telephone Encounter (Signed)
Tammie [name] sent me a message.  Order states pt wanting to switch from [facility] to [employer].  I will let Tammie [name] know pt is wanting to switch.  Nothing further needed.

## 2019-10-18 ENCOUNTER — Telehealth: Payer: Self-pay | Admitting: Cardiology

## 2019-10-18 LAB — BASIC METABOLIC PANEL
BUN/Creatinine Ratio: 26 (ref 12–28)
BUN: 25 mg/dL (ref 8–27)
CO2: 27 mmol/L (ref 20–29)
Calcium: 10.2 mg/dL (ref 8.7–10.3)
Chloride: 102 mmol/L (ref 96–106)
Creatinine, Ser: 0.96 mg/dL (ref 0.57–1.00)
GFR calc Af Amer: 65 mL/min/{1.73_m2} (ref >59)
GFR calc non Af Amer: 56 mL/min/{1.73_m2} — ABNORMAL LOW (ref >59)
Glucose: 77 mg/dL (ref 65–99)
Potassium: 4.4 mmol/L (ref 3.5–5.2)
Sodium: 142 mmol/L (ref 134–144)

## 2019-10-18 LAB — BRAIN NATRIURETIC PEPTIDE: BNP: 18.7 pg/mL (ref 0.0–100.0)

## 2019-10-18 NOTE — Telephone Encounter (Signed)
I reviewed this patient's chart and labs. Her BNP was not elevated which we would expect if she was volume overloaded.  Her shortness of breath may not be from fluid overload.  Having said that I see no reason why she can't take Lasix 20 mg daily- her kidney function looked good. F/U with Fabian Sharp or Dr Percival Spanish as scheduled.   Kerin Ransom PA-C 10/18/2019 3:12 PM

## 2019-10-18 NOTE — Telephone Encounter (Signed)
Spoke with pt and informed of the following from Kerin Ransom, PA-C:  "I reviewed this patient's chart and labs. Her BNP was not elevated which we would expect if she was volume overloaded.  Her shortness of breath may not be from fluid overload.  Having said that I see no reason why she can't take Lasix 20 mg daily- her kidney function looked good. F/U with Fabian Sharp or Dr Percival Spanish as scheduled."    Pt states that [facility] instructed her to take 20 mg lasix tues, 40 mg weds, 20 mg thurs, and 20 mg fri during last appt 12/1. 12/1 progress notes state the following:  " In instructed 20 mg T, 40 mg Wed, 20 mg Thur and come for BMP and BNP,then 20 mg Fri until she hears from me"  Pt states she will not take additional lasix this weekend unless she notices BLE edema and will take lasix Monday unless otherwise instructed by [facility]

## 2019-10-18 NOTE — Telephone Encounter (Signed)
Spoke with pt who is awaiting lab results and instructions for her Lasix from Dr. Percival Spanish. Informed her that no result note available for her recent lab work, but triage nurse will forward message to Dr. Percival Spanish to advise. She states she is very concerned about kidney health and feels that no one cares about this. Informed pt that message can be sent to an APP as well and as high priority. She requests results to be emailed to her so she can discuss with her nephrologist. Informed pt that results available on mychart. She states she will log in to view, but will await response from Dr. Percival Spanish or APP

## 2019-10-18 NOTE — Telephone Encounter (Signed)
Patient would like to discuss the results from her labs that were done yesterday. She wants to know if she still needs to take her medicine. She is concerned because she only has one kidney, and had labs drawn because other levels were too high

## 2019-10-21 ENCOUNTER — Telehealth: Payer: Self-pay | Admitting: Adult Health

## 2019-10-21 NOTE — Telephone Encounter (Signed)
Discussed her recent lab results and instructions to resume lasix 20mg  M/W/F   Needs ov in 4 weeks for follow up , see OV instrucitons

## 2019-10-21 NOTE — Telephone Encounter (Signed)
I called and spoke with the patient and made her a 4 week return for 11/18/19.

## 2019-10-21 NOTE — Telephone Encounter (Signed)
Call made to patient, before I could verify pt DOB she states " is this Tammie [name]" I responded no and let her know typically the providers are busy and we at least take the message and get it to the provider. She states " you can't help me with anything so just have Tammie [name] call me." I made her aware TP would give her a call.   Will route message to TP.

## 2019-10-24 DIAGNOSIS — N1832 Chronic kidney disease, stage 3b: Secondary | ICD-10-CM | POA: Diagnosis not present

## 2019-10-24 DIAGNOSIS — I10 Essential (primary) hypertension: Secondary | ICD-10-CM | POA: Diagnosis not present

## 2019-10-31 DIAGNOSIS — I5032 Chronic diastolic (congestive) heart failure: Secondary | ICD-10-CM | POA: Insufficient documentation

## 2019-10-31 DIAGNOSIS — Z7189 Other specified counseling: Secondary | ICD-10-CM | POA: Insufficient documentation

## 2019-10-31 DIAGNOSIS — I10 Essential (primary) hypertension: Secondary | ICD-10-CM | POA: Insufficient documentation

## 2019-10-31 NOTE — Progress Notes (Signed)
Cardiology Office Note   Date:  11/01/2019   ID:  KAY BOECK, DOB [birth year], MRN [id number]  PCP:  Carol Ada, MD  Cardiologist:   Minus Breeding, MD   Chief Complaint  Patient presents with  . Shortness of Breath      History of Present Illness: Tammie [name] is a 78 y.o. female who presents for evaluation of acute on chronic diastolic heart failure.   She did call with increased SOB.   She continues to get DOE. She did see pulmonary and had PFTs that were unrevealing.  She had no significant obstruction or restriction.  She has not had improvement with diuretic.   She does have as needed diuretic.  She says she is doing better.  She has had some sinus symptoms.  She is getting some nasal drops which seem to really have improved her breathing.  Her blood pressure is elevated today but she says she is nervous and she has company coming and is making her blood pressure go up.  She says that her blood pressure is usually well controlled at home.  She denies any cardiovascular symptoms. The patient denies any new symptoms such as chest discomfort, neck or arm discomfort. There has been no new shortness of breath, PND or orthopnea. There have been no reported palpitations, presyncope or syncope.   Past Medical History:  Diagnosis Date  . CKD (chronic kidney disease), stage III   . Colon adenoma   . Depression   . Glaucoma   . HTN (hypertension)   . Hypothyroidism   . Obesity   . Obesity hypoventilation syndrome ([place])   . Renal cell cancer ([place])    a. s/p right nephrectomy.    Past Surgical History:  Procedure Laterality Date  . JOINT REPLACEMENT    . KIDNEY SURGERY    . NEPHRECTOMY Right   . ORIF ANKLE FRACTURE Right 09/01/2014   Procedure: OPEN REDUCTION INTERNAL FIXATION (ORIF) ANKLE FRACTURE;  Surgeon: [name], MD;  Location: [facility];  Service: Orthopedics;  Laterality: Right;  . REPLACEMENT TOTAL KNEE    . TOTAL ABDOMINAL HYSTERECTOMY        Current Outpatient Medications  Medication Sig Dispense Refill  . alprazolam (XANAX) 2 MG tablet Take 2 mg by mouth at bedtime.     Marland Kitchen amLODipine (NORVASC) 2.5 MG tablet Take 2.5 mg by mouth daily.     . Ascorbic Acid (VITAMIN C) 500 MG CAPS Take 500 mg by mouth daily.     . Calcium Carb-Cholecalciferol (CALCIUM + D3) 600-200 MG-UNIT TABS Take 1 tablet by mouth 2 (two) times daily.     . carvedilol (COREG) 3.125 MG tablet Take 1 tablet by mouth 2 (two) times daily.    . fluticasone (FLONASE) 50 MCG/ACT nasal spray Place 1 spray into both nostrils 2 (two) times daily as needed for allergies or rhinitis.    . furosemide (LASIX) 20 MG tablet Take 20 mg by mouth 3 (three) times a week. Mondays, Wednesdays, Fridays    . Glucosamine-Chondroitin (GLUCOSAMINE CHONDR COMPLEX PO) Take 1 tablet by mouth 2 (two) times daily.     Marland Kitchen ipratropium (ATROVENT) 0.06 % nasal spray Place 2 sprays into both nostrils 2 (two) times daily as needed.    Marland Kitchen levothyroxine (SYNTHROID, LEVOTHROID) 75 MCG tablet Take 75 mcg by mouth daily before breakfast.    . losartan (COZAAR) 100 MG tablet Take 100 mg by mouth every morning.     . Multiple  Vitamin (MULTI-VITAMINS) TABS Take 1 tablet by mouth daily.     Marland Kitchen omeprazole (PRILOSEC) 20 MG capsule Take 20 mg by mouth daily.     . traZODone (DESYREL) 50 MG tablet Take 1 tablet by mouth at bedtime.     . vitamin B-12 (CYANOCOBALAMIN) 500 MCG tablet Take 500 mcg by mouth daily.     No current facility-administered medications for this visit.    Allergies:   Levaquin [levofloxacin], Nsaids, Hydrocodone-acetaminophen, Oxycodone, and Quinolones    ROS:  Please see the history of present illness.   Otherwise, review of systems are positive for none.   All other systems are reviewed and negative.    PHYSICAL EXAM: VS:  BP (!) 157/93   Pulse 72   Ht 4\' 10"  (1.473 m)   Wt 196 lb (88.9 kg)   SpO2 96%   BMI 40.96 kg/m  , BMI Body mass index is 40.96 kg/m. GENERAL:  Well  appearing NECK:  No jugular venous distention, waveform within normal limits, carotid upstroke brisk and symmetric, no bruits, no thyromegaly LUNGS:  Clear to auscultation bilaterally CHEST:  Unremarkable HEART:  PMI not displaced or sustained,S1 and S2 within normal limits, no S3, no S4, no clicks, no rubs, no murmurs ABD:  Flat, positive bowel sounds normal in frequency in pitch, no bruits, no rebound, no guarding, no midline pulsatile mass, no hepatomegaly, no splenomegaly EXT:  2 plus pulses throughout, no edema, no cyanosis no clubbing   EKG:  EKG is not ordered today.   Recent Labs: 10/17/2019: BNP 18.7; BUN 25; Creatinine, Ser 0.96; Potassium 4.4; Sodium 142    Lipid Panel No results found for: CHOL, TRIG, HDL, CHOLHDL, VLDL, LDLCALC, LDLDIRECT    Wt Readings from Last 3 Encounters:  11/01/19 196 lb (88.9 kg)  10/15/19 191 lb (86.6 kg)  10/15/19 190 lb (86.2 kg)      Other studies Reviewed: Additional studies/ records that were reviewed today include: None. Review of the above records demonstrates:  Please see elsewhere in the note.     ASSESSMENT AND PLAN:  CHRONIC DIASTOLIC DYSFUNCTION:   The patient seems to be euvolemic.  She is breathing a little bit better.  No change in therapy.  ESSENTIAL HTN: She insists that her blood pressure is well controlled at home and wants me to "not worry about it".  For now no change in therapy but she promises she will try to check it.  OBESITY: She understands need to lose weight with diet and education.  COVID EDUCATION: We talked about the vaccine and she is not sure yet that she would get it.  Current medicines are reviewed at length with the patient today.  The patient does not have concerns regarding medicines.  The following changes have been made:  no change  Labs/ tests ordered today include:  No orders of the defined types were placed in this encounter.    Disposition:   FU with me in six months.       Signed, Minus Breeding, MD  11/01/2019 3:08 PM    [facility]

## 2019-11-01 ENCOUNTER — Other Ambulatory Visit: Payer: Self-pay

## 2019-11-01 ENCOUNTER — Ambulatory Visit (INDEPENDENT_AMBULATORY_CARE_PROVIDER_SITE_OTHER): Payer: Medicare Other | Admitting: Cardiology

## 2019-11-01 ENCOUNTER — Encounter: Payer: Self-pay | Admitting: Cardiology

## 2019-11-01 VITALS — BP 157/93 | HR 72 | Ht <= 58 in | Wt 196.0 lb

## 2019-11-01 DIAGNOSIS — I10 Essential (primary) hypertension: Secondary | ICD-10-CM | POA: Diagnosis not present

## 2019-11-01 DIAGNOSIS — I5032 Chronic diastolic (congestive) heart failure: Secondary | ICD-10-CM

## 2019-11-01 DIAGNOSIS — Z7189 Other specified counseling: Secondary | ICD-10-CM

## 2019-11-01 NOTE — Patient Instructions (Signed)
Medication Instructions:  Your physician recommends that you continue on your current medications as directed. Please refer to the Current Medication list given to you today. *If you need a refill on your cardiac medications before your next appointment, please call your pharmacy*  Lab Work: NONE If you have labs (blood work) drawn today and your tests are completely normal, you will receive your results only by: Marland Kitchen MyChart Message (if you have MyChart) OR . A paper copy in the mail If you have any lab test that is abnormal or we need to change your treatment, we will call you to review the results.  Testing/Procedures: NONE  Follow-Up: At [facility], you and your health needs are our priority.  As part of our continuing mission to provide you with exceptional heart care, we have created designated Provider Care Teams.  These Care Teams include your primary Cardiologist (physician) and Advanced Practice Providers (APPs -  Physician Assistants and Nurse Practitioners) who all work together to provide you with the care you need, when you need it.  Your next appointment:   6 month(s)  The format for your next appointment:   Either In Person or Virtual  Provider:   Minus Breeding, MD

## 2019-11-07 IMAGING — US US ABDOMEN COMPLETE
1 series · 13 of 25 positions shown · non-contrast
Comparison: Ultrasound the kidneys of 02/14/2017

CLINICAL DATA: Right-sided abdomen pain for 2-3 months, history of
prior right renal cell carcinoma with right nephrectomy

EXAM:
ABDOMEN ULTRASOUND COMPLETE

[Series 1: us abdomen complete · 0.23mm/px · 13 of 72 slices shown]
[im 1/72]
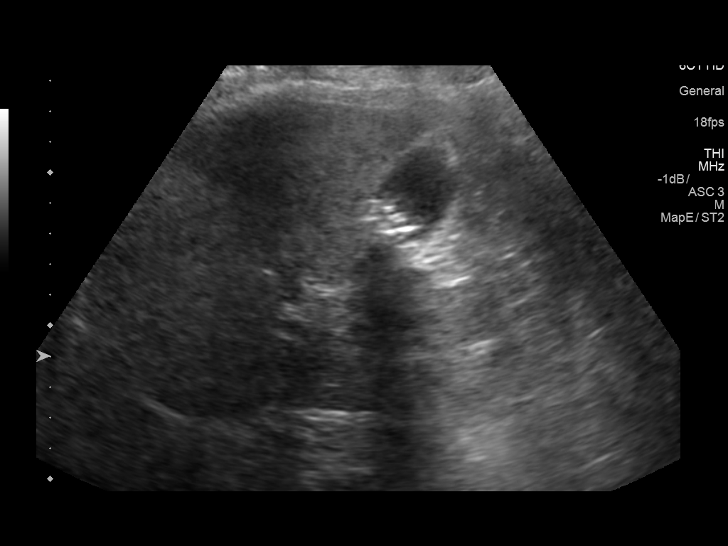
[im 6/72]
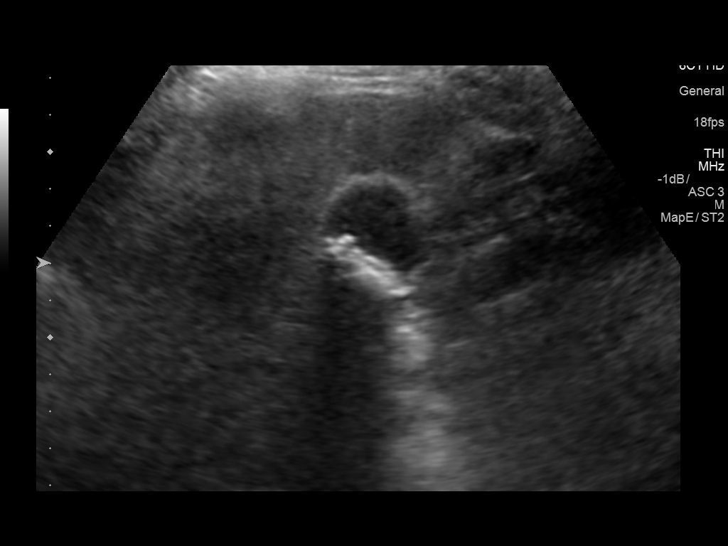
[im 12/72]
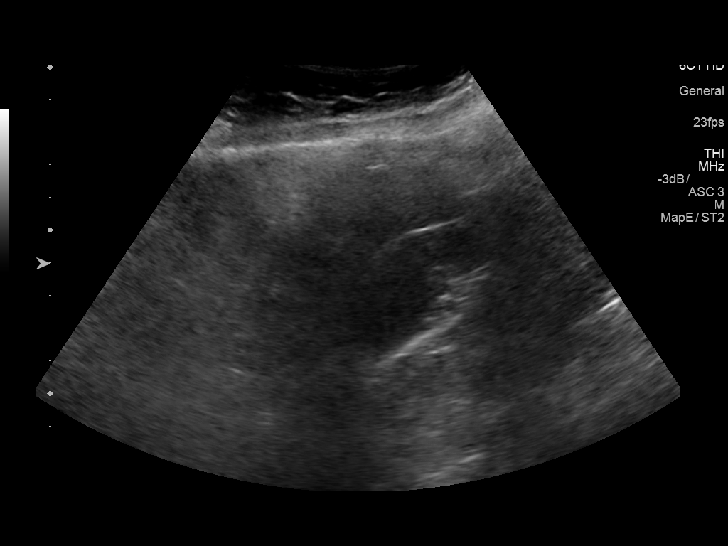
[im 18/72]
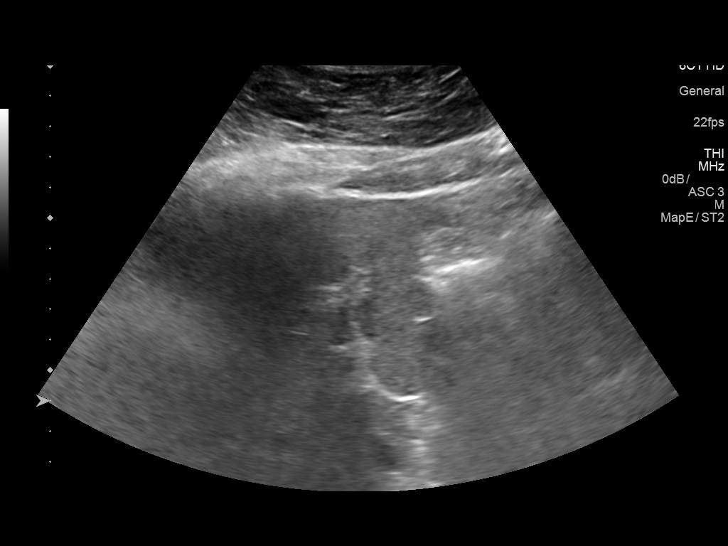
[im 24/72]
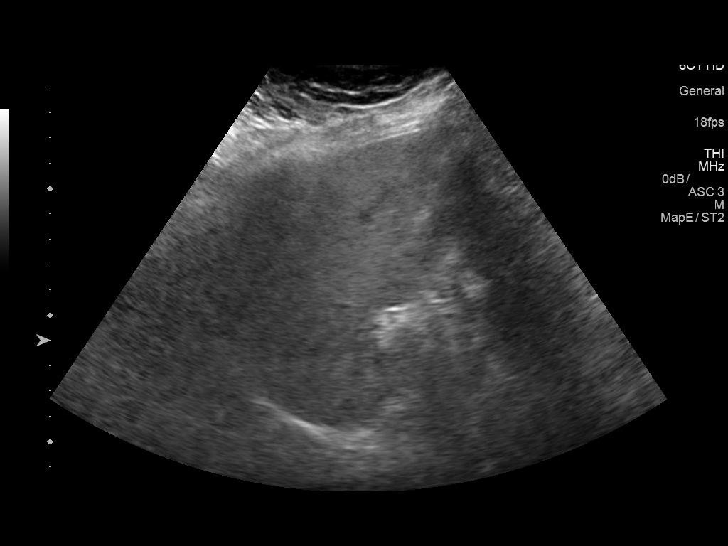
[im 30/72]
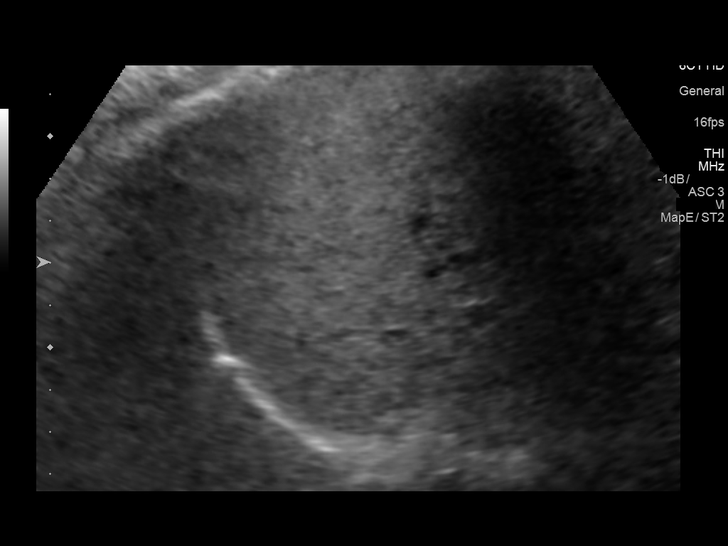
[im 36/72]
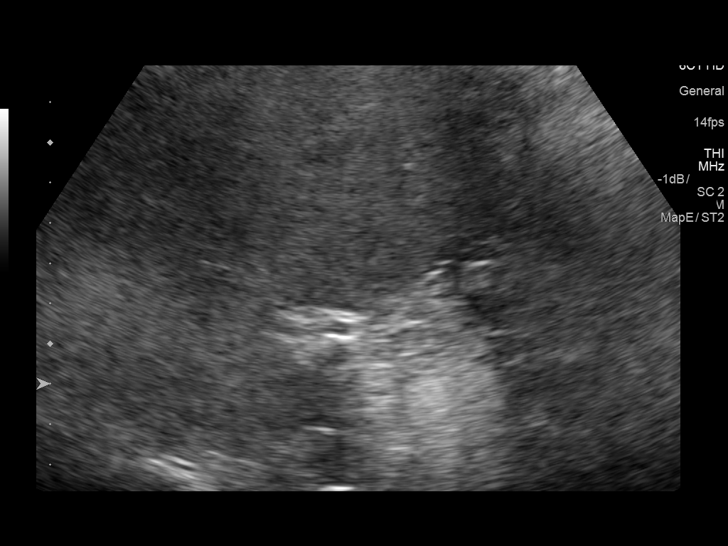
[im 42/72]
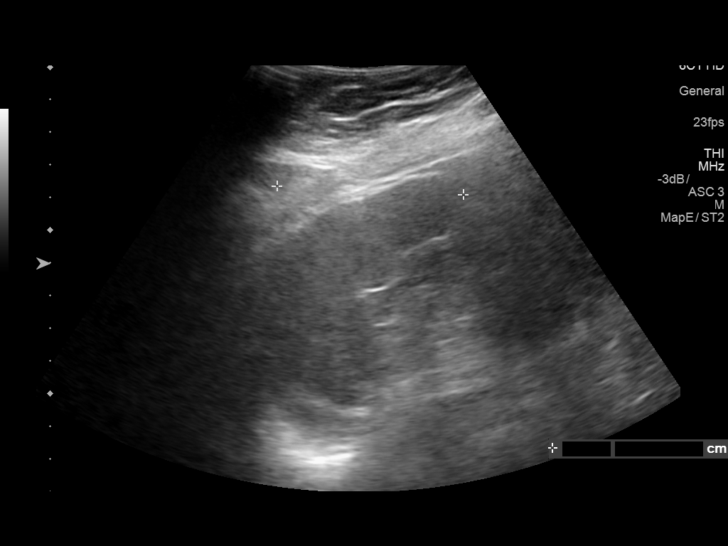
[im 48/72]
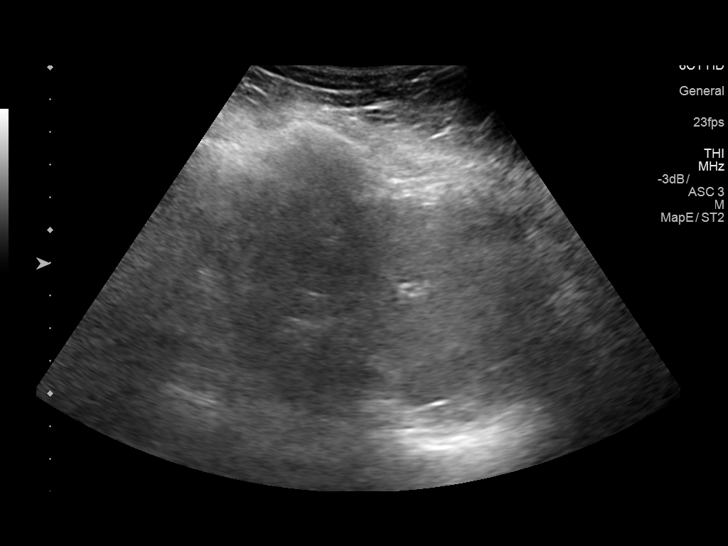
[im 54/72]
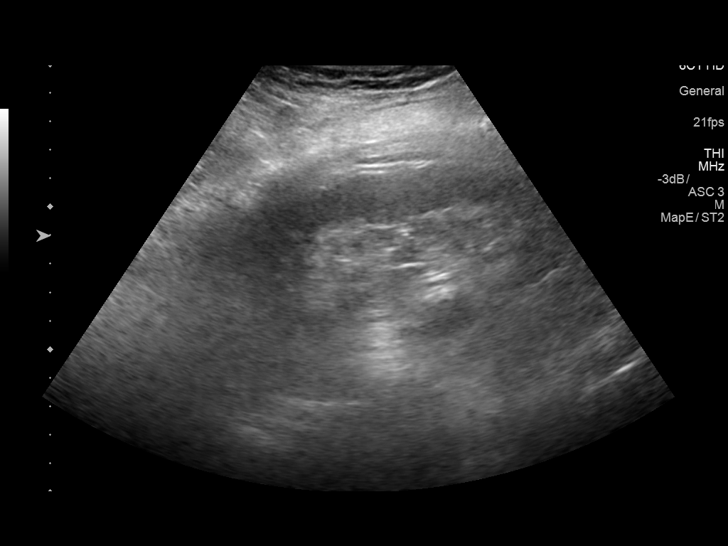
[im 60/72]
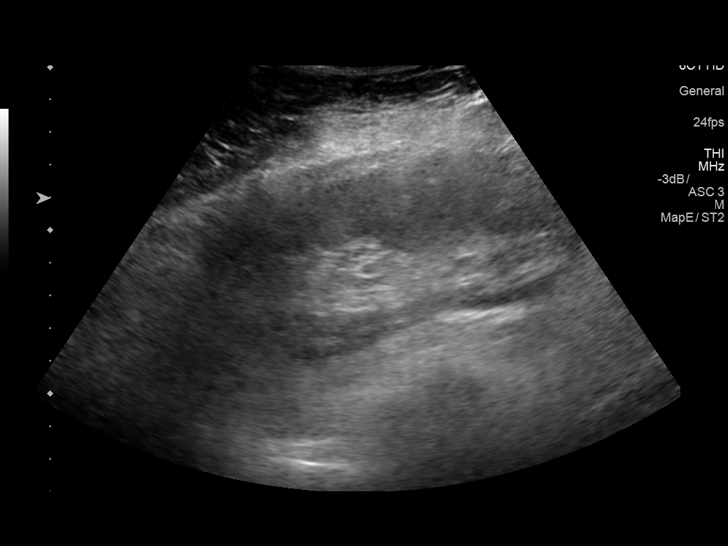
[im 66/72]
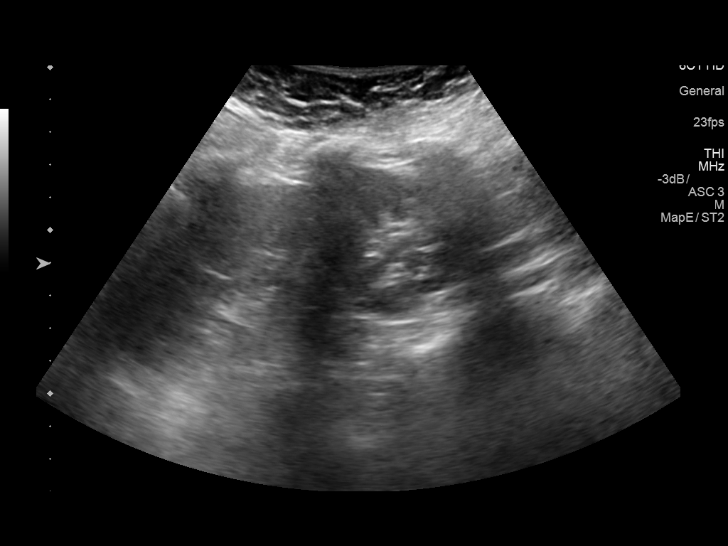
[im 72/72]
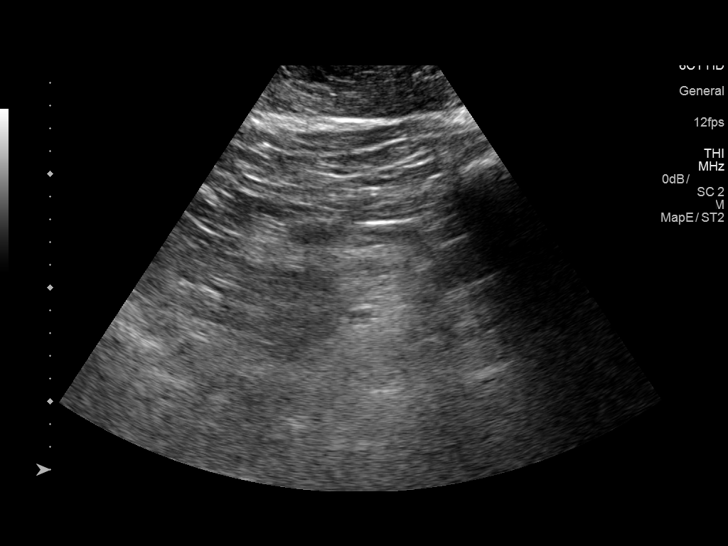

[13 of 25 positions shown; findings below may reference images not displayed]

FINDINGS: Gallbladder: The gallbladder is visualized and there are multiple
small gallstones within the gallbladder, the largest of 6 mm in
diameter. There is no pain over the gallbladder with compression and
the gallbladder wall is not thickened.

Common bile duct: Diameter: The common bile duct is normal measuring
4.2 mm in diameter.

Liver: The parenchyma of the liver is echogenic and inhomogeneous
consistent with diffuse hepatic steatosis. No focal hepatic
abnormality is noted. Portal vein is patent on color Doppler imaging
with normal direction of blood flow towards the liver.

IVC: No abnormality visualized.

Pancreas: The pancreas is completely obscured by bowel gas and
cannot be evaluated.

Spleen: The spleen measures 5.7 cm, with assessment limited as well
by bowel gas.

Right Kidney: Length: The right kidney has previously been
resected..

Left Kidney: Length: 11.8 cm..  No hydronephrosis is seen.

Abdominal aorta: The abdominal aorta is largely obscured by bowel
gas.

Other findings: Much of the anatomy is obscured by bowel gas as
noted above.
IMPRESSION: 1. Multiple small gallstones none larger than 6 mm in diameter. No
pain over the gallbladder.
2. Probable changes of hepatic steatosis.  No focal abnormality.
3. Much of the pancreas and abdominal aorta is obscured by bowel
gas.
4. Prior right nephrectomy.

## 2019-11-18 ENCOUNTER — Encounter: Payer: Self-pay | Admitting: Adult Health

## 2019-11-18 ENCOUNTER — Ambulatory Visit (INDEPENDENT_AMBULATORY_CARE_PROVIDER_SITE_OTHER): Payer: Medicare Other | Admitting: Adult Health

## 2019-11-18 ENCOUNTER — Other Ambulatory Visit: Payer: Self-pay

## 2019-11-18 DIAGNOSIS — R0609 Other forms of dyspnea: Secondary | ICD-10-CM

## 2019-11-18 DIAGNOSIS — R0600 Dyspnea, unspecified: Secondary | ICD-10-CM

## 2019-11-18 DIAGNOSIS — G4734 Idiopathic sleep related nonobstructive alveolar hypoventilation: Secondary | ICD-10-CM

## 2019-11-18 DIAGNOSIS — I5032 Chronic diastolic (congestive) heart failure: Secondary | ICD-10-CM

## 2019-11-18 NOTE — Patient Instructions (Addendum)
Continue on Oxygen 2l/m with activity  Continue on Oxygen 2.5l/m At bedtime   Follow up with Dr. Valeta Harms or Waddell Iten NP in 4-6 weeks and As needed   Please contact office for sooner follow up if symptoms do not improve or worsen or seek emergency care

## 2019-11-18 NOTE — Progress Notes (Signed)
Virtual Visit via Telephone Note  I connected with Tammie [name] on 11/18/19 at  2:30 PM EST by telephone and verified that I am speaking with the correct person using two identifiers.  Location: Patient:Home  Provider: Office    I discussed the limitations, risks, security and privacy concerns of performing an evaluation and management service by telephone and the availability of in person appointments. I also discussed with the patient that there may be a patient responsible charge related to this service. The patient expressed understanding and agreed to proceed.   History of Present Illness: 79 year old female never smoker seen for pulmonary consult September 12, 2019 for ongoing shortness of breath medical history significant for congestive heart failure, stage III chronic kidney disease, morbid obesity and obesity hypoventilation syndrome and renal cell carcinoma status post nephrectomy.  Moderate to severe scoliosis-chronic back pain From [place] speaks excellent English  Today's televisit is a 4-week follow-up for dyspnea.  Patient has been undergoing evaluation for progressive dyspnea with activity and intermittent desaturations with activity.  She has underlying diastolic heart failure.  Patient is on nocturnal oxygen at 2 L.  Recently seen for increased shortness of breath with suspected decompensated diastolic heart failure.  She was recommended to increase Lasix to 20 mg.  Chest x-ray showed no acute process.  Walk test in the office showed some desaturations with ambulation at 85% walking.  She was placed on oxygen 2 L with activity.  And continued on oxygen 2.5 L at bedtime. Previous PFT showed normal lung function with no airflow obstruction or restriction. Since last visit patient is feeling   Observations/Objective:  PFT August 26, 2019 showed FEV1 97%, ratio 86, FVC 82%, no significant bronchodilator response, DLCO 77%.  Care everywhere notes CT chest June 2020 moderate  hiatal hernia, scattered small platelike peripheral opacities suggestive of atelectasis. Stable right lower lobe pulmonary nodule 8.6 mm previously noted in 2008.  Stress Myoview June 2020 [facility] EF 86% normal stress and rest myocardial perfusion images  BNP 08/2019 -nml  Assessment and Plan: Dyspnea -  Chronic dyspnea suspect is multifactoral with underlying chronic D CHF , Deconditioning and morbid obesity/scoliosis may contribute to restrictive process.  Recommend cont w/ oxygen with act and act as tolerated.   Diastolic Heart Failure - appears stable.  Per patient request refer to Cardiology outside of [facility] .   Chronic Respiratory Failure - stable on Oxygen At bedtime  . Now with exertional hypoxemia doing well on oxygen .   Plan  Patient Instructions  Continue on Oxygen 2l/m with activity  Continue on Oxygen 2.5l/m At bedtime   Order for smaller portable concentrator to replace your current one.  Follow up with Dr. Valeta Harms or Nimesh Riolo NP in 4-6 weeks and As needed   Please contact office for sooner follow up if symptoms do not improve or worsen or seek emergency care      Follow Up Instructions: Follow up with Dr. Valeta Harms in 6 weeks and As needed     I discussed the assessment and treatment plan with the patient. The patient was provided an opportunity to ask questions and all were answered. The patient agreed with the plan and demonstrated an understanding of the instructions.   The patient was advised to call back or seek an in-person evaluation if the symptoms worsen or if the condition fails to improve as anticipated.  I provided 25  minutes of non-face-to-face time during this encounter.   Rexene Edison, NP

## 2019-11-19 ENCOUNTER — Telehealth: Payer: Self-pay | Admitting: Adult Health

## 2019-11-19 NOTE — Telephone Encounter (Signed)
[place] has gone for the day, was transferred to her voicemail.  Left detailed message on Beverly's VM informing her that yes, the referral is correct (as noted in the referral) patient would like to switch from her current cardiologist to Dr Einar Gip.  Warner Mccreedy she may call with any other questions/concerns.  Nothing further needed at this time; will sign off.

## 2019-11-26 DIAGNOSIS — E039 Hypothyroidism, unspecified: Secondary | ICD-10-CM | POA: Diagnosis not present

## 2019-11-26 DIAGNOSIS — E7800 Pure hypercholesterolemia, unspecified: Secondary | ICD-10-CM | POA: Diagnosis not present

## 2019-11-26 DIAGNOSIS — I5032 Chronic diastolic (congestive) heart failure: Secondary | ICD-10-CM | POA: Diagnosis not present

## 2019-11-26 DIAGNOSIS — I10 Essential (primary) hypertension: Secondary | ICD-10-CM | POA: Diagnosis not present

## 2019-11-26 DIAGNOSIS — E034 Atrophy of thyroid (acquired): Secondary | ICD-10-CM | POA: Diagnosis not present

## 2019-11-26 DIAGNOSIS — R0602 Shortness of breath: Secondary | ICD-10-CM | POA: Diagnosis not present

## 2019-11-26 DIAGNOSIS — E6609 Other obesity due to excess calories: Secondary | ICD-10-CM | POA: Diagnosis not present

## 2019-11-26 DIAGNOSIS — R7303 Prediabetes: Secondary | ICD-10-CM | POA: Diagnosis not present

## 2019-12-11 DIAGNOSIS — Z23 Encounter for immunization: Secondary | ICD-10-CM | POA: Diagnosis not present

## 2019-12-12 DIAGNOSIS — E039 Hypothyroidism, unspecified: Secondary | ICD-10-CM | POA: Diagnosis not present

## 2019-12-12 DIAGNOSIS — I5032 Chronic diastolic (congestive) heart failure: Secondary | ICD-10-CM | POA: Diagnosis not present

## 2019-12-12 DIAGNOSIS — R7303 Prediabetes: Secondary | ICD-10-CM | POA: Diagnosis not present

## 2019-12-12 DIAGNOSIS — E7800 Pure hypercholesterolemia, unspecified: Secondary | ICD-10-CM | POA: Diagnosis not present

## 2019-12-12 DIAGNOSIS — M810 Age-related osteoporosis without current pathological fracture: Secondary | ICD-10-CM | POA: Diagnosis not present

## 2019-12-16 ENCOUNTER — Ambulatory Visit: Payer: Self-pay | Admitting: Cardiology

## 2019-12-16 NOTE — Progress Notes (Deleted)
Primary Physician/Referring:  Carol Ada, MD  Patient ID: Tammie [name], female    DOB: 12/11/1940, 79 y.o.   MRN: [id number]  No chief complaint on file.  HPI:    Tammie [name]  is a 79 y.o.  Caucasian female with chronic dyspnea, diagnosis of chronic diastolic heart failure, hypertension, stage III chronic kidney disease SP right nephrectomy for renal cell carcinoma, obesity hypoventilation referred to me by Rexene Edison, NP to establish cardiac care.  She was previously evaluated by pulmonary medicine and felt to have no significant obstructive or restrictive component by PFTs.  Patient previously on beta-blocker which was discontinued as she had memory deficit.  She was previously followed by Dr. Minus Breeding for chronic diastolic heart failure. ***  Past Medical History:  Diagnosis Date  . CKD (chronic kidney disease), stage III   . Colon adenoma   . Depression   . Glaucoma   . HTN (hypertension)   . Hypothyroidism   . Obesity   . Obesity hypoventilation syndrome ([place])   . Renal cell cancer ([place])    a. s/p right nephrectomy.   Past Surgical History:  Procedure Laterality Date  . JOINT REPLACEMENT    . KIDNEY SURGERY    . NEPHRECTOMY Right   . ORIF ANKLE FRACTURE Right 09/01/2014   Procedure: OPEN REDUCTION INTERNAL FIXATION (ORIF) ANKLE FRACTURE;  Surgeon: [name], MD;  Location: [facility];  Service: Orthopedics;  Laterality: Right;  . REPLACEMENT TOTAL KNEE    . TOTAL ABDOMINAL HYSTERECTOMY     Social History   Tobacco Use  . Smoking status: Never Smoker  . Smokeless tobacco: Never Used  Substance Use Topics  . Alcohol use: No    ROS  ***ROS Objective  There were no vitals taken for this visit.  Vitals with BMI 11/01/2019 10/15/2019 10/15/2019  Height 4\' 10"  4' 10.5" 4' 10.5"  Weight 196 lbs 191 lbs 190 lbs  BMI 40.98 [id number]  Systolic [id number] [id number]  Diastolic 93 72 67  Pulse 72 73 75     ***Physical Exam Laboratory examination:     Recent Labs    10/17/19 1219  NA 142  K 4.4  CL 102  CO2 27  GLUCOSE 77  BUN 25  CREATININE 0.96  CALCIUM 10.2  GFRNONAA 56*  GFRAA 65   CrCl cannot be calculated (Patient's most recent lab result is older than the maximum 21 days allowed.).  CMP Latest Ref Rng & Units 10/17/2019 08/31/2014 01/06/2010  Glucose 65 - 99 mg/dL 77 107(H) 129(H)  BUN 8 - 27 mg/dL 25 18 14   Creatinine 0.57 - 1.00 mg/dL 0.96 0.99 0.77  Sodium 134 - 144 mmol/L 142 139 134(L)  Potassium 3.5 - 5.2 mmol/L 4.4 4.4 4.5  Chloride 96 - 106 mmol/L 102 100 101  CO2 20 - 29 mmol/L 27 27 30   Calcium 8.7 - 10.3 mg/dL 10.2 9.8 8.3(L)  Total Protein 6.0 - 8.3 g/dL - 7.7 -  Total Bilirubin 0.3 - 1.2 mg/dL - 0.3 -  Alkaline Phos 39 - 117 U/L - 92 -  AST 0 - 37 U/L - 34 -  ALT 0 - 35 U/L - 38(H) -   CBC Latest Ref Rng & Units 08/31/2014 01/06/2010 01/05/2010  WBC 4.0 - 10.5 K/uL 8.6 11.8(H) 12.0(H)  Hemoglobin 12.0 - 15.0 g/dL 14.4 10.0(L) 10.9(L)  Hematocrit 36.0 - 46.0 % 44.9 30.0(L) 32.7(L)  Platelets 150 - 400 K/uL 266 242 254  Lipid Panel  No results found for: CHOL, TRIG, HDL, CHOLHDL, VLDL, LDLCALC, LDLDIRECT HEMOGLOBIN A1C No results found for: HGBA1C, MPG TSH No results for input(s): TSH in the last 8760 hours.  External labs ***:   ***  Medications and allergies   Allergies  Allergen Reactions  . Levaquin [Levofloxacin] Hives  . Nsaids Other (See Comments)    Avoid related to pt has had nephrectomy and advised to not use NSAIDs  . Hydrocodone-Acetaminophen Other (See Comments)    DRY MUCOUS MENBRANES  . Oxycodone Other (See Comments)    DRY MUCOUS MEMBRANES  . Quinolones Rash     Current Outpatient Medications  Medication Instructions  . alprazolam (XANAX) 2 mg, Oral, Daily at bedtime  . amLODipine (NORVASC) 2.5 mg, Oral, Daily  . Calcium Carb-Cholecalciferol (CALCIUM + D3) 600-200 MG-UNIT TABS 1 tablet, 2 times daily  . carvedilol (COREG) 3.125 MG tablet 1 tablet, Oral, 2 times  daily  . fluticasone (FLONASE) 50 MCG/ACT nasal spray 1 spray, Each Nare, 2 times daily PRN  . furosemide (LASIX) 20 mg, Oral, 3 times weekly, Mondays, Wednesdays, Fridays  . Glucosamine-Chondroitin (GLUCOSAMINE CHONDR COMPLEX PO) 1 tablet, 2 times daily  . ipratropium (ATROVENT) 0.06 % nasal spray 2 sprays, Each Nare, 2 times daily PRN  . levothyroxine (SYNTHROID) 75 mcg, Daily before breakfast  . losartan (COZAAR) 100 mg, BH-each morning  . Multiple Vitamin (MULTI-VITAMINS) TABS 1 tablet, Daily  . omeprazole (PRILOSEC) 20 mg, Oral, Daily  . traZODone (DESYREL) 50 MG tablet 1 tablet, Daily at bedtime  . vitamin B-12 (CYANOCOBALAMIN) 500 mcg, Oral, Daily  . Vitamin C 500 mg, Daily    Radiology:  No results found.  Cardiac Studies:   Lexiscan nuclear stress test 03/08/2017: 1. No evidence for ischemia by ST segment analysis on stress ECG.  2. No definite evidence for ischemia or infarction on perfusion images.  Count-poor rest study makes interpretation more difficult.  3. Hyperdynamic LV.   Echocardiogram 03/08/2017: Left ventricle: The cavity size was normal. There was mild focal  basal hypertrophy of the septum. Systolic function was normal.  The estimated ejection fraction was in the range of 60% to 65%.  Wall motion was normal; there were no regional wall motion  abnormalities. Doppler parameters are consistent with abnormal  left ventricular relaxation (grade 1 diastolic dysfunction).  - Right atrium: The atrium was mildly dilated.  Assessment     ICD-10-CM   1. Chronic diastolic heart failure (HCC)  I50.32   2. Benign essential HTN  I10   3. Class 2 severe obesity due to excess calories with serious comorbidity and body mass index (BMI) of 39.0 to 39.9 in adult (HCC)  E66.01    Z68.39     ***  ***  No orders of the defined types were placed in this encounter.   There are no discontinued medications.   Recommendations:   Jessic A Rohner  is a 79 y.o.   Caucasian female with chronic dyspnea, diagnosis of chronic diastolic heart failure, hypertension, stage III chronic kidney disease SP right nephrectomy for renal cell carcinoma, obesity hypoventilation referred to me by Rexene Edison, NP to establish cardiac care.  She was previously evaluated by pulmonary medicine and felt to have no significant obstructive or restrictive component by PFTs,  previously followed by Dr. Minus Breeding.  Patient previously on beta-blocker which was discontinued as she had memory deficit.  ***  Adrian Prows, MD, [facility] 12/16/2019, 1:40 PM [facility]. [place] Office: [phone]

## 2019-12-24 DIAGNOSIS — F419 Anxiety disorder, unspecified: Secondary | ICD-10-CM | POA: Diagnosis not present

## 2019-12-24 DIAGNOSIS — I5032 Chronic diastolic (congestive) heart failure: Secondary | ICD-10-CM | POA: Diagnosis not present

## 2019-12-24 DIAGNOSIS — I10 Essential (primary) hypertension: Secondary | ICD-10-CM | POA: Diagnosis not present

## 2019-12-24 DIAGNOSIS — E039 Hypothyroidism, unspecified: Secondary | ICD-10-CM | POA: Diagnosis not present

## 2019-12-24 DIAGNOSIS — K219 Gastro-esophageal reflux disease without esophagitis: Secondary | ICD-10-CM | POA: Diagnosis not present

## 2019-12-24 DIAGNOSIS — R7303 Prediabetes: Secondary | ICD-10-CM | POA: Diagnosis not present

## 2019-12-24 DIAGNOSIS — E7800 Pure hypercholesterolemia, unspecified: Secondary | ICD-10-CM | POA: Diagnosis not present

## 2019-12-24 DIAGNOSIS — G4734 Idiopathic sleep related nonobstructive alveolar hypoventilation: Secondary | ICD-10-CM | POA: Diagnosis not present

## 2020-01-01 DIAGNOSIS — Z23 Encounter for immunization: Secondary | ICD-10-CM | POA: Diagnosis not present

## 2020-01-14 DIAGNOSIS — J343 Hypertrophy of nasal turbinates: Secondary | ICD-10-CM | POA: Diagnosis not present

## 2020-01-14 DIAGNOSIS — J342 Deviated nasal septum: Secondary | ICD-10-CM | POA: Diagnosis not present

## 2020-01-14 DIAGNOSIS — J310 Chronic rhinitis: Secondary | ICD-10-CM | POA: Diagnosis not present

## 2020-01-29 DIAGNOSIS — R002 Palpitations: Secondary | ICD-10-CM | POA: Diagnosis not present

## 2020-01-29 DIAGNOSIS — R0602 Shortness of breath: Secondary | ICD-10-CM | POA: Diagnosis not present

## 2020-01-29 DIAGNOSIS — E034 Atrophy of thyroid (acquired): Secondary | ICD-10-CM | POA: Diagnosis not present

## 2020-01-29 DIAGNOSIS — I5032 Chronic diastolic (congestive) heart failure: Secondary | ICD-10-CM | POA: Diagnosis not present

## 2020-01-31 ENCOUNTER — Other Ambulatory Visit: Payer: Self-pay

## 2020-01-31 ENCOUNTER — Encounter: Payer: Self-pay | Admitting: Adult Health

## 2020-01-31 ENCOUNTER — Ambulatory Visit (INDEPENDENT_AMBULATORY_CARE_PROVIDER_SITE_OTHER): Payer: Medicare Other | Admitting: Adult Health

## 2020-01-31 ENCOUNTER — Telehealth: Payer: Self-pay | Admitting: Adult Health

## 2020-01-31 DIAGNOSIS — N182 Chronic kidney disease, stage 2 (mild): Secondary | ICD-10-CM

## 2020-01-31 DIAGNOSIS — G4734 Idiopathic sleep related nonobstructive alveolar hypoventilation: Secondary | ICD-10-CM | POA: Diagnosis not present

## 2020-01-31 DIAGNOSIS — R0602 Shortness of breath: Secondary | ICD-10-CM

## 2020-01-31 DIAGNOSIS — I5032 Chronic diastolic (congestive) heart failure: Secondary | ICD-10-CM

## 2020-01-31 NOTE — Progress Notes (Signed)
PCCM: Thanks for seeing her. [facility] 01/31/2020 6:37 PM

## 2020-01-31 NOTE — Telephone Encounter (Signed)
Scheduled televisit 3/19 at 11:30

## 2020-01-31 NOTE — Progress Notes (Signed)
Virtual Visit via Telephone Note  I connected with Tammie [name] on 01/31/20 at 11:30 AM EDT by telephone and verified that I am speaking with the correct person using two identifiers.  Location: Patient: Home  Provider: Office    I discussed the limitations, risks, security and privacy concerns of performing an evaluation and management service by telephone and the availability of in person appointments. I also discussed with the patient that there may be a patient responsible charge related to this service. The patient expressed understanding and agreed to proceed.   History of Present Illness: Female never smoker seen for pulmonary consult September 12, 2019 for ongoing shortness of breath.  Medical history significant for congestive heart failure, stage III chronic kidney disease, morbid obesity and obesity-elation syndrome.  She has a history of renal cell carcinoma status post nephrectomy.  Moderate to severe scoliosis with chronic back pain. She is from [place] and speaks excellent English  Today's televisit is an acute office visit.  Patient complains over the last few days that she has had increased shortness of breath and is retaining fluid.  Patient says that she has been doing excellent fill in the past that she has felt in a long time.  Was very active at home able to do all of her housework and cooking.  She had company this past weekend ate a lot of salty food and is noticed since then that she has been having more shortness of breath with activity.  And has had more fluid retention especially in her hands and feet.  She denies any cough or fever.  She has had both of her Covid vaccines. She is on Lasix 20 mg 3 days a week.  She is followed by cardiology.  She is using oxygen 2 L with activity and at bedtime.  Says her O2 saturations have been very good in the mid to upper 90s.  Observations/Objective: The echo April 2019 EF 123456, grade 1 diastolic dysfunction  PFT August 26, 2019  showed FEV1 97%, ratio 86, FVC 82%, no significant bronchodilator response, DLCO 77%.  Care everywhere notes CT chest June 2020 moderate hiatal hernia, scattered small platelike peripheral opacities suggestive of atelectasis. Stable right lower lobe pulmonary nodule 8.6 mm previously noted in 2008.  Stress Myoview June 2020 [facility] EF 86% normal stress and rest myocardial perfusion images  BNP 08/2019 -nml   Assessment and Plan: Mild diastolic dysfunction flare.  Patient is encouraged on a low-salt diet. We will give gentle diuresis over the next 3 days.  Need to watch carefully as she has chronic kidney disease. We will have close follow-up in 1 week with lab follow-up  Plan  Patient Instructions  Increase Lasix 40mg  daily , then take extra lasix 20mg  Saturday and Sunday then resume normal schedule with Lasix 20mg  Monday /Wednesday /Friday .  Weight daily and keep log  Eat low salt diet  Follow up in 1 week in office with labs and As needed   Please contact office for sooner follow up if symptoms do not improve or worsen or seek emergency care        Follow Up Instructions: Follow-up in 1 week and as needed   I discussed the assessment and treatment plan with the patient. The patient was provided an opportunity to ask questions and all were answered. The patient agreed with the plan and demonstrated an understanding of the instructions.   The patient was advised to call back or seek an  in-person evaluation if the symptoms worsen or if the condition fails to improve as anticipated.  I provided 25 minutes of non-face-to-face time during this encounter.   Rexene Edison, NP

## 2020-01-31 NOTE — Patient Instructions (Addendum)
Increase Lasix 40mg  daily , then take extra lasix 20mg  Saturday and Sunday then resume normal schedule with Lasix 20mg  Monday /Wednesday /Friday .  Weight daily and keep log  Eat low salt diet  Follow up in 1 week in office with labs and As needed   Please contact office for sooner follow up if symptoms do not improve or worsen or seek emergency care

## 2020-02-06 ENCOUNTER — Encounter: Payer: Self-pay | Admitting: Adult Health

## 2020-02-06 ENCOUNTER — Other Ambulatory Visit: Payer: Self-pay

## 2020-02-06 ENCOUNTER — Ambulatory Visit (INDEPENDENT_AMBULATORY_CARE_PROVIDER_SITE_OTHER): Payer: Medicare Other | Admitting: Adult Health

## 2020-02-06 VITALS — BP 140/68 | HR 63 | Temp 97.4°F | Ht <= 58 in | Wt 196.2 lb

## 2020-02-06 DIAGNOSIS — G4734 Idiopathic sleep related nonobstructive alveolar hypoventilation: Secondary | ICD-10-CM

## 2020-02-06 DIAGNOSIS — N182 Chronic kidney disease, stage 2 (mild): Secondary | ICD-10-CM | POA: Diagnosis not present

## 2020-02-06 DIAGNOSIS — R0602 Shortness of breath: Secondary | ICD-10-CM

## 2020-02-06 DIAGNOSIS — I5032 Chronic diastolic (congestive) heart failure: Secondary | ICD-10-CM | POA: Diagnosis not present

## 2020-02-06 LAB — BASIC METABOLIC PANEL
BUN: 19 mg/dL (ref 6–23)
CO2: 29 mEq/L (ref 19–32)
Calcium: 9.4 mg/dL (ref 8.4–10.5)
Chloride: 105 mEq/L (ref 96–112)
Creatinine, Ser: 0.87 mg/dL (ref 0.40–1.20)
GFR: 62.53 mL/min (ref >60.00)
Glucose, Bld: 88 mg/dL (ref 70–99)
Potassium: 4.7 mEq/L (ref 3.5–5.1)
Sodium: 139 mEq/L (ref 135–145)

## 2020-02-06 NOTE — Assessment & Plan Note (Signed)
Continue on nocturnal oxygen at 2 L 

## 2020-02-06 NOTE — Progress Notes (Signed)
_0  ID: Tammie [name], female    DOB: 12/12/1940, 79 y.o.   MRN: [id number]  Chief Complaint  Patient presents with  . Follow-up    CHF     Referring provider: Carol Ada, MD  HPI: 79 year old female never smoker seen for pulmonary consult September 12, 2019 for shortness of breath.. Chronic oxygen 2 L at bedtime Medical history significant for congestive heart failure, stage III chronic kidney disease, morbid obesity and obesity hypoventilation syndrome History of renal cell carcinoma status post nephrectomy.  Moderate to severe scoliosis with chronic back pain Patient is from [place] and speaks excellent English  TEST/EVENTS :  echo April 2019 EF 7322%, grade 1 diastolic dysfunction  PFT August 26, 2019 showed FEV1 97%, ratio 86, FVC 82%, no significant bronchodilator response, DLCO 77%.  Care everywhere notes CT chest June 2020 moderate hiatal hernia, scattered small platelike peripheral opacities suggestive of atelectasis. Stable right lower lobe pulmonary nodule 8.6 mm previously noted in 2008.  Stress Myoview June 2020 [facility] EF 86% normal stress and rest myocardial perfusion images  BNP 08/2019 -nml  02/06/2020 Follow up : Diastolic heart failure, chronic hypoxic respiratory failure on nocturnal oxygen Patient returns for a 1 week follow-up.  Patient last visit was having increased shortness of breath lower extremity swelling, decompensated diastolic heart failure.  She was increased on her Lasix for 3 days and then return back to her baseline Lasix dosing which is 20 mg Monday Wednesday and Friday.  Patient says she is feeling better.  She has decreased leg swelling and shortness of breath is improved Has received both of her Covid vaccines. She has follow-up with cardiology next week. She remains on oxygen at 2 L at bedtime.  Allergies  Allergen Reactions  . Levaquin [Levofloxacin] Hives  . Nsaids Other (See Comments)    Avoid related to pt  has had nephrectomy and advised to not use NSAIDs  . Hydrocodone-Acetaminophen Other (See Comments)    DRY MUCOUS MENBRANES  . Oxycodone Other (See Comments)    DRY MUCOUS MEMBRANES  . Quinolones Rash    Immunization History  Administered Date(s) Administered  . Fluad Quad(high Dose 65+) 07/22/2019  . Influenza Split 09/09/2013, 08/14/2015  . Influenza-Unspecified 08/14/2014, 08/25/2017  . PFIZER SARS-COV-2 Vaccination 12/11/2019, 01/01/2020  . Pneumococcal Polysaccharide-23 09/09/2013    Past Medical History:  Diagnosis Date  . CKD (chronic kidney disease), stage III   . Colon adenoma   . Depression   . Glaucoma   . HTN (hypertension)   . Hypothyroidism   . Obesity   . Obesity hypoventilation syndrome ([place])   . Renal cell cancer ([place])    a. s/p right nephrectomy.    Tobacco History: Social History   Tobacco Use  Smoking Status Never Smoker  Smokeless Tobacco Never Used   Counseling given: Not Answered   Outpatient Medications Prior to Visit  Medication Sig Dispense Refill  . alprazolam (XANAX) 2 MG tablet Take 2 mg by mouth at bedtime.     Marland Kitchen amLODipine (NORVASC) 2.5 MG tablet Take 2.5 mg by mouth daily.     . Ascorbic Acid (VITAMIN C) 500 MG CAPS Take 500 mg by mouth daily.     . Calcium Carb-Cholecalciferol (CALCIUM + D3) 600-200 MG-UNIT TABS Take 1 tablet by mouth 2 (two) times daily.     . carvedilol (COREG) 3.125 MG tablet Take 1 tablet by mouth 2 (two) times daily.    . fluticasone (FLONASE) 50 MCG/ACT nasal spray  Place 1 spray into both nostrils 2 (two) times daily as needed for allergies or rhinitis.    . furosemide (LASIX) 20 MG tablet Take 20 mg by mouth 3 (three) times a week. Mondays, Wednesdays, Fridays    . Glucosamine-Chondroitin (GLUCOSAMINE CHONDR COMPLEX PO) Take 1 tablet by mouth 2 (two) times daily.     Marland Kitchen ipratropium (ATROVENT) 0.06 % nasal spray Place 2 sprays into both nostrils 2 (two) times daily as needed.    Marland Kitchen levothyroxine (SYNTHROID,  LEVOTHROID) 75 MCG tablet Take 75 mcg by mouth daily before breakfast.    . Multiple Vitamin (MULTI-VITAMINS) TABS Take 1 tablet by mouth daily.     Marland Kitchen omeprazole (PRILOSEC) 20 MG capsule Take 20 mg by mouth daily.     Marland Kitchen telmisartan (MICARDIS) 40 MG tablet Take 40 mg by mouth daily.    . traZODone (DESYREL) 50 MG tablet Take 1 tablet by mouth at bedtime.     . vitamin B-12 (CYANOCOBALAMIN) 500 MCG tablet Take 500 mcg by mouth daily.     No facility-administered medications prior to visit.     Review of Systems:   Constitutional:   No  weight loss, night sweats,  Fevers, chills,  +fatigue, or  lassitude.  HEENT:   No headaches,  Difficulty swallowing,  Tooth/dental problems, or  Sore throat,                No sneezing, itching, ear ache, nasal congestion, post nasal drip,   CV:  No chest pain,  Orthopnea, PND, swelling in lower extremities, anasarca, dizziness, palpitations, syncope.   GI  No heartburn, indigestion, abdominal pain, nausea, vomiting, diarrhea, change in bowel habits, loss of appetite, bloody stools.   Resp:  No chest wall deformity  Skin: no rash or lesions.  GU: no dysuria, change in color of urine, no urgency or frequency.  No flank pain, no hematuria   MS:  No joint pain or swelling.  No decreased range of motion.  No back pain.    Physical Exam  BP 140/68 (BP Location: Left Arm, Cuff Size: Normal)   Pulse 63   Temp (!) 97.4 F (36.3 C) (Temporal)   Ht _0  (1.473 m)   Wt 196 lb 3.2 oz (89 kg)   SpO2 92% Comment: RA  BMI 41.01 kg/m   GEN: A/Ox3; pleasant , NAD, BMI 41    HEENT:  [place]/AT,  NOSE-clear, THROAT-clear, no lesions, no postnasal drip or exudate noted.   NECK:  Supple w/ fair ROM; no JVD; normal carotid impulses w/o bruits; no thyromegaly or nodules palpated; no lymphadenopathy.    RESP  Clear  P & A; w/o, wheezes/ rales/ or rhonchi. no accessory muscle use, no dullness to percussion  CARD:  RRR, no m/r/g, tr peripheral edema, pulses  intact, no cyanosis or clubbing.  GI:   Soft & nt; nml bowel sounds; no organomegaly or masses detected.   Musco: Warm bil, no deformities or joint swelling noted.   Neuro: alert, no focal deficits noted.    Skin: Warm, no lesions or rashes    Lab Results:   ProBNP No results found for: PROBNP  Imaging: No results found.    PFT Results Latest Ref Rng & Units 08/26/2019 04/30/2014  FVC-Pre L 1.50 1.61  FVC-Predicted Pre % 83 80  FVC-Post L 1.48 1.63  FVC-Predicted Post % 82 81  Pre FEV1/FVC % % 88 87  Post FEV1/FCV % % 86 89  FEV1-Pre L 1.31 1.40  FEV1-Predicted Pre % 99 94  FEV1-Post L 1.27 1.45  DLCO UNC% % 77 108  DLCO COR %Predicted % 115 150  TLC L 3.43 2.98  TLC % Predicted % 85 74  RV % Predicted % 87 58    No results found for: NITRICOXIDE      Assessment & Plan:   Chronic diastolic heart failure (HCC) Recent mild decompensation now improved with diuresis. Check be met  Plan  Patient Instructions  Labs today .  Continue on Lasix  Follow up with Cardiology as planned.  Weight daily and keep log  Eat low salt diet  Continue on Oxygen At bedtime  .  Follow up in Dr. Valeta Harms 3 months and As needed   Please contact office for sooner follow up if symptoms do not improve or worsen or seek emergency care       Nocturnal hypoxemia Continue on nocturnal oxygen at 2 L  CKD (chronic kidney disease) stage 2, GFR 60-89 ml/min Chronic kidney disease check be met today     Rexene Edison, NP 02/06/2020

## 2020-02-06 NOTE — Assessment & Plan Note (Signed)
Chronic kidney disease check be met today

## 2020-02-06 NOTE — Patient Instructions (Signed)
Labs today .  Continue on Lasix  Follow up with Cardiology as planned.  Weight daily and keep log  Eat low salt diet  Continue on Oxygen At bedtime  .  Follow up in Dr. Valeta Harms 3 months and As needed   Please contact office for sooner follow up if symptoms do not improve or worsen or seek emergency care

## 2020-02-06 NOTE — Assessment & Plan Note (Signed)
Recent mild decompensation now improved with diuresis. Check be met  Plan  Patient Instructions  Labs today .  Continue on Lasix  Follow up with Cardiology as planned.  Weight daily and keep log  Eat low salt diet  Continue on Oxygen At bedtime  .  Follow up in Dr. Valeta Harms 3 months and As needed   Please contact office for sooner follow up if symptoms do not improve or worsen or seek emergency care

## 2020-02-19 DIAGNOSIS — E034 Atrophy of thyroid (acquired): Secondary | ICD-10-CM | POA: Diagnosis not present

## 2020-02-19 DIAGNOSIS — R002 Palpitations: Secondary | ICD-10-CM | POA: Diagnosis not present

## 2020-02-19 DIAGNOSIS — I5032 Chronic diastolic (congestive) heart failure: Secondary | ICD-10-CM | POA: Diagnosis not present

## 2020-02-19 DIAGNOSIS — R0602 Shortness of breath: Secondary | ICD-10-CM | POA: Diagnosis not present

## 2020-02-19 NOTE — Progress Notes (Signed)
PCCM: thanks for seeing her Garner Nash, DO [facility] 02/19/2020 11:45 AM

## 2020-04-24 ENCOUNTER — Telehealth: Payer: Self-pay | Admitting: Adult Health

## 2020-04-24 NOTE — Telephone Encounter (Signed)
[facility] called and patient was confused, she did not own the tanks she had for five years, [facility] took the tanks. She currently has a small POC and a large concentrator for bedtime. Patient wants new back up tanks but she does not want to pay for them. Patient was directed to call [facility] at [phone]. She can also pick up tanks from [facility] in [place], [place]. Patient is being charged approximately 22.00 a month for oxygen fees. She was directed to call [facility] to sort out this issue. She verbalized understanding but she was not happy about the fees from [facility].

## 2020-05-05 DIAGNOSIS — I425 Other restrictive cardiomyopathy: Secondary | ICD-10-CM | POA: Diagnosis not present

## 2020-05-05 DIAGNOSIS — I5032 Chronic diastolic (congestive) heart failure: Secondary | ICD-10-CM | POA: Diagnosis not present

## 2020-05-29 DIAGNOSIS — I110 Hypertensive heart disease with heart failure: Secondary | ICD-10-CM | POA: Diagnosis not present

## 2020-05-29 DIAGNOSIS — I5031 Acute diastolic (congestive) heart failure: Secondary | ICD-10-CM | POA: Diagnosis not present

## 2020-05-29 DIAGNOSIS — R002 Palpitations: Secondary | ICD-10-CM | POA: Diagnosis not present

## 2020-05-29 DIAGNOSIS — Z6841 Body Mass Index (BMI) 40.0 and over, adult: Secondary | ICD-10-CM | POA: Diagnosis not present

## 2020-06-02 DIAGNOSIS — H401434 Capsular glaucoma with pseudoexfoliation of lens, bilateral, indeterminate stage: Secondary | ICD-10-CM | POA: Diagnosis not present

## 2020-06-02 DIAGNOSIS — Z961 Presence of intraocular lens: Secondary | ICD-10-CM | POA: Diagnosis not present

## 2020-06-02 DIAGNOSIS — H02881 Meibomian gland dysfunction right upper eyelid: Secondary | ICD-10-CM | POA: Diagnosis not present

## 2020-06-11 DIAGNOSIS — R7303 Prediabetes: Secondary | ICD-10-CM | POA: Diagnosis not present

## 2020-06-11 DIAGNOSIS — N183 Chronic kidney disease, stage 3 unspecified: Secondary | ICD-10-CM | POA: Diagnosis not present

## 2020-06-11 DIAGNOSIS — R5383 Other fatigue: Secondary | ICD-10-CM | POA: Diagnosis not present

## 2020-06-11 DIAGNOSIS — Z79899 Other long term (current) drug therapy: Secondary | ICD-10-CM | POA: Diagnosis not present

## 2020-06-11 DIAGNOSIS — E559 Vitamin D deficiency, unspecified: Secondary | ICD-10-CM | POA: Diagnosis not present

## 2020-06-11 DIAGNOSIS — R7309 Other abnormal glucose: Secondary | ICD-10-CM | POA: Diagnosis not present

## 2020-06-11 DIAGNOSIS — E039 Hypothyroidism, unspecified: Secondary | ICD-10-CM | POA: Diagnosis not present

## 2020-06-11 DIAGNOSIS — I10 Essential (primary) hypertension: Secondary | ICD-10-CM | POA: Diagnosis not present

## 2020-06-11 DIAGNOSIS — G4734 Idiopathic sleep related nonobstructive alveolar hypoventilation: Secondary | ICD-10-CM | POA: Diagnosis not present

## 2020-06-11 DIAGNOSIS — E7800 Pure hypercholesterolemia, unspecified: Secondary | ICD-10-CM | POA: Diagnosis not present

## 2020-06-11 DIAGNOSIS — M549 Dorsalgia, unspecified: Secondary | ICD-10-CM | POA: Diagnosis not present

## 2020-06-11 DIAGNOSIS — M810 Age-related osteoporosis without current pathological fracture: Secondary | ICD-10-CM | POA: Diagnosis not present

## 2020-06-17 DIAGNOSIS — H00021 Hordeolum internum right upper eyelid: Secondary | ICD-10-CM | POA: Diagnosis not present

## 2020-07-08 ENCOUNTER — Telehealth: Payer: Self-pay | Admitting: Adult Health

## 2020-07-08 DIAGNOSIS — R0602 Shortness of breath: Secondary | ICD-10-CM

## 2020-07-08 DIAGNOSIS — I5032 Chronic diastolic (congestive) heart failure: Secondary | ICD-10-CM

## 2020-07-08 DIAGNOSIS — G4734 Idiopathic sleep related nonobstructive alveolar hypoventilation: Secondary | ICD-10-CM

## 2020-07-08 NOTE — Telephone Encounter (Signed)
Called pt but unable to reach. Left message for her to return call. 

## 2020-07-08 NOTE — Telephone Encounter (Signed)
Called [facility] and spoke with Jinny Blossom to gather more information from her about pt's call. Jinny Blossom stated that pt called and stated that she had a stationary O2 concentrator at home that she has had for 7-10 years that was provided by DME. After 5-year came up in Dec 2020, pt was brought stationary O2 and tanks and pt does not want the tanks. Previous DME brought her only one tank that she never used. Pt wanted equpiment picked up with previous DME.  Pt is wanting to change DME to [facility] for a stationary unit and also for a POC. Pt is eligible to switch DMEs to [facility] due to insurance but Jinny Blossom told her that this usually takes a couple weeks before pt could possibly get the O2 from [facility].  I stated to Jinny Blossom that pt called the office stating that her machine was broken making a funny noise when it is turned on and told Jinny Blossom that pt said someone from [facility] told her it could be the fan and Jinny Blossom stated that pt had spoken with someone in the parts dept and then was transferred to her.   Called and spoke with pt letting her know the info I found out from [place] and asked her if she was aware that it would take about 1-2 weeks before she might be able to receive O2 from [facility] and she verbalized she was told that. I then further spoke with pt about her O2 machine and asked her which DME it came from and she stated that it came from [facility]. Pt told me that [facility] had gone out of business but I told her that they are still in business. I asked pt if she still wanted to switch DMEs from [facility] to [facility] and she stated that she does not want to switch DMEs, she just wants her O2 machine to be fixed. Pt states that she uses 2.5L O2 at night.  Called [facility] ([facility]) and spoke with Yolanda to see if pt is a current pt with them and Denman George stated that pt is no longer an active pt with them. Denman George stated that it looks like pt is now with [facility]. Called [facility] and spoke with Melissa to see if pt is active with [facility] and Melissa stated  that pt does have O2 through them. Stated to [facility] the call I received from pt about her O2 machine being broken and Melissa stated to have pt call them and they could then get more info from her and have someone go out to look her machine.  Called and spoke with pt stating this info to her and she stated that she does not want to stay with [facility] that she does want to switch to [facility] as they will provide her with the stationary machine and also a POC. Stated to pt again to make sure she is aware that it could take up to 1-2 weeks before she might receive O2 from [facility] and she verbalized understanding. Stated to pt that it would not be safe for her to not have a working machine and pt stated that she still wants to switch. Pt said that she does have one tank that she will use at night until she receives the O2 from [facility] as she DOES NOT want [facility] to come out to look at her machine or replace it.  Dr. Valeta Harms, based off of all this info, please advise if you are okay with [place] placing Rx for pt to have DME changed to  [facility] for her O2.

## 2020-07-08 NOTE — Telephone Encounter (Signed)
Ok to [employer] [facility]

## 2020-07-08 NOTE — Telephone Encounter (Signed)
Called and spoke with pt who states that she is needing a new machine and needed prescription sent to [facility]. I asked pt if it was POC and she then told me it was her main unit not a POC.   Pt said when machine is turned on, it starts to make a funny noise. Pt stated that she called [facility] and they told her that the fan on the inside may be broken. Pt stated she called [facility], they told her that they would replace her home concentrator.    Called [facility] at [phone] to try to gather more information from them when pt called them about her machine. Was on hold for 10 minutes without unable to speak with a rep. Will try to call back later.

## 2020-07-09 ENCOUNTER — Telehealth: Payer: Self-pay | Admitting: Adult Health

## 2020-07-09 NOTE — Telephone Encounter (Signed)
I called the patient she meant [facility] states they are faxing a form order was faxed to [facility]

## 2020-07-09 NOTE — Telephone Encounter (Signed)
Order placed for the O2 switch. Called and spoke with pt letting her know this had been done and she verbalized understanding. Nothing further needed.

## 2020-07-10 ENCOUNTER — Telehealth: Payer: Self-pay | Admitting: Pulmonary Disease

## 2020-07-10 NOTE — Telephone Encounter (Signed)
Called and spoke with patient letting her know that order was sent over to [facility] yesterday and that confirmation was received. Patient expressed understanding. Nothing further needed at this time.

## 2020-07-15 DIAGNOSIS — H02834 Dermatochalasis of left upper eyelid: Secondary | ICD-10-CM | POA: Diagnosis not present

## 2020-07-15 DIAGNOSIS — H04123 Dry eye syndrome of bilateral lacrimal glands: Secondary | ICD-10-CM | POA: Diagnosis not present

## 2020-07-15 DIAGNOSIS — H16223 Keratoconjunctivitis sicca, not specified as Sjogren's, bilateral: Secondary | ICD-10-CM | POA: Diagnosis not present

## 2020-07-15 DIAGNOSIS — H02831 Dermatochalasis of right upper eyelid: Secondary | ICD-10-CM | POA: Diagnosis not present

## 2020-08-11 DIAGNOSIS — I5032 Chronic diastolic (congestive) heart failure: Secondary | ICD-10-CM | POA: Diagnosis not present

## 2020-08-11 DIAGNOSIS — E782 Mixed hyperlipidemia: Secondary | ICD-10-CM | POA: Diagnosis not present

## 2020-08-11 DIAGNOSIS — I110 Hypertensive heart disease with heart failure: Secondary | ICD-10-CM | POA: Diagnosis not present

## 2020-08-11 DIAGNOSIS — Z23 Encounter for immunization: Secondary | ICD-10-CM | POA: Diagnosis not present

## 2020-08-18 DIAGNOSIS — R058 Other specified cough: Secondary | ICD-10-CM | POA: Diagnosis not present

## 2020-08-23 DIAGNOSIS — R110 Nausea: Secondary | ICD-10-CM | POA: Diagnosis not present

## 2020-08-23 DIAGNOSIS — R42 Dizziness and giddiness: Secondary | ICD-10-CM | POA: Diagnosis not present

## 2020-08-23 DIAGNOSIS — R0902 Hypoxemia: Secondary | ICD-10-CM | POA: Diagnosis not present

## 2020-08-24 DIAGNOSIS — R112 Nausea with vomiting, unspecified: Secondary | ICD-10-CM | POA: Diagnosis not present

## 2020-08-24 DIAGNOSIS — Z20822 Contact with and (suspected) exposure to covid-19: Secondary | ICD-10-CM | POA: Diagnosis not present

## 2020-08-24 DIAGNOSIS — R2689 Other abnormalities of gait and mobility: Secondary | ICD-10-CM | POA: Diagnosis not present

## 2020-08-24 DIAGNOSIS — R5383 Other fatigue: Secondary | ICD-10-CM | POA: Diagnosis not present

## 2020-08-24 DIAGNOSIS — K449 Diaphragmatic hernia without obstruction or gangrene: Secondary | ICD-10-CM | POA: Diagnosis not present

## 2020-08-24 DIAGNOSIS — N183 Chronic kidney disease, stage 3 unspecified: Secondary | ICD-10-CM | POA: Diagnosis not present

## 2020-08-24 DIAGNOSIS — R918 Other nonspecific abnormal finding of lung field: Secondary | ICD-10-CM | POA: Diagnosis not present

## 2020-08-24 DIAGNOSIS — I130 Hypertensive heart and chronic kidney disease with heart failure and stage 1 through stage 4 chronic kidney disease, or unspecified chronic kidney disease: Secondary | ICD-10-CM | POA: Diagnosis not present

## 2020-08-24 DIAGNOSIS — Z905 Acquired absence of kidney: Secondary | ICD-10-CM | POA: Diagnosis not present

## 2020-08-24 DIAGNOSIS — R0789 Other chest pain: Secondary | ICD-10-CM | POA: Diagnosis not present

## 2020-08-24 DIAGNOSIS — I509 Heart failure, unspecified: Secondary | ICD-10-CM | POA: Diagnosis not present

## 2020-08-24 DIAGNOSIS — R519 Headache, unspecified: Secondary | ICD-10-CM | POA: Diagnosis not present

## 2020-08-24 DIAGNOSIS — G9389 Other specified disorders of brain: Secondary | ICD-10-CM | POA: Diagnosis not present

## 2020-08-24 DIAGNOSIS — R110 Nausea: Secondary | ICD-10-CM | POA: Diagnosis not present

## 2020-08-24 DIAGNOSIS — H6123 Impacted cerumen, bilateral: Secondary | ICD-10-CM | POA: Diagnosis not present

## 2020-08-24 DIAGNOSIS — R42 Dizziness and giddiness: Secondary | ICD-10-CM | POA: Diagnosis not present

## 2020-08-24 DIAGNOSIS — R109 Unspecified abdominal pain: Secondary | ICD-10-CM | POA: Diagnosis not present

## 2020-08-24 DIAGNOSIS — Z08 Encounter for follow-up examination after completed treatment for malignant neoplasm: Secondary | ICD-10-CM | POA: Diagnosis not present

## 2020-08-24 DIAGNOSIS — G939 Disorder of brain, unspecified: Secondary | ICD-10-CM | POA: Diagnosis not present

## 2020-08-24 DIAGNOSIS — R059 Cough, unspecified: Secondary | ICD-10-CM | POA: Diagnosis not present

## 2020-08-24 DIAGNOSIS — R0602 Shortness of breath: Secondary | ICD-10-CM | POA: Diagnosis not present

## 2020-08-24 DIAGNOSIS — R9431 Abnormal electrocardiogram [ECG] [EKG]: Secondary | ICD-10-CM | POA: Diagnosis not present

## 2020-08-25 DIAGNOSIS — R519 Headache, unspecified: Secondary | ICD-10-CM | POA: Diagnosis not present

## 2020-08-25 DIAGNOSIS — R42 Dizziness and giddiness: Secondary | ICD-10-CM | POA: Diagnosis not present

## 2020-08-31 DIAGNOSIS — R42 Dizziness and giddiness: Secondary | ICD-10-CM | POA: Diagnosis not present

## 2020-08-31 DIAGNOSIS — M160 Bilateral primary osteoarthritis of hip: Secondary | ICD-10-CM | POA: Diagnosis not present

## 2020-08-31 DIAGNOSIS — J40 Bronchitis, not specified as acute or chronic: Secondary | ICD-10-CM | POA: Diagnosis not present

## 2020-09-03 DIAGNOSIS — S8002XA Contusion of left knee, initial encounter: Secondary | ICD-10-CM | POA: Diagnosis not present

## 2020-09-03 DIAGNOSIS — Z0189 Encounter for other specified special examinations: Secondary | ICD-10-CM | POA: Diagnosis not present

## 2020-09-03 DIAGNOSIS — M5136 Other intervertebral disc degeneration, lumbar region: Secondary | ICD-10-CM | POA: Diagnosis not present

## 2020-09-03 DIAGNOSIS — H6121 Impacted cerumen, right ear: Secondary | ICD-10-CM | POA: Diagnosis not present

## 2020-09-03 DIAGNOSIS — H903 Sensorineural hearing loss, bilateral: Secondary | ICD-10-CM | POA: Diagnosis not present

## 2020-09-03 DIAGNOSIS — H8120 Vestibular neuronitis, unspecified ear: Secondary | ICD-10-CM | POA: Diagnosis not present

## 2020-09-08 DIAGNOSIS — M5440 Lumbago with sciatica, unspecified side: Secondary | ICD-10-CM | POA: Diagnosis not present

## 2020-09-15 DIAGNOSIS — M5136 Other intervertebral disc degeneration, lumbar region: Secondary | ICD-10-CM | POA: Diagnosis not present

## 2020-09-15 DIAGNOSIS — G8929 Other chronic pain: Secondary | ICD-10-CM | POA: Diagnosis not present

## 2020-10-06 DIAGNOSIS — I10 Essential (primary) hypertension: Secondary | ICD-10-CM | POA: Diagnosis not present

## 2020-10-06 DIAGNOSIS — Z6841 Body Mass Index (BMI) 40.0 and over, adult: Secondary | ICD-10-CM | POA: Diagnosis not present

## 2020-10-06 DIAGNOSIS — M47816 Spondylosis without myelopathy or radiculopathy, lumbar region: Secondary | ICD-10-CM | POA: Diagnosis not present

## 2020-10-13 DIAGNOSIS — I5030 Unspecified diastolic (congestive) heart failure: Secondary | ICD-10-CM | POA: Diagnosis not present

## 2020-10-13 DIAGNOSIS — L603 Nail dystrophy: Secondary | ICD-10-CM | POA: Diagnosis not present

## 2020-10-13 DIAGNOSIS — M810 Age-related osteoporosis without current pathological fracture: Secondary | ICD-10-CM | POA: Diagnosis not present

## 2020-10-13 DIAGNOSIS — E039 Hypothyroidism, unspecified: Secondary | ICD-10-CM | POA: Diagnosis not present

## 2020-10-13 DIAGNOSIS — G8929 Other chronic pain: Secondary | ICD-10-CM | POA: Diagnosis not present

## 2020-10-13 DIAGNOSIS — I130 Hypertensive heart and chronic kidney disease with heart failure and stage 1 through stage 4 chronic kidney disease, or unspecified chronic kidney disease: Secondary | ICD-10-CM | POA: Diagnosis not present

## 2020-10-13 DIAGNOSIS — R638 Other symptoms and signs concerning food and fluid intake: Secondary | ICD-10-CM | POA: Diagnosis not present

## 2020-10-13 DIAGNOSIS — M545 Low back pain, unspecified: Secondary | ICD-10-CM | POA: Diagnosis not present

## 2020-10-13 DIAGNOSIS — Z6838 Body mass index (BMI) 38.0-38.9, adult: Secondary | ICD-10-CM | POA: Diagnosis not present

## 2020-10-13 DIAGNOSIS — N1831 Chronic kidney disease, stage 3a: Secondary | ICD-10-CM | POA: Diagnosis not present

## 2020-10-13 DIAGNOSIS — E662 Morbid (severe) obesity with alveolar hypoventilation: Secondary | ICD-10-CM | POA: Diagnosis not present

## 2020-10-13 DIAGNOSIS — F419 Anxiety disorder, unspecified: Secondary | ICD-10-CM | POA: Diagnosis not present

## 2020-10-15 DIAGNOSIS — I10 Essential (primary) hypertension: Secondary | ICD-10-CM | POA: Diagnosis not present

## 2020-10-15 DIAGNOSIS — Z905 Acquired absence of kidney: Secondary | ICD-10-CM | POA: Diagnosis not present

## 2020-11-19 DIAGNOSIS — C641 Malignant neoplasm of right kidney, except renal pelvis: Secondary | ICD-10-CM | POA: Diagnosis not present

## 2020-11-19 DIAGNOSIS — E662 Morbid (severe) obesity with alveolar hypoventilation: Secondary | ICD-10-CM | POA: Diagnosis not present

## 2020-11-19 DIAGNOSIS — Z6841 Body Mass Index (BMI) 40.0 and over, adult: Secondary | ICD-10-CM | POA: Diagnosis not present

## 2020-11-19 DIAGNOSIS — N1831 Chronic kidney disease, stage 3a: Secondary | ICD-10-CM | POA: Diagnosis not present

## 2020-11-19 DIAGNOSIS — I10 Essential (primary) hypertension: Secondary | ICD-10-CM | POA: Diagnosis not present

## 2020-11-30 DIAGNOSIS — Z20822 Contact with and (suspected) exposure to covid-19: Secondary | ICD-10-CM | POA: Diagnosis not present

## 2020-11-30 DIAGNOSIS — J3489 Other specified disorders of nose and nasal sinuses: Secondary | ICD-10-CM | POA: Diagnosis not present

## 2020-11-30 DIAGNOSIS — Z6831 Body mass index (BMI) 31.0-31.9, adult: Secondary | ICD-10-CM | POA: Diagnosis not present

## 2020-12-10 DIAGNOSIS — R42 Dizziness and giddiness: Secondary | ICD-10-CM | POA: Diagnosis not present

## 2020-12-10 DIAGNOSIS — D320 Benign neoplasm of cerebral meninges: Secondary | ICD-10-CM | POA: Diagnosis not present

## 2020-12-11 DIAGNOSIS — D329 Benign neoplasm of meninges, unspecified: Secondary | ICD-10-CM | POA: Diagnosis not present

## 2020-12-14 ENCOUNTER — Telehealth: Payer: Self-pay | Admitting: Pulmonary Disease

## 2020-12-14 NOTE — Telephone Encounter (Signed)
I checked Dr Fabio Bering papers and see nothing on this pt. Called [facility]. Was advised they are faxing to Tammy Parrett's attn. Still do not see it so they are going to refax to the [phone] #. Will await fax.

## 2020-12-15 NOTE — Telephone Encounter (Signed)
No form as of yet 12/15/20

## 2020-12-17 DIAGNOSIS — M810 Age-related osteoporosis without current pathological fracture: Secondary | ICD-10-CM | POA: Diagnosis not present

## 2020-12-18 NOTE — Telephone Encounter (Signed)
Called [facility] and spoke to Mignon and was advised they have already received the form and has been sent back to [facility] and accepted. Nothing further is needed at this time.

## 2021-03-15 DIAGNOSIS — L720 Epidermal cyst: Secondary | ICD-10-CM | POA: Diagnosis not present

## 2021-04-13 DIAGNOSIS — I5030 Unspecified diastolic (congestive) heart failure: Secondary | ICD-10-CM | POA: Diagnosis not present

## 2021-04-13 DIAGNOSIS — I110 Hypertensive heart disease with heart failure: Secondary | ICD-10-CM | POA: Diagnosis not present

## 2021-04-13 DIAGNOSIS — Z6841 Body Mass Index (BMI) 40.0 and over, adult: Secondary | ICD-10-CM | POA: Diagnosis not present

## 2021-04-15 DIAGNOSIS — F5101 Primary insomnia: Secondary | ICD-10-CM | POA: Diagnosis not present

## 2021-04-15 DIAGNOSIS — N183 Chronic kidney disease, stage 3 unspecified: Secondary | ICD-10-CM | POA: Diagnosis not present

## 2021-04-15 DIAGNOSIS — E039 Hypothyroidism, unspecified: Secondary | ICD-10-CM | POA: Diagnosis not present

## 2021-04-15 DIAGNOSIS — I10 Essential (primary) hypertension: Secondary | ICD-10-CM | POA: Diagnosis not present

## 2021-04-15 DIAGNOSIS — K219 Gastro-esophageal reflux disease without esophagitis: Secondary | ICD-10-CM | POA: Diagnosis not present

## 2021-04-15 DIAGNOSIS — G4734 Idiopathic sleep related nonobstructive alveolar hypoventilation: Secondary | ICD-10-CM | POA: Diagnosis not present

## 2021-04-15 DIAGNOSIS — F3341 Major depressive disorder, recurrent, in partial remission: Secondary | ICD-10-CM | POA: Diagnosis not present

## 2021-04-15 DIAGNOSIS — R7303 Prediabetes: Secondary | ICD-10-CM | POA: Diagnosis not present

## 2021-04-15 DIAGNOSIS — M791 Myalgia, unspecified site: Secondary | ICD-10-CM | POA: Diagnosis not present

## 2021-04-19 DIAGNOSIS — Z5181 Encounter for therapeutic drug level monitoring: Secondary | ICD-10-CM | POA: Diagnosis not present

## 2021-04-19 DIAGNOSIS — E669 Obesity, unspecified: Secondary | ICD-10-CM | POA: Diagnosis not present

## 2021-04-19 DIAGNOSIS — L821 Other seborrheic keratosis: Secondary | ICD-10-CM | POA: Diagnosis not present

## 2021-04-19 DIAGNOSIS — N1831 Chronic kidney disease, stage 3a: Secondary | ICD-10-CM | POA: Diagnosis not present

## 2021-04-19 DIAGNOSIS — I129 Hypertensive chronic kidney disease with stage 1 through stage 4 chronic kidney disease, or unspecified chronic kidney disease: Secondary | ICD-10-CM | POA: Diagnosis not present

## 2021-04-19 DIAGNOSIS — E039 Hypothyroidism, unspecified: Secondary | ICD-10-CM | POA: Diagnosis not present

## 2021-04-19 DIAGNOSIS — Z6841 Body Mass Index (BMI) 40.0 and over, adult: Secondary | ICD-10-CM | POA: Diagnosis not present

## 2021-04-20 DIAGNOSIS — H401494 Capsular glaucoma with pseudoexfoliation of lens, unspecified eye, indeterminate stage: Secondary | ICD-10-CM | POA: Diagnosis not present

## 2021-04-21 DIAGNOSIS — L821 Other seborrheic keratosis: Secondary | ICD-10-CM | POA: Diagnosis not present

## 2021-04-21 DIAGNOSIS — Z7189 Other specified counseling: Secondary | ICD-10-CM | POA: Diagnosis not present

## 2022-03-03 ENCOUNTER — Telehealth: Payer: Self-pay | Admitting: Adult Health

## 2022-03-03 NOTE — Telephone Encounter (Signed)
Please try to call patient she has not been seen in greater 2 years.  I will try to give her a call back once we understand what this is pertaining to later today or tomorrow. ?

## 2022-03-03 NOTE — Telephone Encounter (Signed)
Called and spoke with pt. Pt is requesting to have TP call her personally so she can speak with her about her health. ? ?Routing to [employer]. Please advise ?

## 2022-03-04 NOTE — Telephone Encounter (Signed)
I called and spoke with patient regarding the nature of why she needed to speak with Rexene Edison NP.  She states she was recently in the hospital in [facility] with pna and bronchitis and was told that her CO2 levels were high and they wanted her to do a test and she was not sure about doing the test.  The test is to be done on Apr 07, 2022 and she is going to [place] for her grandson's wedding and cannot do it then.  She also does not trust the doctors like she does Tammy.  She thinks that if the high CO2 levels are remaining high it should not wait until 5/25.  I advised her that it can take some time to get a HST done depending on how may people need one done.  From the notes from the initial consult with the pulmonary doctor it looks like they want to do a HST.  In the consult notes, it states she has hypoventilation syndrome.  She was put on 2L of oxygen at night when she was seen in our office last.  She wanted to schedule an appointment with Tammy, I scheduled her to see Tammy on 04/07/22 at 3 pm.  I advised her to arrive by 2:45 pm.   She is currently living in [place], [place].  I got her updated address while I had her on the phone. ? ?Tammy, this is the patient that called yesterday and wanted to talk to you.  I scheduled her to see you on 03/14/22 to discuss having a HST and her high CO2 levels. ?

## 2022-03-04 NOTE — Telephone Encounter (Signed)
Great I will see her then . If she needs to come in sooner can work in . If still needs to call I can call her.  ?

## 2022-03-04 NOTE — Telephone Encounter (Signed)
She seemed to be good with May 1st.  She did not request to talk to you once I talked to her and scheduled her appointment with you. ?

## 2022-03-14 ENCOUNTER — Ambulatory Visit: Payer: Medicare Other | Admitting: Adult Health
